# Patient Record
Sex: Female | Born: 1970 | Race: White | Hispanic: No | Marital: Single | Smoking: Former smoker
Health system: Southern US, Community
[De-identification: ages and names within clinical notes are randomized; demographics above are authoritative.]

## PROBLEM LIST (undated history)

## (undated) DIAGNOSIS — R51 Headache: Secondary | ICD-10-CM

## (undated) DIAGNOSIS — F319 Bipolar disorder, unspecified: Secondary | ICD-10-CM

## (undated) DIAGNOSIS — F329 Major depressive disorder, single episode, unspecified: Secondary | ICD-10-CM

## (undated) DIAGNOSIS — G8929 Other chronic pain: Secondary | ICD-10-CM

## (undated) DIAGNOSIS — R519 Headache, unspecified: Secondary | ICD-10-CM

## (undated) DIAGNOSIS — M1990 Unspecified osteoarthritis, unspecified site: Secondary | ICD-10-CM

## (undated) DIAGNOSIS — F419 Anxiety disorder, unspecified: Secondary | ICD-10-CM

## (undated) DIAGNOSIS — M549 Dorsalgia, unspecified: Secondary | ICD-10-CM

## (undated) DIAGNOSIS — F32A Depression, unspecified: Secondary | ICD-10-CM

## (undated) DIAGNOSIS — F1910 Other psychoactive substance abuse, uncomplicated: Secondary | ICD-10-CM

## (undated) HISTORY — PX: ARTHROSCOPIC REPAIR PCL: SUR81

## (undated) HISTORY — PX: DENTAL SURGERY: SHX609

## (undated) HISTORY — PX: NASAL SEPTUM SURGERY: SHX37

---

## 1999-12-07 ENCOUNTER — Encounter: Payer: Self-pay | Admitting: Emergency Medicine

## 1999-12-07 ENCOUNTER — Emergency Department (HOSPITAL_COMMUNITY): Admission: EM | Admit: 1999-12-07 | Discharge: 1999-12-07 | Payer: Self-pay | Admitting: Emergency Medicine

## 2000-02-16 ENCOUNTER — Other Ambulatory Visit: Admission: RE | Admit: 2000-02-16 | Discharge: 2000-02-16 | Payer: Self-pay | Admitting: *Deleted

## 2000-07-10 ENCOUNTER — Other Ambulatory Visit: Admission: RE | Admit: 2000-07-10 | Discharge: 2000-07-10 | Payer: Self-pay | Admitting: *Deleted

## 2000-11-15 ENCOUNTER — Observation Stay (HOSPITAL_COMMUNITY): Admission: EM | Admit: 2000-11-15 | Discharge: 2000-11-16 | Payer: Self-pay | Admitting: Emergency Medicine

## 2001-02-07 ENCOUNTER — Other Ambulatory Visit (HOSPITAL_COMMUNITY): Admission: RE | Admit: 2001-02-07 | Discharge: 2001-02-11 | Payer: Self-pay | Admitting: Psychiatry

## 2001-02-08 ENCOUNTER — Emergency Department (HOSPITAL_COMMUNITY): Admission: EM | Admit: 2001-02-08 | Discharge: 2001-02-08 | Payer: Self-pay | Admitting: Emergency Medicine

## 2002-02-27 ENCOUNTER — Other Ambulatory Visit: Admission: RE | Admit: 2002-02-27 | Discharge: 2002-02-27 | Payer: Self-pay | Admitting: Family Medicine

## 2003-04-22 ENCOUNTER — Other Ambulatory Visit: Admission: RE | Admit: 2003-04-22 | Discharge: 2003-04-22 | Payer: Self-pay | Admitting: Family Medicine

## 2004-05-25 ENCOUNTER — Other Ambulatory Visit: Admission: RE | Admit: 2004-05-25 | Discharge: 2004-05-25 | Payer: Self-pay | Admitting: Family Medicine

## 2005-05-29 ENCOUNTER — Other Ambulatory Visit: Admission: RE | Admit: 2005-05-29 | Discharge: 2005-05-29 | Payer: Self-pay | Admitting: Family Medicine

## 2006-10-01 ENCOUNTER — Other Ambulatory Visit: Admission: RE | Admit: 2006-10-01 | Discharge: 2006-10-01 | Payer: Self-pay | Admitting: Family Medicine

## 2006-11-04 ENCOUNTER — Emergency Department (HOSPITAL_COMMUNITY): Admission: EM | Admit: 2006-11-04 | Discharge: 2006-11-04 | Payer: Self-pay | Admitting: Family Medicine

## 2007-12-05 ENCOUNTER — Emergency Department (HOSPITAL_COMMUNITY): Admission: EM | Admit: 2007-12-05 | Discharge: 2007-12-06 | Payer: Self-pay | Admitting: Emergency Medicine

## 2008-04-14 ENCOUNTER — Other Ambulatory Visit: Admission: RE | Admit: 2008-04-14 | Discharge: 2008-04-14 | Payer: Self-pay | Admitting: Family Medicine

## 2008-07-10 ENCOUNTER — Emergency Department (HOSPITAL_COMMUNITY): Admission: EM | Admit: 2008-07-10 | Discharge: 2008-07-10 | Payer: Self-pay | Admitting: Emergency Medicine

## 2009-06-21 ENCOUNTER — Other Ambulatory Visit: Admission: RE | Admit: 2009-06-21 | Discharge: 2009-06-21 | Payer: Self-pay | Admitting: Family Medicine

## 2010-02-04 ENCOUNTER — Emergency Department (HOSPITAL_COMMUNITY): Admission: EM | Admit: 2010-02-04 | Discharge: 2010-02-04 | Payer: Self-pay | Admitting: Emergency Medicine

## 2010-10-08 LAB — COMPREHENSIVE METABOLIC PANEL
ALT: 15 U/L (ref 0–35)
AST: 20 U/L (ref 0–37)
Albumin: 4.4 g/dL (ref 3.5–5.2)
Alkaline Phosphatase: 40 U/L (ref 39–117)
BUN: 15 mg/dL (ref 6–23)
CO2: 28 mEq/L (ref 19–32)
Calcium: 9.3 mg/dL (ref 8.4–10.5)
Chloride: 109 mEq/L (ref 96–112)
Creatinine, Ser: 0.91 mg/dL (ref 0.4–1.2)
GFR calc Af Amer: >60 mL/min (ref >60)
GFR calc non Af Amer: >60 mL/min (ref >60)
Glucose, Bld: 99 mg/dL (ref 70–99)
Potassium: 3.8 mEq/L (ref 3.5–5.1)
Sodium: 140 mEq/L (ref 135–145)
Total Bilirubin: 0.5 mg/dL (ref 0.3–1.2)
Total Protein: 7.1 g/dL (ref 6.0–8.3)

## 2010-10-08 LAB — CBC
HCT: 36.4 % (ref 36.0–46.0)
Hemoglobin: 12.4 g/dL (ref 12.0–15.0)
MCH: 33.6 pg (ref 26.0–34.0)
MCHC: 34.1 g/dL (ref 30.0–36.0)
MCV: 98.7 fL (ref 78.0–100.0)
Platelets: 149 10*3/uL — ABNORMAL LOW (ref 150–400)
RBC: 3.69 MIL/uL — ABNORMAL LOW (ref 3.87–5.11)
RDW: 13.1 % (ref 11.5–15.5)
WBC: 5 10*3/uL (ref 4.0–10.5)

## 2010-10-08 LAB — DIFFERENTIAL
Basophils Absolute: 0 10*3/uL (ref 0.0–0.1)
Basophils Relative: 1 % (ref 0–1)
Eosinophils Absolute: 0.1 10*3/uL (ref 0.0–0.7)
Eosinophils Relative: 2 % (ref 0–5)
Lymphocytes Relative: 31 % (ref 12–46)
Lymphs Abs: 1.6 10*3/uL (ref 0.7–4.0)
Monocytes Absolute: 0.4 10*3/uL (ref 0.1–1.0)
Monocytes Relative: 8 % (ref 3–12)
Neutro Abs: 2.9 10*3/uL (ref 1.7–7.7)
Neutrophils Relative %: 58 % (ref 43–77)

## 2010-10-08 LAB — URINALYSIS, ROUTINE W REFLEX MICROSCOPIC
Bilirubin Urine: NEGATIVE
Glucose, UA: NEGATIVE mg/dL
Hgb urine dipstick: NEGATIVE
Ketones, ur: NEGATIVE mg/dL
Nitrite: NEGATIVE
Protein, ur: NEGATIVE mg/dL
Specific Gravity, Urine: 1.008 (ref 1.005–1.030)
Urobilinogen, UA: 0.2 mg/dL (ref 0.0–1.0)
pH: 6.5 (ref 5.0–8.0)

## 2010-10-08 LAB — POCT PREGNANCY, URINE: Preg Test, Ur: NEGATIVE

## 2010-11-07 ENCOUNTER — Ambulatory Visit (HOSPITAL_COMMUNITY)
Admission: RE | Admit: 2010-11-07 | Discharge: 2010-11-07 | Disposition: A | Discharge status: Home / Self Care | Payer: Managed Care, Other (non HMO) | Attending: Psychiatry | Admitting: Psychiatry

## 2010-11-07 DIAGNOSIS — F1920 Other psychoactive substance dependence, uncomplicated: Secondary | ICD-10-CM | POA: Insufficient documentation

## 2010-12-09 NOTE — Discharge Summary (Signed)
[facility]  Patient:    Alicia Logan, Alicia Logan Visit Number: [id number] MRN: [id number]          Service Type: PSY Location: [facility] Attending Physician:  Benjaman Pott Dictated by:   Ammie Dalton, M.D. Adm. Date:  02/07/2001 Disc. Date: 02/11/2001                             Discharge Summary  DISCHARGE DIAGNOSES: 1. Depakote overdose. 2. Drug abuse. 3. Alcohol abuse. 4. Suicidal attempt. 5. Tobacco abuse.  HOSPITAL COURSE:  The patient was admitted for 1-to-1 suicidal observation because her Depakote level was elevated on admission and needed to be at least reducing before she could be discharged, and the patient would not contract for safety and needed 1-to-1 observation.  She was very argumentative and upset during her hospitalization.  She was concerned that myself or the nursing staff was talking with family members, or her boyfriend or friends about her medical condition without her knowledge, which was not the case with this provider.  She did not understand why she had to continue to be in the hospital.  She was also upset because she wanted to smoke, and had to have someone go out with her for observation.  Her Depakote level did drop, and all other laboratory values were normal.  It was arranged with Dr. Jeanie Sewer of psychiatry that she would undergo voluntary or unvoluntary commitment for continued suicidal concerns to Behavioral Health.  He was making the arrangements for her discharge.  FOLLOWUP:  The patient will follow up with me afterwards.  I did not continue any medications at this time because I wanted those to be further evaluated by psychiatry. Dictated by:   Ammie Dalton, M.D. Attending Physician:  Carolanne Grumbling D DD:  03/20/01 TD:  03/20/01 Job: [id number]

## 2010-12-09 NOTE — H&P (Signed)
[facility]  Patient:    GORDON, CARLSON                       MRN: [id number] Adm. Date:  [id number] Disc. Date: [id number] Attending:  Carolanne Grumbling D                         History and Physical  INCOMPLETE  HISTORY OF PRESENT ILLNESS:  This is an unfortunate, difficult 40 year old white female with psychiatric history of depression in the past who presented following an overdose.  She was found lethargic by her boyfriend.  She had been drinking and was intoxicated and took an overdose of Depakote. DD:  03/02/01 TD:  03/03/01 Job: [id number]

## 2010-12-09 NOTE — H&P (Signed)
[facility]  Patient:    Alicia Logan, Alicia Logan Visit Number: [id number] MRN: [id number]          Service Type: PSY Location: [facility] Attending Physician:  Benjaman Pott Dictated by:   Ammie Dalton, M.D. Adm. Date:  02/07/2001 Disc. Date: 02/11/2001                           History and Physical  HISTORY OF PRESENT ILLNESS:  This is an unfortunate 40 year old white female who I follow for primary care, who presents following a suicide attempt to the emergency room.  Apparently she had had an argument with her boyfriend and had taken 19 Depakote, 4 to 5 Klonopin following 5 mixed drinks, 1 beer and at least 1 shot of liquor.  She had no plan but when she did it, she did intend to kill herself.  Upon arrival to the emergency room, she was given activated charcoal.  Her initial Depakote level was 125.2 on admission, with an acetaminophen level of less than 10.  PAST MEDICAL HISTORY:  Seasonal allergies, childhood asthma, migraines, alcohol use, depression and anxiety, herpes simplex virus in past, insomnia.  MEDICATIONS: 1. Effexor 225 mg p.o. q.d. 2. Depo-Provera q.52mo. 3. BC powders as needed. 4. Xanax 0.5 mg p.o. q.h.s. 5. Imitrex as needed, none recently taken.  ALLERGIES:  PERCOCET, CODEINE and SULFA.  SOCIAL HISTORY:  Currently living part-time with boyfriend, supportive mother and grandmother.  Works full-time but having trouble with keeping hours due to depression and anxiety.  Patient is a smoker.  REVIEW OF SYSTEMS:  Patient is sleepy and unable to go through review of systems on admission but denies any acute problems other than feeling nauseated, sleepy, tried, still not contracting for safety.  PHYSICAL EXAMINATION:  VITAL SIGNS:  Temperature 98.5, respirations 20, pulse 85, blood pressure 110/76.  HEENT:  Rankin/AT, EOMI, PERRLA, clear posterior pharynx, charcoal around mouth.  NECK:  Supple, without adenopathy, JVD, thyromegaly or  bruits.  CHEST:  Clear to auscultation and percussion.  CARDIOVASCULAR:  Regular rate without murmur, gallop or rub.  ABDOMEN:  Soft, nontender, nondistended.  Bowel sounds positive.  EXTREMITIES:  Without cyanosis, clubbing or edema.  LABORATORY DATA:  Depakote level 125.4.  Urine pregnancy test is negative. WBC 6.2, hemoglobin 12.7, platelets 194,000.  Sodium 143, potassium 3.5, BUN 11 and creatinine 1.1.  LFTs normal.  Urine drug screen positive for alcohol, phencyclidine and positive for cannabinoids, otherwise, negative.  ASSESSMENT AND PLAN:  This is an unfortunate 40 year old white female with a history of depression and anxiety who presents following a suicidal attempt with Depakote, Klonopin and alcohol and other drug use.  Will admit for observation on telemetry and one-to-one nursing care.  Patient will not contract for safety.  Will consult psychiatry for consideration of involuntary commitment if patient will not agree to go voluntarily, since she will not contract for safety.  Will continue to follow Depakote levels for reduction. Dictated by:   Ammie Dalton, M.D. Attending Physician:  Carolanne Grumbling D DD:  03/20/01 TD:  03/20/01 Job: [phone] [id number]

## 2011-01-11 ENCOUNTER — Other Ambulatory Visit: Payer: Self-pay | Admitting: Family Medicine

## 2011-01-11 ENCOUNTER — Other Ambulatory Visit (HOSPITAL_COMMUNITY)
Admission: RE | Admit: 2011-01-11 | Discharge: 2011-01-11 | Disposition: A | Discharge status: Home / Self Care | Payer: Managed Care, Other (non HMO) | Source: Ambulatory Visit | Attending: Family Medicine | Admitting: Family Medicine

## 2011-01-11 DIAGNOSIS — Z124 Encounter for screening for malignant neoplasm of cervix: Secondary | ICD-10-CM | POA: Insufficient documentation

## 2011-04-19 LAB — DIFFERENTIAL
Basophils Absolute: 0
Basophils Relative: 0
Eosinophils Absolute: 0
Eosinophils Relative: 0
Lymphocytes Relative: 35
Lymphs Abs: 2.4
Monocytes Absolute: 0.4
Monocytes Relative: 6
Neutro Abs: 4.1
Neutrophils Relative %: 59

## 2011-04-19 LAB — COMPREHENSIVE METABOLIC PANEL
ALT: 16
AST: 21
Albumin: 3.8
Alkaline Phosphatase: 34 — ABNORMAL LOW
BUN: 12
CO2: 26
Calcium: 9.1
Chloride: 109
Creatinine, Ser: 1.01
GFR calc Af Amer: >60
GFR calc non Af Amer: >60
Glucose, Bld: 88
Potassium: 3.9
Sodium: 143
Total Bilirubin: 0.8
Total Protein: 6.6

## 2011-04-19 LAB — URINALYSIS, ROUTINE W REFLEX MICROSCOPIC
Bilirubin Urine: NEGATIVE
Glucose, UA: NEGATIVE
Hgb urine dipstick: NEGATIVE
Leukocytes, UA: NEGATIVE
Nitrite: NEGATIVE
Protein, ur: 30 — AB
Specific Gravity, Urine: 1.034 — ABNORMAL HIGH
Urobilinogen, UA: 1
pH: 8

## 2011-04-19 LAB — POCT PREGNANCY, URINE
Operator id: 29011
Preg Test, Ur: NEGATIVE

## 2011-04-19 LAB — CBC
HCT: 36.3
Hemoglobin: 12.4
MCHC: 34.3
MCV: 95.4
Platelets: 150
RBC: 3.81 — ABNORMAL LOW
RDW: 15.4
WBC: 6.8

## 2011-04-19 LAB — URINE MICROSCOPIC-ADD ON

## 2011-06-26 ENCOUNTER — Encounter
Payer: Managed Care, Other (non HMO) | Attending: Physical Medicine and Rehabilitation | Admitting: Physical Medicine and Rehabilitation

## 2011-06-26 DIAGNOSIS — M545 Low back pain, unspecified: Secondary | ICD-10-CM | POA: Insufficient documentation

## 2011-06-26 DIAGNOSIS — G894 Chronic pain syndrome: Secondary | ICD-10-CM

## 2011-06-26 DIAGNOSIS — M25559 Pain in unspecified hip: Secondary | ICD-10-CM | POA: Insufficient documentation

## 2011-06-26 DIAGNOSIS — R634 Abnormal weight loss: Secondary | ICD-10-CM | POA: Insufficient documentation

## 2011-06-27 NOTE — Progress Notes (Signed)
HISTORY OF PRESENT ILLNESS:  Ms. Alicia Logan is a pleasant 40 year old single female who lives independently.  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  She was referred by Dr. Quintella Logan for chronic low back pain.  Ms. Alicia Logan states that she has a 1-1/2 year history of predominantly right low back pain.  She believes it came on rather suddenly but has waxed and waned for about the first year, however, over the last 6 months, it has been rather constant.  She reports a history of a fall approximately 2 years ago about 6 months prior to her onset of low back pain when she slipped on the bath tub and fractured ribs.  No other trauma is noted by the patient.  She reports the pain as a constant aching, sharp and stabbing in nature mainly localized to the right thoracolumbar junction region about 8 or 9 on a scale of 10, she indicates the severity.  Sleep is fair.  She is not really sure what makes her pain worse.  She does report rest, heat, medication, and injections seem to help a little bit.  The caudal injection she had one seem to be of some assistance for her.  She reports fair relief with current meds.  She is using hydrocodone 10/325, 5-6 a day.  FUNCTIONAL STATUS:  She can walk 30 minutes, plus she is able to climb stairs and drive.  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  REVIEW OF SYSTEMS:  She reports weakness, tingling, spasms, dizziness, depression, anxiety.  Denies suicidal ideation.  Also reports fevers, night sweats, weight loss.  She notes a 40-pound weight loss.  I have asked her to follow up with primary care with this history and to let them know she has had such a significant weight loss. She also reports occasional diarrhea, abdominal pain, poor appetite.  She has an old MRI report attached to the chart dated October 27, 2009, which was read by Dr. Alfredo Logan noting most prominent left-sided disease. There is a left paracentral protrusion at L4-5 with mild narrowing  of the left lateral recess potentially affecting left L5 root and also far left lateral disc bulge at L3-4 with minimal impact on left lateral recess and inferior recess of the left foramen.  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  Ms. Alicia Logan has seen many physicians here in [facility] including Dr. Jorge Logan, Dr. Alvester Logan, Dr. Newell Logan.  She has seen chiropractors and has been to physical therapy.  She tells me she has had multiple spine injections although she cannot tell me the exact times of injection. She believes she has had a caudal epidural as well as radiofrequency. The radiofrequency did not help that much but caudal epidural may have given her a little bit of relief.  When she saw Dr. Newell Logan, she tells me that he did not recommend surgery at the time of her visit with him. She has been through physical therapy, and seen chiropractors.  She has trialed a TENS unit, as well as acupuncture.  PAST MEDICAL HISTORY:  Significant for a long history of narcotic use and history of suicide attempt.  She indicates she does not drink alcohol at this time.  FAMILY HISTORY:  Significant for diabetes, high blood pressure, psychiatric problems, and disability.  PHYSICAL EXAMINATION:  VITAL SIGNS:  Today, her blood pressure is 120/72, pulse 93, respirations 16, 100% saturated on room air. GENERAL:  She is a thin adult female  who is 5 feet 9 and 121 pounds. She is oriented x3.  Speech is clear.  Her affect is overall bright, but she does become tearful at points during our interview. NEURO:  She is oriented x3.  Speech is clear.  She follows commands without difficulty.  Answers my questions appropriately.  Cranial nerves and coordination are grossly intact.  Her reflexes are slightly brisk in upper and lower extremities with negative Hoffmann sign, negative clonus.  She has good strength in both upper and lower extremities.  No abnormal tone is  appreciated.  She has intact sensation to light touch and pinprick in the lower extremities.  Straight leg raise is negative.  She transitions easily from sitting to standing.  Gait is not antalgic. Tandem gait.  Romberg tests are all performed adequately.  Heel-toe walking is performed adequately.  Forward flexion of the lumbar spine does not aggravate pain, and she has relatively well-preserved motion. Extension does seem to aggravate her low back, especially on the right, and especially if it is combined with extension and rotation.  Palpation down the cervical, thoracic, lumbar spine, spinous processes does not aggravate any pain.  She does not appear to have costovertebral angle tenderness with percussion.  IMPRESSION:  Probable mechanical low back pain.  The patient is complaining of low back pain radiation to the right hip.  It has gotten worse over the last 6 months.  It is constant now.  She reports a 40- pound weight loss and night sweats.  We will obtain a repeat MRI.  It has been 1-1/2 years since she has had her lumbar MRI.  We will hold off on medications at this time due to her history, will be anticipating nonnarcotic management.  I will see her back after the scan.     Alicia Logan, M.D. Electronically Signed    DMK/MedQ D:  06/26/2011 13:27:23  T:  06/27/2011 01:42:56  Job #:  [id number]

## 2011-07-03 ENCOUNTER — Other Ambulatory Visit: Payer: Self-pay | Admitting: Physical Medicine and Rehabilitation

## 2011-07-03 DIAGNOSIS — M545 Low back pain, unspecified: Secondary | ICD-10-CM

## 2011-07-03 DIAGNOSIS — M5416 Radiculopathy, lumbar region: Secondary | ICD-10-CM

## 2011-07-03 DIAGNOSIS — R634 Abnormal weight loss: Secondary | ICD-10-CM

## 2011-08-04 ENCOUNTER — Ambulatory Visit: Payer: Managed Care, Other (non HMO) | Admitting: Physical Medicine and Rehabilitation

## 2011-09-25 ENCOUNTER — Other Ambulatory Visit: Payer: Self-pay | Admitting: Geriatric Medicine

## 2011-09-25 DIAGNOSIS — M545 Low back pain, unspecified: Secondary | ICD-10-CM

## 2011-09-27 ENCOUNTER — Ambulatory Visit
Admission: RE | Admit: 2011-09-27 | Discharge: 2011-09-27 | Disposition: A | Discharge status: Home / Self Care | Payer: Managed Care, Other (non HMO) | Source: Ambulatory Visit | Attending: Geriatric Medicine | Admitting: Geriatric Medicine

## 2011-09-27 DIAGNOSIS — M545 Low back pain, unspecified: Secondary | ICD-10-CM

## 2011-11-26 ENCOUNTER — Ambulatory Visit (INDEPENDENT_AMBULATORY_CARE_PROVIDER_SITE_OTHER): Payer: Managed Care, Other (non HMO) | Admitting: Family Medicine

## 2011-11-26 VITALS — BP 134/85 | HR 101 | Temp 98.7°F | Resp 18 | Ht 69.0 in | Wt 129.2 lb

## 2011-11-26 DIAGNOSIS — R5381 Other malaise: Secondary | ICD-10-CM

## 2011-11-26 DIAGNOSIS — R509 Fever, unspecified: Secondary | ICD-10-CM

## 2011-11-26 DIAGNOSIS — B349 Viral infection, unspecified: Secondary | ICD-10-CM | POA: Insufficient documentation

## 2011-11-26 DIAGNOSIS — F32A Depression, unspecified: Secondary | ICD-10-CM | POA: Insufficient documentation

## 2011-11-26 DIAGNOSIS — F329 Major depressive disorder, single episode, unspecified: Secondary | ICD-10-CM | POA: Insufficient documentation

## 2011-11-26 DIAGNOSIS — M545 Low back pain: Secondary | ICD-10-CM

## 2011-11-26 DIAGNOSIS — R5383 Other fatigue: Secondary | ICD-10-CM

## 2011-11-26 DIAGNOSIS — G8929 Other chronic pain: Secondary | ICD-10-CM

## 2011-11-26 LAB — POCT CBC
Granulocyte percent: 76 %G (ref 37–80)
HCT, POC: 42.8 % (ref 37.7–47.9)
Hemoglobin: 13.9 g/dL (ref 12.2–16.2)
Lymph, poc: 2.8 (ref 0.6–3.4)
MCH, POC: 32.5 pg — AB (ref 27–31.2)
MCHC: 32.5 g/dL (ref 31.8–35.4)
MCV: 99.9 fL — AB (ref 80–97)
MID (cbc): 0.7 (ref 0–0.9)
MPV: 8 fL (ref 0–99.8)
POC Granulocyte: 10.9 — AB (ref 2–6.9)
POC LYMPH PERCENT: 19.4 %L (ref 10–50)
POC MID %: 4.6 %M (ref 0–12)
Platelet Count, POC: 355 10*3/uL (ref 142–424)
RBC: 4.28 M/uL (ref 4.04–5.48)
RDW, POC: 15.4 %
WBC: 14.4 10*3/uL — AB (ref 4.6–10.2)

## 2011-11-26 LAB — POCT INFLUENZA A/B
Influenza A, POC: NEGATIVE
Influenza B, POC: NEGATIVE

## 2011-11-26 MED ORDER — DICLOFENAC SODIUM 75 MG PO TBEC: 75.0000 mg | DELAYED_RELEASE_TABLET | Freq: Two times a day (BID) | ORAL | Status: DC

## 2011-11-26 MED ORDER — AZITHROMYCIN 250 MG PO TABS: ORAL_TABLET | ORAL | Status: DC

## 2011-11-26 NOTE — Progress Notes (Signed)
  Subjective:    Patient ID: Alicia Logan, female    DOB: 1970-11-24, 41 y.o.   MRN: [id number]  HPI  2 day history of fever, chills and myalgias.  Smoker   Review of Systems  Constitutional: Positive for activity change, appetite change and fatigue.  HENT: Positive for congestion, rhinorrhea and postnasal drip. Negative for ear pain and facial swelling.   Respiratory: Positive for cough. Negative for shortness of breath and wheezing.        Objective:   Physical Exam  Constitutional: She appears well-developed.  HENT:  Right Ear: External ear normal.  Left Ear: External ear normal.       Inflamed with clear rhinorrhea  Neck: Neck supple. No thyromegaly present.  Cardiovascular: Normal rate, regular rhythm and normal heart sounds.   Pulmonary/Chest: Effort normal and breath sounds normal.  Lymphadenopathy:    Cervical adenopathy: shoddy anterior nodes.  Neurological: She is alert.  Skin: Skin is warm.    Results for orders placed in visit on 11/26/11  POCT CBC      Component Value Range   WBC 14.4 (*) 4.6 - 10.2 (K/uL)   Lymph, poc 2.8  0.6 - 3.4    POC LYMPH PERCENT 19.4  10 - 50 (%L)   MID (cbc) 0.7  0 - 0.9    POC MID % 4.6  0 - 12 (%M)   POC Granulocyte 10.9 (*) 2 - 6.9    Granulocyte percent 76.0  37 - 80 (%G)   RBC 4.28  4.04 - 5.48 (M/uL)   Hemoglobin 13.9  12.2 - 16.2 (g/dL)   HCT, POC 14.7  82.9 - 47.9 (%)   MCV 99.9 (*) 80 - 97 (fL)   MCH, POC 32.5 (*) 27 - 31.2 (pg)   MCHC 32.5  31.8 - 35.4 (g/dL)   RDW, POC 56.2     Platelet Count, POC 355.0  142 - 424 (K/uL)   MPV 8.0  0 - 99.8 (fL)  POCT INFLUENZA A/B      Component Value Range   Influenza A, POC Negative     Influenza B, POC Negative         Assessment & Plan:   1. Viral illness  POCT CBC, POCT Influenza A/B  2. Depression    3. Chronic lower back pain    4. Fever; leucocytosis diclofenac (VOLTAREN) 75 MG EC tablet, azithromycin (ZITHROMAX) 250 MG tablet,    Anticipatory guidance

## 2011-11-29 ENCOUNTER — Emergency Department (HOSPITAL_COMMUNITY)
Admission: EM | Admit: 2011-11-29 | Discharge: 2011-11-29 | Disposition: A | Discharge status: Home / Self Care | Payer: Managed Care, Other (non HMO) | Attending: Emergency Medicine | Admitting: Emergency Medicine

## 2011-11-29 DIAGNOSIS — S51809A Unspecified open wound of unspecified forearm, initial encounter: Secondary | ICD-10-CM | POA: Insufficient documentation

## 2011-11-29 DIAGNOSIS — F32A Depression, unspecified: Secondary | ICD-10-CM

## 2011-11-29 DIAGNOSIS — IMO0002 Reserved for concepts with insufficient information to code with codable children: Secondary | ICD-10-CM | POA: Insufficient documentation

## 2011-11-29 DIAGNOSIS — F329 Major depressive disorder, single episode, unspecified: Secondary | ICD-10-CM | POA: Insufficient documentation

## 2011-11-29 DIAGNOSIS — F411 Generalized anxiety disorder: Secondary | ICD-10-CM | POA: Insufficient documentation

## 2011-11-29 DIAGNOSIS — F3289 Other specified depressive episodes: Secondary | ICD-10-CM | POA: Insufficient documentation

## 2011-11-29 DIAGNOSIS — X789XXA Intentional self-harm by unspecified sharp object, initial encounter: Secondary | ICD-10-CM | POA: Insufficient documentation

## 2011-11-29 LAB — RAPID URINE DRUG SCREEN, HOSP PERFORMED
Amphetamines: NOT DETECTED
Barbiturates: NOT DETECTED
Benzodiazepines: NOT DETECTED
Cocaine: NOT DETECTED
Opiates: POSITIVE — AB
Tetrahydrocannabinol: NOT DETECTED

## 2011-11-29 LAB — COMPREHENSIVE METABOLIC PANEL
ALT: 14 U/L (ref 0–35)
AST: 28 U/L (ref 0–37)
Albumin: 3.8 g/dL (ref 3.5–5.2)
Alkaline Phosphatase: 68 U/L (ref 39–117)
BUN: 15 mg/dL (ref 6–23)
CO2: 24 mEq/L (ref 19–32)
Calcium: 9 mg/dL (ref 8.4–10.5)
Chloride: 105 mEq/L (ref 96–112)
Creatinine, Ser: 0.86 mg/dL (ref 0.50–1.10)
GFR calc Af Amer: >90 mL/min (ref >90)
GFR calc non Af Amer: 84 mL/min — ABNORMAL LOW (ref >90)
Glucose, Bld: 120 mg/dL — ABNORMAL HIGH (ref 70–99)
Potassium: 3.7 mEq/L (ref 3.5–5.1)
Sodium: 141 mEq/L (ref 135–145)
Total Bilirubin: 0.3 mg/dL (ref 0.3–1.2)
Total Protein: 7.1 g/dL (ref 6.0–8.3)

## 2011-11-29 LAB — CBC
HCT: 35.4 % — ABNORMAL LOW (ref 36.0–46.0)
Hemoglobin: 12.3 g/dL (ref 12.0–15.0)
MCH: 33.5 pg (ref 26.0–34.0)
MCHC: 34.7 g/dL (ref 30.0–36.0)
MCV: 96.5 fL (ref 78.0–100.0)
Platelets: 277 10*3/uL (ref 150–400)
RBC: 3.67 MIL/uL — ABNORMAL LOW (ref 3.87–5.11)
RDW: 14.5 % (ref 11.5–15.5)
WBC: 10 10*3/uL (ref 4.0–10.5)

## 2011-11-29 LAB — ETHANOL: Alcohol, Ethyl (B): 199 mg/dL — ABNORMAL HIGH (ref 0–11)

## 2011-11-29 MED ORDER — NICOTINE 21 MG/24HR TD PT24
21.0000 mg | MEDICATED_PATCH | Freq: Every day | TRANSDERMAL | Status: DC
  Administered 2011-11-29: 21 mg via TRANSDERMAL
  Filled 2011-11-29: qty 1

## 2011-11-29 MED ORDER — ACETAMINOPHEN 325 MG PO TABS: 650.0000 mg | ORAL_TABLET | ORAL | Status: DC | PRN

## 2011-11-29 MED ORDER — LORAZEPAM 1 MG PO TABS
1.0000 mg | ORAL_TABLET | Freq: Three times a day (TID) | ORAL | Status: DC | PRN
  Administered 2011-11-29: 1 mg via ORAL
  Filled 2011-11-29: qty 1

## 2011-11-29 MED ORDER — ONDANSETRON HCL 4 MG PO TABS: 4.0000 mg | ORAL_TABLET | Freq: Three times a day (TID) | ORAL | Status: DC | PRN

## 2011-11-29 MED ORDER — IBUPROFEN 600 MG PO TABS: 600.0000 mg | ORAL_TABLET | Freq: Three times a day (TID) | ORAL | Status: DC | PRN

## 2011-11-29 NOTE — ED Provider Notes (Signed)
Pt was seen by psychiatrist.  He thinks pt is not suicidal.  Had no real intent at self harm.  I went back to see pt and confirmed that she is not suicidal.   Will release  Cheri Guppy, MD 11/29/11 2136
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
## 2011-11-29 NOTE — ED Notes (Signed)
Pt was seen by tele-psych and cleared for discharge. EDP notified and is in agreement with disposition. RN made aware. Pt is able to contract for safety and agrees to follow up with her outpatient psychiatrist Dr. Evelene Croon. Pt was provided information on mobile crisis services, [place] along with alternate psychiatrist/therapists in the area. CSW offered pt a bus pass though pt refused stating she will find her own way home. No further needs identified at this time.
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
## 2011-11-29 NOTE — Discharge Instructions (Signed)
Continue your current medications.  Return for any thoughts of harming yourself or others.  Followup with your Dr. for reevaluation

## 2011-11-29 NOTE — ED Notes (Addendum)
Pt stated that she does not want to see her father and he is not allowed to visit.  I told her to let us know if she changes her mind and she stated that she would not be changing her mind.  I informed the Psych ED nurses of her decision.

## 2011-11-29 NOTE — ED Notes (Signed)
Watched patient change into scrubs and collect urine. Pt wanded and belongings searched by security. One pt belonging bag in locker #A in triage.

## 2011-11-29 NOTE — ED Notes (Signed)
Patient very upset w/family esp father about coming to see her, they later went to dinner and she called father and blessed him out on his phone, father came to see patient but patient had already informed staff not to allow father back here to see her, she does not want to see him.

## 2011-11-29 NOTE — ED Provider Notes (Signed)
Medical screening examination/treatment/procedure(s) were performed by non-physician practitioner and as supervising physician I was immediately available for consultation/collaboration.   Lyanne Co, MD 11/29/11 [phone]

## 2011-11-29 NOTE — ED Provider Notes (Signed)
History     CSN: [id number]  Arrival date & time 11/29/11  1433   First MD Initiated Contact with Patient 11/29/11 1536      Chief Complaint  Patient presents with  . Suicidal  . Alcohol Intoxication    (Consider location/radiation/quality/duration/timing/severity/associated sxs/prior treatment) HPI  Patient presents to ER by [facility] after police were called to patient's home with concern that patient was going to harm herself with patient reporting to neighbor that she was depressed and cut herself. Patient states she has been very sad about numerous events and has been drinking alcohol throughout the night and the day and then became even more sad and took a knife and cut her right arm. Patient states she did this "to make myself feel better" and states "I was not trying to kill my self, I'm just really stressed." Patient denies HI. Patient states she has remote hx of inpatient psychiatric stay when she was in her 66s. She denies hallucinations. Patient states she has a number of life stressors currently and has been "sick with a cold and I just don't feel good." patient denies fevers, chills, HA, neck stiffness, CP, SOB, abdominal pain, n/v/d.   No past medical history on file.  No past surgical history on file.  No family history on file.  History  Substance Use Topics  . Smoking status: Current Everyday Smoker  . Smokeless tobacco: Not on file   Comment: 1/2 pack per day  . Alcohol Use: Not on file    OB History    Grav Para Term Preterm Abortions TAB SAB Ect Mult Living                  Review of Systems  All other systems reviewed and are negative.    Allergies  Sulfa antibiotics  Home Medications   Current Outpatient Rx  Name Route Sig Dispense Refill  . ALPRAZOLAM 1 MG PO TABS Oral Take 1 mg by mouth 4 (four) times daily as needed. For anxiety.    . AMPHETAMINE-DEXTROAMPHETAMINE 10 MG PO TABS Oral Take 5 mg by mouth 3 (three) times daily.    . AZITHROMYCIN  250 MG PO TABS Oral Take 250-500 mg by mouth daily. Started 11/26/11; 5 day course of therapy; 2 tab on Day 1, 1 tab on Days 2-5    . CETIRIZINE HCL 10 MG PO TABS Oral Take 10 mg by mouth daily.    . CYANOCOBALAMIN 1000 MCG/ML IJ SOLN Intramuscular Inject 1,000 mcg into the muscle every 14 (fourteen) days.    Marland Kitchen DICLOFENAC SODIUM 75 MG PO TBEC Oral Take 1 tablet (75 mg total) by mouth 2 (two) times daily. 30 tablet 0  . DULOXETINE HCL 60 MG PO CPEP Oral Take 120 mg by mouth daily.     Marland Kitchen HYDROCODONE-ACETAMINOPHEN 10-500 MG PO TABS Oral Take 1 tablet by mouth every 4 (four) hours as needed. For pain.    Marland Kitchen MEDROXYPROGESTERONE ACETATE 150 MG/ML IM SUSP Intramuscular Inject 150 mg into the muscle every 3 (three) months.      BP 121/81  Pulse 113  Temp(Src) 99.2 F (37.3 C) (Oral)  Resp 16  SpO2 96%  Physical Exam  Nursing note and vitals reviewed. Constitutional: She is oriented to person, place, and time. She appears well-developed and well-nourished. No distress.  HENT:  Head: Normocephalic and atraumatic.  Eyes: Conjunctivae and EOM are normal. Pupils are equal, round, and reactive to light.  Neck: Normal range of motion. Neck supple.  Cardiovascular: Normal rate, regular rhythm, normal heart sounds and intact distal pulses.  Exam reveals no gallop and no friction rub.   No murmur heard. Pulmonary/Chest: Effort normal and breath sounds normal. No respiratory distress. She has no wheezes. She has no rales. She exhibits no tenderness.  Abdominal: Soft. Bowel sounds are normal. She exhibits no distension and no mass. There is no tenderness. There is no rebound and no guarding.  Musculoskeletal: Normal range of motion. She exhibits no edema and no tenderness.  Neurological: She is alert and oriented to person, place, and time.  Skin: Skin is warm and dry. No rash noted. She is not diaphoretic. No erythema.       Two linear superficial lacerations of right forearm that are hemastatic and do not  require closure given superficial nature.   Psychiatric: Her speech is normal. Judgment normal. Her mood appears anxious. She is agitated. Cognition and memory are normal. She expresses no homicidal and no suicidal ideation. She expresses no suicidal plans and no homicidal plans.       Patient with mild slurring of speech and very anxious appearing and tearful stating "I don't need to be here. I'm not crazy and I'm not suicidal."     ED Course  Procedures (including critical care time)  Tele psych requested and psych holding orders written. Consult to ACT team   Labs Reviewed  CBC - Abnormal; Notable for the following:    RBC 3.67 (*)    HCT 35.4 (*)    All other components within normal limits  COMPREHENSIVE METABOLIC PANEL - Abnormal; Notable for the following:    Glucose, Bld 120 (*)    GFR calc non Af Amer 84 (*)    All other components within normal limits  URINE RAPID DRUG SCREEN (HOSP PERFORMED) - Abnormal; Notable for the following:    Opiates POSITIVE (*)    All other components within normal limits  ETHANOL - Abnormal; Notable for the following:    Alcohol, Ethyl (B) 199 (*)    All other components within normal limits   No results found.   1. Depression       MDM  Pending tele psych for dispo        Jenness [place], [place] 11/29/11 2239

## 2011-11-29 NOTE — ED Notes (Signed)
After discharge papers are reviewed, pt states that she is feeling very poorly and has been sick for 4 days. VSS. Pt is informed that if she is sick she can stay and be seen by the EDP. Pt states, "Hell no! I am getting out of here and going home. It's just cold symptoms. I can recover from that at home!" Pt denies any further needs and requests a wheelchair ride to the front entrance because she is tired.

## 2011-11-30 ENCOUNTER — Ambulatory Visit (INDEPENDENT_AMBULATORY_CARE_PROVIDER_SITE_OTHER): Payer: Managed Care, Other (non HMO) | Admitting: Family Medicine

## 2011-11-30 VITALS — BP 139/83 | HR 78 | Temp 98.1°F | Resp 16 | Ht 69.0 in | Wt 129.0 lb

## 2011-11-30 DIAGNOSIS — B9789 Other viral agents as the cause of diseases classified elsewhere: Secondary | ICD-10-CM

## 2011-11-30 DIAGNOSIS — F32A Depression, unspecified: Secondary | ICD-10-CM

## 2011-11-30 DIAGNOSIS — F341 Dysthymic disorder: Secondary | ICD-10-CM

## 2011-11-30 DIAGNOSIS — F1010 Alcohol abuse, uncomplicated: Secondary | ICD-10-CM

## 2011-11-30 DIAGNOSIS — J069 Acute upper respiratory infection, unspecified: Secondary | ICD-10-CM

## 2011-11-30 DIAGNOSIS — F329 Major depressive disorder, single episode, unspecified: Secondary | ICD-10-CM

## 2011-11-30 NOTE — Progress Notes (Signed)
  Subjective:    Patient ID: Alicia Logan, female    DOB: 03-23-1971, 41 y.o.   MRN: [id number]  HPI  Patient presents in follow up  Seen Tuesday by French Ana (PA) at Dr. Jearl Klinefelter office  Pain shot (? Nubain); B 12 and ? antibiotic injection  Admitted to [facility] for suicidality after "cutting" herself while intoxicated. Patient was later discharged. (See ED note and psychiatric clearance) Needs work note for missed days  Continues to have respiratory symptoms; runny nose/PND/cough Denies fever/SOB or wheezing  Review of Systems     Objective:   Physical Exam  Constitutional: She appears well-developed.  HENT:       Clear nasal discharge  Neck: Neck supple.  Cardiovascular: Normal rate, regular rhythm and normal heart sounds.   Pulmonary/Chest: Effort normal and breath sounds normal.  Abdominal: Soft. Bowel sounds are normal.  Skin:       Scratch marks on UE        Assessment & Plan:  Depression with self mutilating behavior ETOH URI  Note provided Patient to follow up with Dr. Evelene Croon in the next week.

## 2012-07-15 ENCOUNTER — Encounter (HOSPITAL_COMMUNITY): Payer: Self-pay | Admitting: *Deleted

## 2012-07-15 ENCOUNTER — Emergency Department (INDEPENDENT_AMBULATORY_CARE_PROVIDER_SITE_OTHER)
Admission: EM | Admit: 2012-07-15 | Discharge: 2012-07-15 | Disposition: A | Discharge status: Home / Self Care | Payer: Self-pay | Source: Home / Self Care | Attending: Emergency Medicine | Admitting: Emergency Medicine

## 2012-07-15 DIAGNOSIS — L259 Unspecified contact dermatitis, unspecified cause: Secondary | ICD-10-CM

## 2012-07-15 MED ORDER — DOXYCYCLINE HYCLATE 100 MG PO TABS: 100.0000 mg | ORAL_TABLET | Freq: Two times a day (BID) | ORAL | Status: DC

## 2012-07-15 MED ORDER — PREDNISONE 10 MG PO TABS: ORAL_TABLET | ORAL | Status: DC

## 2012-07-15 MED ORDER — TRIAMCINOLONE ACETONIDE 0.1 % EX CREA: TOPICAL_CREAM | Freq: Three times a day (TID) | CUTANEOUS | Status: DC

## 2012-07-15 NOTE — ED Notes (Signed)
Pt  Has   A  Rad=sh   On  Both  Ear  Lobes   X  10  Days         She  Reports   She  Has       Been  Applying  neospsorin  To  The  Affected    Earlobes           And      She   Does  Not  Know  Any  Known  Causative  Agents

## 2012-07-15 NOTE — ED Notes (Signed)
Waiting discharge papers 

## 2012-07-15 NOTE — ED Notes (Signed)
She   States  She  Is  Allergic  To  An  Unknown  Anti  Biotic

## 2012-07-15 NOTE — ED Provider Notes (Signed)
Chief Complaint  Patient presents with  . Rash    History of Present Illness:   Alicia Logan is a 41 year old female who has had a one-week history of a rash on both of her ear lobes, right worse than left. This extends to the neck, itches, is cracking, losing, and painful. She denies any rash elsewhere. She used a new face cream before this broke out. She cannot think of any other obvious contactants.  Review of Systems:  Other than noted above, the patient denies any of the following symptoms: Systemic:  No fever, chills, sweats, weight loss, or fatigue. ENT:  No nasal congestion, rhinorrhea, sore throat, swelling of lips, tongue or throat. Resp:  No cough, wheezing, or shortness of breath. Skin:  No rash, itching, nodules, or suspicious lesions.  PMFSH:  Past medical history, family history, social history, meds, and allergies were reviewed.  Physical Exam:   Vital signs:  BP 139/91  Pulse 90  Temp 98.9 F (37.2 C) (Oral)  Resp 18  SpO2 100% Gen:  Alert, oriented, in no distress. ENT:  Pharynx clear, no intraoral lesions, moist mucous membranes. Lungs:  Clear to auscultation. Skin:  There is swelling, erythema, cracking, and bruising over both ear lobes, right worse than left. The right side this extends to the skin in front of, below, and behind the earlobe as well. There is no rash inside the ear and ear canals and TMs are normal.  Assessment:  The encounter diagnosis was Contact dermatitis.  Plan:   1.  The following meds were prescribed:   New Prescriptions   DOXYCYCLINE (VIBRA-TABS) 100 MG TABLET    Take 1 tablet (100 mg total) by mouth 2 (two) times daily.   PREDNISONE (DELTASONE) 10 MG TABLET    Take 4 tabs daily for 4 days, 3 tabs daily for 4 days, 2 tabs daily for 4 days, then 1 tab daily for 4 days.   TRIAMCINOLONE CREAM (KENALOG) 0.1 %    Apply topically 3 (three) times daily.   2.  The patient was instructed in symptomatic care and handouts were given. 3.  The patient  was told to return if becoming worse in any way, if no better in 3 or 4 days, and given some red flag symptoms that would indicate earlier return.     Reuben Likes, MD 07/15/12 2112

## 2012-10-09 ENCOUNTER — Emergency Department (HOSPITAL_COMMUNITY)
Admission: EM | Admit: 2012-10-09 | Discharge: 2012-10-09 | Disposition: A | Discharge status: Home / Self Care | Payer: Self-pay | Attending: Emergency Medicine | Admitting: Emergency Medicine

## 2012-10-09 ENCOUNTER — Encounter (HOSPITAL_COMMUNITY): Payer: Self-pay | Admitting: Emergency Medicine

## 2012-10-09 DIAGNOSIS — Z79899 Other long term (current) drug therapy: Secondary | ICD-10-CM | POA: Insufficient documentation

## 2012-10-09 DIAGNOSIS — F319 Bipolar disorder, unspecified: Secondary | ICD-10-CM | POA: Insufficient documentation

## 2012-10-09 DIAGNOSIS — IMO0002 Reserved for concepts with insufficient information to code with codable children: Secondary | ICD-10-CM | POA: Insufficient documentation

## 2012-10-09 DIAGNOSIS — M7989 Other specified soft tissue disorders: Secondary | ICD-10-CM | POA: Insufficient documentation

## 2012-10-09 DIAGNOSIS — L259 Unspecified contact dermatitis, unspecified cause: Secondary | ICD-10-CM | POA: Insufficient documentation

## 2012-10-09 DIAGNOSIS — L02419 Cutaneous abscess of limb, unspecified: Secondary | ICD-10-CM | POA: Insufficient documentation

## 2012-10-09 DIAGNOSIS — L03116 Cellulitis of left lower limb: Secondary | ICD-10-CM

## 2012-10-09 HISTORY — DX: Bipolar disorder, unspecified: F31.9

## 2012-10-09 HISTORY — DX: Depression, unspecified: F32.A

## 2012-10-09 HISTORY — DX: Major depressive disorder, single episode, unspecified: F32.9

## 2012-10-09 LAB — CBC WITH DIFFERENTIAL/PLATELET
Basophils Absolute: 0 10*3/uL (ref 0.0–0.1)
Basophils Relative: 0 % (ref 0–1)
Eosinophils Absolute: 0.7 10*3/uL (ref 0.0–0.7)
Eosinophils Relative: 7 % — ABNORMAL HIGH (ref 0–5)
HCT: 39.1 % (ref 36.0–46.0)
Hemoglobin: 13.2 g/dL (ref 12.0–15.0)
Lymphocytes Relative: 21 % (ref 12–46)
Lymphs Abs: 2.1 10*3/uL (ref 0.7–4.0)
MCH: 33.8 pg (ref 26.0–34.0)
MCHC: 33.8 g/dL (ref 30.0–36.0)
MCV: 100.3 fL — ABNORMAL HIGH (ref 78.0–100.0)
Monocytes Absolute: 1 10*3/uL (ref 0.1–1.0)
Monocytes Relative: 10 % (ref 3–12)
Neutro Abs: 6 10*3/uL (ref 1.7–7.7)
Neutrophils Relative %: 62 % (ref 43–77)
Platelets: 238 10*3/uL (ref 150–400)
RBC: 3.9 MIL/uL (ref 3.87–5.11)
RDW: 12.5 % (ref 11.5–15.5)
WBC: 9.7 10*3/uL (ref 4.0–10.5)

## 2012-10-09 LAB — POCT I-STAT, CHEM 8
BUN: 7 mg/dL (ref 6–23)
Calcium, Ion: 1.17 mmol/L (ref 1.12–1.23)
Chloride: 104 mEq/L (ref 96–112)
Creatinine, Ser: 1 mg/dL (ref 0.50–1.10)
Glucose, Bld: 77 mg/dL (ref 70–99)
HCT: 40 % (ref 36.0–46.0)
Hemoglobin: 13.6 g/dL (ref 12.0–15.0)
Potassium: 3.2 mEq/L — ABNORMAL LOW (ref 3.5–5.1)
Sodium: 141 mEq/L (ref 135–145)
TCO2: 30 mmol/L (ref 0–100)

## 2012-10-09 MED ORDER — PREDNISONE 20 MG PO TABS: 40.0000 mg | ORAL_TABLET | Freq: Every day | ORAL | Status: DC

## 2012-10-09 MED ORDER — HYDROXYZINE HCL 50 MG/ML IM SOLN
50.0000 mg | Freq: Four times a day (QID) | INTRAMUSCULAR | Status: DC | PRN
  Filled 2012-10-09: qty 1

## 2012-10-09 MED ORDER — DEXTROSE 5 % IV SOLN: 1.0000 g | Freq: Once | INTRAVENOUS | Status: DC

## 2012-10-09 MED ORDER — HYDROXYZINE HCL 25 MG PO TABS: 25.0000 mg | ORAL_TABLET | Freq: Four times a day (QID) | ORAL | Status: DC

## 2012-10-09 MED ORDER — CLINDAMYCIN HCL 150 MG PO CAPS: 300.0000 mg | ORAL_CAPSULE | Freq: Four times a day (QID) | ORAL | Status: DC

## 2012-10-09 MED ORDER — DIPHENHYDRAMINE HCL 50 MG/ML IJ SOLN
25.0000 mg | Freq: Once | INTRAMUSCULAR | Status: AC
  Administered 2012-10-09: 25 mg via INTRAVENOUS
  Filled 2012-10-09: qty 1

## 2012-10-09 MED ORDER — METHYLPREDNISOLONE SODIUM SUCC 125 MG IJ SOLR
125.0000 mg | Freq: Once | INTRAMUSCULAR | Status: AC
  Administered 2012-10-09: 125 mg via INTRAVENOUS
  Filled 2012-10-09: qty 2

## 2012-10-09 MED ORDER — CLINDAMYCIN HCL 150 MG PO CAPS: 300.0000 mg | ORAL_CAPSULE | Freq: Three times a day (TID) | ORAL | Status: DC

## 2012-10-09 MED ORDER — CLINDAMYCIN PHOSPHATE 600 MG/50ML IV SOLN
600.0000 mg | Freq: Once | INTRAVENOUS | Status: AC
  Administered 2012-10-09: 600 mg via INTRAVENOUS
  Filled 2012-10-09: qty 50

## 2012-10-09 NOTE — ED Provider Notes (Signed)
History     CSN: [id number]  Arrival date & time 10/09/12  1108   First MD Initiated Contact with Patient 10/09/12 1134      Chief Complaint  Patient presents with  . Cellulitis    (Consider location/radiation/quality/duration/timing/severity/associated sxs/prior treatment) HPI Alicia Logan is a 42 y.o. female who presents to ED with complaint of rash and left lower leg swelling and redness. States rash started about a week ago, on lower legs. States started after doing some stuff in the yard. States worsening on the left lower leg. Tried soaking it in salt water, tried hydrogen peroxide, one time neosporin treatment. States nothing is helping. Rash is very itchy and pt has been scratching it with finger nails, pomes stone, and skewers. States now having some abrasions over the area. Denies fever, chills, malaise. Did not take anything for itching. No one in family including husband has same rash.    Past Medical History  Diagnosis Date  . Depression   . Bipolar 1 disorder     History reviewed. No pertinent past surgical history.  No family history on file.  History  Substance Use Topics  . Smoking status: Former Games developer  . Smokeless tobacco: Not on file     Comment: 1/2 pack per day  . Alcohol Use: Yes    OB History   Grav Para Term Preterm Abortions TAB SAB Ect Mult Living                  Review of Systems  Constitutional: Negative for fever and chills.  Respiratory: Negative.   Cardiovascular: Negative.   Skin: Positive for color change and rash.  All other systems reviewed and are negative.    Allergies  Diclofenac and Sulfa antibiotics  Home Medications   Current Outpatient Rx  Name  Route  Sig  Dispense  Refill  . ALPRAZolam (XANAX) 1 MG tablet   Oral   Take 1 mg by mouth 4 (four) times daily as needed. For anxiety.         Marland Kitchen amphetamine-dextroamphetamine (ADDERALL) 10 MG tablet   Oral   Take 10 mg by mouth 2 (two) times daily.          .  cetirizine (ZYRTEC) 10 MG tablet   Oral   Take 10 mg by mouth daily.         . DULoxetine (CYMBALTA) 30 MG capsule   Oral   Take 90 mg by mouth daily.         . Oxycodone HCl 10 MG TABS   Oral   Take 10 mg by mouth every 6 (six) hours as needed (pain).         . triamcinolone cream (KENALOG) 0.1 %   Topical   Apply topically 3 (three) times daily.   30 g   0     BP 147/86  Pulse 93  Temp(Src) 97.9 F (36.6 C) (Oral)  Resp 20  SpO2 98%  LMP 10/07/2012  Physical Exam  Nursing note and vitals reviewed. Constitutional: She is oriented to person, place, and time. She appears well-developed and well-nourished. No distress.  Cardiovascular: Normal rate, regular rhythm and normal heart sounds.   Pulmonary/Chest: Effort normal and breath sounds normal. No respiratory distress. She has no wheezes. She has no rales.  Musculoskeletal:  Swelling noted to the left lower leg from ankle joint up to the knee. There is erythematous papular rash with scabs over bilateral lower legs, anterior thighs and forearms. No  rash in the webs of toes or fingers or on hands. There are multiple excoriations over left anterior lower shin with surrounding induration and erythema. Tender and warm to palpation.  Neurological: She is alert and oriented to person, place, and time.  Skin: Skin is warm and dry.  Psychiatric: She has a normal mood and affect. Her behavior is normal. Thought content normal.    ED Course  Procedures (including critical care time)  Pt with contact dermatitis rash, most likely from her yard, with now what appears to be cellulitis and irritation of left lower anterior shin. Will get cbc, bmet, if clindamycin, solumedrol, benadryl.  Results for orders placed during the hospital encounter of 10/09/12  CBC WITH DIFFERENTIAL      Result Value Range   WBC 9.7  4.0 - 10.5 K/uL   RBC 3.90  3.87 - 5.11 MIL/uL   Hemoglobin 13.2  12.0 - 15.0 g/dL   HCT 16.1  09.6 - 04.5 %   MCV  100.3 (*) 78.0 - 100.0 fL   MCH 33.8  26.0 - 34.0 pg   MCHC 33.8  30.0 - 36.0 g/dL   RDW 40.9  81.1 - 91.4 %   Platelets 238  150 - 400 K/uL   Neutrophils Relative 62  43 - 77 %   Neutro Abs 6.0  1.7 - 7.7 K/uL   Lymphocytes Relative 21  12 - 46 %   Lymphs Abs 2.1  0.7 - 4.0 K/uL   Monocytes Relative 10  3 - 12 %   Monocytes Absolute 1.0  0.1 - 1.0 K/uL   Eosinophils Relative 7 (*) 0 - 5 %   Eosinophils Absolute 0.7  0.0 - 0.7 K/uL   Basophils Relative 0  0 - 1 %   Basophils Absolute 0.0  0.0 - 0.1 K/uL  POCT I-STAT, CHEM 8      Result Value Range   Sodium 141  135 - 145 mEq/L   Potassium 3.2 (*) 3.5 - 5.1 mEq/L   Chloride 104  96 - 112 mEq/L   BUN 7  6 - 23 mg/dL   Creatinine, Ser 7.82  0.50 - 1.10 mg/dL   Glucose, Bld 77  70 - 99 mg/dL   Calcium, Ion 9.56  2.13 - 1.23 mmol/L   TCO2 30  0 - 100 mmol/L   Hemoglobin 13.6  12.0 - 15.0 g/dL   HCT 08.6  57.8 - 46.9 %   No results found.    1. Contact dermatitis   2. Cellulitis of left lower leg       MDM  Pt with contact dermatitis. No one if family with same rash, doubt scabies. Left lower leg cellulitis. Treated with clindamycin 600mg  iv in ed. Solumedrol 125mg  IV and benadryl 50mg  IV given for severe itching. Pt is now solmnolent but symptoms improved. She is here with her significant other who will take her home. She is afebrile. Non toxic. No elevated wbc. Will treat as outpatient with steroids, beandryl/vistrail., clindamycin. Follow up if not improving.   Filed Vitals:   10/09/12 1110  BP: 147/86  Pulse: 93  Temp: 97.9 F (36.6 C)  Resp: 20           Tatyana A Kirichenko, PA-C 10/09/12 1414

## 2012-10-09 NOTE — ED Notes (Addendum)
Pt complains of "scratches with red rash that is spreading" Pt states it has gotten worse over the last week.  Addendum:  Two weeks ago, pt stepped into a pile of brush.  After two days, the areas (bilateral legs) started weeping.  Pt used hydrogen peroxide, neosporin and salt water which was causing area to scab.  Pt began scratching and the sores reopened.  Pt used a "pediegg" to scratch areas on top of feet.  Areas are now itching and painful (throbbing).  Pt states that she is having some "mania" currently, but is taking bipolar medication properly.  She states that she is nervous about her leg condition and has recently come out of a state of depression for about 7 months.  Pt got a new job on Friday and feels now a sense of "mania."

## 2012-10-09 NOTE — ED Provider Notes (Signed)
   Medical screening examination/treatment/procedure(s) were performed by non-physician practitioner and as supervising physician I was immediately available for consultation/collaboration.   Benny Lennert, MD 10/09/12 1524

## 2012-10-09 NOTE — ED Notes (Signed)
PA at bedside.

## 2012-10-09 NOTE — ED Notes (Signed)
[id number]> Expected date:<BR> Expected time:<BR> Means of arrival:<BR> Comments:<BR> Elderly UTI

## 2012-10-10 NOTE — ED Notes (Signed)
Patient called requesting creme for cellulitis.

## 2012-10-21 ENCOUNTER — Encounter (HOSPITAL_COMMUNITY): Payer: Self-pay

## 2012-10-21 ENCOUNTER — Emergency Department (HOSPITAL_COMMUNITY)
Admission: EM | Admit: 2012-10-21 | Discharge: 2012-10-21 | Disposition: A | Discharge status: Home / Self Care | Payer: Self-pay | Attending: Emergency Medicine | Admitting: Emergency Medicine

## 2012-10-21 DIAGNOSIS — M549 Dorsalgia, unspecified: Secondary | ICD-10-CM | POA: Insufficient documentation

## 2012-10-21 DIAGNOSIS — S71109A Unspecified open wound, unspecified thigh, initial encounter: Secondary | ICD-10-CM | POA: Insufficient documentation

## 2012-10-21 DIAGNOSIS — S71009A Unspecified open wound, unspecified hip, initial encounter: Secondary | ICD-10-CM | POA: Insufficient documentation

## 2012-10-21 DIAGNOSIS — Z23 Encounter for immunization: Secondary | ICD-10-CM | POA: Insufficient documentation

## 2012-10-21 DIAGNOSIS — G8929 Other chronic pain: Secondary | ICD-10-CM | POA: Insufficient documentation

## 2012-10-21 DIAGNOSIS — S70919A Unspecified superficial injury of unspecified hip, initial encounter: Secondary | ICD-10-CM | POA: Insufficient documentation

## 2012-10-21 DIAGNOSIS — S70929A Unspecified superficial injury of unspecified thigh, initial encounter: Secondary | ICD-10-CM | POA: Insufficient documentation

## 2012-10-21 DIAGNOSIS — S40919A Unspecified superficial injury of unspecified shoulder, initial encounter: Secondary | ICD-10-CM | POA: Insufficient documentation

## 2012-10-21 DIAGNOSIS — F17200 Nicotine dependence, unspecified, uncomplicated: Secondary | ICD-10-CM | POA: Insufficient documentation

## 2012-10-21 DIAGNOSIS — Z79899 Other long term (current) drug therapy: Secondary | ICD-10-CM | POA: Insufficient documentation

## 2012-10-21 DIAGNOSIS — S80929A Unspecified superficial injury of unspecified lower leg, initial encounter: Secondary | ICD-10-CM | POA: Insufficient documentation

## 2012-10-21 DIAGNOSIS — S71112A Laceration without foreign body, left thigh, initial encounter: Secondary | ICD-10-CM

## 2012-10-21 DIAGNOSIS — F489 Nonpsychotic mental disorder, unspecified: Secondary | ICD-10-CM | POA: Insufficient documentation

## 2012-10-21 DIAGNOSIS — X789XXA Intentional self-harm by unspecified sharp object, initial encounter: Secondary | ICD-10-CM | POA: Insufficient documentation

## 2012-10-21 HISTORY — DX: Other chronic pain: G89.29

## 2012-10-21 HISTORY — DX: Dorsalgia, unspecified: M54.9

## 2012-10-21 MED ORDER — TETANUS-DIPHTH-ACELL PERTUSSIS 5-2.5-18.5 LF-MCG/0.5 IM SUSP
0.5000 mL | Freq: Once | INTRAMUSCULAR | Status: AC
  Administered 2012-10-21: 0.5 mL via INTRAMUSCULAR
  Filled 2012-10-21: qty 0.5

## 2012-10-21 MED ORDER — OXYCODONE-ACETAMINOPHEN 5-325 MG PO TABS
2.0000 | ORAL_TABLET | Freq: Once | ORAL | Status: AC
  Administered 2012-10-21: 2 via ORAL
  Filled 2012-10-21: qty 2

## 2012-10-21 NOTE — ED Notes (Signed)
[id number]> Expected date:<BR> Expected time:<BR> Means of arrival:<BR> Comments:<BR> Self inflicted lacs, possible stitches to thigh

## 2012-10-21 NOTE — ED Notes (Signed)
Per EMS- patient had self-inflicted lacerations to left upper arm, right lower leg and left inner thigh. Patient called EMS because the left inner thigh laceration was deepeer than what she meant to cut herself. Patient used a roken piece of porcelain to inflect lacerations.

## 2012-10-22 NOTE — ED Provider Notes (Signed)
History     CSN: [id number]  Arrival date & time 10/21/12  1217   First MD Initiated Contact with Patient 10/21/12 1321      Chief Complaint  Patient presents with  . self inflicted lacerations     (Consider location/radiation/quality/duration/timing/severity/associated sxs/prior treatment) HPI Comments: 42 y.o. Female presents with self inflicted wounds to left inner thigh and superficial excoriations to left upper arm and right lower leg. Bleeding controlled. Pt states that she had no suicidal/homocidal ideations, no visual/auditory hallucinations. Pt states that she was angry, broke her grandmother's porcelain dish, and cut herself. Pt has a hx of cutting and has been in counseling with Dr. Lafayette Dragon who is helping her with her behavior. Pt states she does not want behavioral health assistance at this time, that she will work it out with Dr. Lafayette Dragon. Feels she has been doing well and recognizes that tonight was a set-back. Significant other at bedside offering support.   Pt not sure when last tetanus was.  Pt admits moderate pain. Denies headache, nausea, vomiting, shortness of breath, numbness. PMHx bipolar, depression, chronic back pain   Past Medical History  Diagnosis Date  . Depression   . Bipolar 1 disorder   . Chronic back pain     Past Surgical History  Procedure Laterality Date  . Nasal septum surgery    . Dental surgery      Family History  Problem Relation Age of Onset  . Depression Mother   . Hypertension Father   . Heart failure Father   . Stroke Father     History  Substance Use Topics  . Smoking status: Current Every Day Smoker -- 0.50 packs/day    Types: Cigarettes  . Smokeless tobacco: Never Used     Comment: 1/2 pack per day  . Alcohol Use: 3.0 oz/week    5 Cans of beer per week     Comment: 4-5 beers daily    OB History   Grav Para Term Preterm Abortions TAB SAB Ect Mult Living                  Review of Systems  Constitutional: Negative for  fever and diaphoresis.  HENT: Negative for neck pain and neck stiffness.   Eyes: Negative for visual disturbance.  Respiratory: Negative for apnea, chest tightness and shortness of breath.   Cardiovascular: Negative for chest pain and palpitations.  Gastrointestinal: Negative for nausea, vomiting, diarrhea and constipation.  Musculoskeletal: Positive for back pain. Negative for gait problem.       Pt states chronic back pain  Skin: Positive for wound.       Minor excoriations on left upper arm, left lower leg, deeper laceration to left inner thigh, bleeding controlled  Neurological: Negative for dizziness, weakness, light-headedness, numbness and headaches.  Psychiatric/Behavioral: Positive for self-injury. Negative for suicidal ideas, hallucinations, behavioral problems, confusion and dysphoric mood. The patient is not nervous/anxious.     Allergies  Diclofenac and Sulfa antibiotics  Home Medications   Current Outpatient Rx  Name  Route  Sig  Dispense  Refill  . ALPRAZolam (XANAX) 1 MG tablet   Oral   Take 1 mg by mouth 4 (four) times daily as needed. For anxiety.         Marland Kitchen amphetamine-dextroamphetamine (ADDERALL) 10 MG tablet   Oral   Take 10 mg by mouth 2 (two) times daily.          . cetirizine (ZYRTEC) 10 MG tablet   Oral  Take 10 mg by mouth daily.         . DULoxetine (CYMBALTA) 30 MG capsule   Oral   Take 90 mg by mouth daily.         . hydrOXYzine (ATARAX/VISTARIL) 25 MG tablet   Oral   Take 1 tablet (25 mg total) by mouth every 6 (six) hours.   12 tablet   0   . Oxycodone HCl 10 MG TABS   Oral   Take 10 mg by mouth every 6 (six) hours as needed (pain).         . clindamycin (CLEOCIN) 150 MG capsule   Oral   Take 2 capsules (300 mg total) by mouth 3 (three) times daily.   42 capsule   0   . predniSONE (DELTASONE) 20 MG tablet   Oral   Take 2 tablets (40 mg total) by mouth daily.   10 tablet   0     BP 114/60  Pulse 80  Temp(Src) 98 F  (36.7 C) (Oral)  Resp 16  SpO2 96%  LMP 09/09/2012  Physical Exam  Nursing note and vitals reviewed. Constitutional: She is oriented to person, place, and time. She appears well-developed and well-nourished. No distress.  HENT:  Head: Normocephalic and atraumatic.  Eyes: Conjunctivae and EOM are normal.  Neck: Normal range of motion. Neck supple.  No meningeal signs  Cardiovascular: Normal rate, regular rhythm and normal heart sounds.   Pulmonary/Chest: Effort normal and breath sounds normal.  Abdominal: Soft. There is no tenderness.  Musculoskeletal: Normal range of motion. She exhibits no edema and no tenderness.  Neurological: She is alert and oriented to person, place, and time. No cranial nerve deficit.  Skin: Skin is warm and dry. She is not diaphoretic. No erythema.  Minor excoriations on left upper arm to the left lower leg deep to epidermis. Deeper 6cm linear laceration to left inner thigh, deep to dermis, bleeding controlled, bottom of wound visualized, no foreign bodies appreciated. Edges approximate well.   Psychiatric: She has a normal mood and affect.  Pt recognizes poor judgement in her actions tonight and will reach out to her psychiatrist to discuss.    ED Course  Procedures (including critical care time) LACERATION REPAIR Performed by: Glade Nurse Authorized by: Glade Nurse Consent: Verbal consent obtained. Risks and benefits: risks, benefits and alternatives were discussed Consent given by: patient Patient identity confirmed: provided demographic data Prepped and Draped in normal sterile fashion Wound explored  Laceration Location: left inner thigh  Laceration Length: 6cm  No Foreign Bodies seen or palpated  Anesthesia: local infiltration  Local anesthetic: lidocaine 2% with epinephrine  Anesthetic total:  6 ml  Irrigation method: syringe Amount of cleaning: standard  Skin closure: 3-0 prolene  Number of sutures: 6  Technique: simple  interrupted  Patient tolerance: Patient tolerated the procedure well with no immediate complications.  Labs Reviewed - No data to display No results found. Medications  oxyCODONE-acetaminophen (PERCOCET/ROXICET) 5-325 MG per tablet 2 tablet (2 tablets Oral Given 10/21/12 1430)  TDaP (BOOSTRIX) injection 0.5 mL (0.5 mLs Intramuscular Given 10/21/12 1518)     1. Laceration of left thigh without foreign body, initial encounter       MDM  Tdap booster given. Wound cleaning complete with pressure irrigation, bottom of wound visualized, no foreign bodies appreciated. Laceration occurred < 8 hours prior to repair which was well tolerated. Pt has no co morbidities to effect normal wound healing. Discussed suture home care w  pt and answered questions. Pt to f-u for wound check and suture removal in 7 days. Pt is hemodynamically stable w no complaints prior to dc.       Glade Nurse, PA-C 10/22/12 1250

## 2012-10-23 NOTE — ED Provider Notes (Signed)
Medical screening examination/treatment/procedure(s) were performed by non-physician practitioner and as supervising physician I was immediately available for consultation/collaboration.  Nathan R. Pickering, MD 10/23/12 1514 

## 2012-10-29 ENCOUNTER — Emergency Department (HOSPITAL_COMMUNITY)
Admission: EM | Admit: 2012-10-29 | Discharge: 2012-10-29 | Disposition: A | Discharge status: Home / Self Care | Payer: Self-pay | Attending: Emergency Medicine | Admitting: Emergency Medicine

## 2012-10-29 ENCOUNTER — Emergency Department (HOSPITAL_COMMUNITY): Payer: Self-pay

## 2012-10-29 DIAGNOSIS — L259 Unspecified contact dermatitis, unspecified cause: Secondary | ICD-10-CM | POA: Insufficient documentation

## 2012-10-29 DIAGNOSIS — Y838 Other surgical procedures as the cause of abnormal reaction of the patient, or of later complication, without mention of misadventure at the time of the procedure: Secondary | ICD-10-CM | POA: Insufficient documentation

## 2012-10-29 DIAGNOSIS — Z4802 Encounter for removal of sutures: Secondary | ICD-10-CM | POA: Insufficient documentation

## 2012-10-29 DIAGNOSIS — R21 Rash and other nonspecific skin eruption: Secondary | ICD-10-CM | POA: Insufficient documentation

## 2012-10-29 DIAGNOSIS — Z79899 Other long term (current) drug therapy: Secondary | ICD-10-CM | POA: Insufficient documentation

## 2012-10-29 DIAGNOSIS — L089 Local infection of the skin and subcutaneous tissue, unspecified: Secondary | ICD-10-CM | POA: Insufficient documentation

## 2012-10-29 DIAGNOSIS — F17200 Nicotine dependence, unspecified, uncomplicated: Secondary | ICD-10-CM | POA: Insufficient documentation

## 2012-10-29 DIAGNOSIS — F319 Bipolar disorder, unspecified: Secondary | ICD-10-CM | POA: Insufficient documentation

## 2012-10-29 DIAGNOSIS — L299 Pruritus, unspecified: Secondary | ICD-10-CM | POA: Insufficient documentation

## 2012-10-29 DIAGNOSIS — Z8739 Personal history of other diseases of the musculoskeletal system and connective tissue: Secondary | ICD-10-CM | POA: Insufficient documentation

## 2012-10-29 DIAGNOSIS — R6883 Chills (without fever): Secondary | ICD-10-CM | POA: Insufficient documentation

## 2012-10-29 DIAGNOSIS — T798XXD Other early complications of trauma, subsequent encounter: Secondary | ICD-10-CM

## 2012-10-29 LAB — CBC WITH DIFFERENTIAL/PLATELET
Basophils Absolute: 0 10*3/uL (ref 0.0–0.1)
Basophils Relative: 0 % (ref 0–1)
Eosinophils Absolute: 0.7 10*3/uL (ref 0.0–0.7)
Eosinophils Relative: 6 % — ABNORMAL HIGH (ref 0–5)
HCT: 44 % (ref 36.0–46.0)
Hemoglobin: 14.9 g/dL (ref 12.0–15.0)
Lymphocytes Relative: 16 % (ref 12–46)
Lymphs Abs: 2.2 10*3/uL (ref 0.7–4.0)
MCH: 33.9 pg (ref 26.0–34.0)
MCHC: 33.9 g/dL (ref 30.0–36.0)
MCV: 100 fL (ref 78.0–100.0)
Monocytes Absolute: 0.9 10*3/uL (ref 0.1–1.0)
Monocytes Relative: 7 % (ref 3–12)
Neutro Abs: 9.3 10*3/uL — ABNORMAL HIGH (ref 1.7–7.7)
Neutrophils Relative %: 71 % (ref 43–77)
Platelets: 250 10*3/uL (ref 150–400)
RBC: 4.4 MIL/uL (ref 3.87–5.11)
RDW: 12.6 % (ref 11.5–15.5)
WBC: 13.2 10*3/uL — ABNORMAL HIGH (ref 4.0–10.5)

## 2012-10-29 MED ORDER — CEPHALEXIN 500 MG PO CAPS: 500.0000 mg | ORAL_CAPSULE | Freq: Four times a day (QID) | ORAL | Status: DC

## 2012-10-29 MED ORDER — OXYCODONE-ACETAMINOPHEN 5-325 MG PO TABS
1.0000 | ORAL_TABLET | Freq: Once | ORAL | Status: AC
  Administered 2012-10-29: 1 via ORAL
  Filled 2012-10-29: qty 1
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
## 2012-10-29 NOTE — ED Notes (Signed)
Pt had stiches put in 8 days ago and the stiches have popped open on her inner L thigh and the area around is reddened. Pt thinks that she is allergic to tape but area has also been oozing clear pus.

## 2012-10-30 NOTE — ED Provider Notes (Signed)
Medical screening examination/treatment/procedure(s) were performed by non-physician practitioner and as supervising physician I was immediately available for consultation/collaboration. Iva Knapp, MD, FACEP   Iva L Knapp, MD 10/30/12 2309 

## 2012-11-01 LAB — WOUND CULTURE

## 2012-11-02 ENCOUNTER — Telehealth (HOSPITAL_COMMUNITY): Payer: Self-pay | Admitting: Emergency Medicine

## 2012-11-02 NOTE — ED Notes (Signed)
Patient has +Wound culture. °

## 2012-11-02 NOTE — ED Notes (Signed)
+  Wound. Patient given Keflex. No sensitivities listed. Chart sent to EDP office for review. °

## 2012-11-03 ENCOUNTER — Telehealth (HOSPITAL_COMMUNITY): Payer: Self-pay | Admitting: Emergency Medicine
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
## 2012-12-02 ENCOUNTER — Encounter (HOSPITAL_COMMUNITY): Payer: Self-pay | Admitting: Emergency Medicine

## 2012-12-02 ENCOUNTER — Emergency Department (HOSPITAL_COMMUNITY)
Admission: EM | Admit: 2012-12-02 | Discharge: 2012-12-02 | Disposition: A | Discharge status: Home / Self Care | Payer: Self-pay | Attending: Emergency Medicine | Admitting: Emergency Medicine

## 2012-12-02 DIAGNOSIS — Y838 Other surgical procedures as the cause of abnormal reaction of the patient, or of later complication, without mention of misadventure at the time of the procedure: Secondary | ICD-10-CM | POA: Insufficient documentation

## 2012-12-02 DIAGNOSIS — F319 Bipolar disorder, unspecified: Secondary | ICD-10-CM | POA: Insufficient documentation

## 2012-12-02 DIAGNOSIS — G8929 Other chronic pain: Secondary | ICD-10-CM | POA: Insufficient documentation

## 2012-12-02 DIAGNOSIS — R110 Nausea: Secondary | ICD-10-CM | POA: Insufficient documentation

## 2012-12-02 DIAGNOSIS — Z79899 Other long term (current) drug therapy: Secondary | ICD-10-CM | POA: Insufficient documentation

## 2012-12-02 DIAGNOSIS — T798XXD Other early complications of trauma, subsequent encounter: Secondary | ICD-10-CM

## 2012-12-02 DIAGNOSIS — T8140XA Infection following a procedure, unspecified, initial encounter: Secondary | ICD-10-CM | POA: Insufficient documentation

## 2012-12-02 DIAGNOSIS — Z3202 Encounter for pregnancy test, result negative: Secondary | ICD-10-CM | POA: Insufficient documentation

## 2012-12-02 DIAGNOSIS — M79605 Pain in left leg: Secondary | ICD-10-CM

## 2012-12-02 DIAGNOSIS — E876 Hypokalemia: Secondary | ICD-10-CM | POA: Insufficient documentation

## 2012-12-02 DIAGNOSIS — R443 Hallucinations, unspecified: Secondary | ICD-10-CM | POA: Insufficient documentation

## 2012-12-02 DIAGNOSIS — M549 Dorsalgia, unspecified: Secondary | ICD-10-CM | POA: Insufficient documentation

## 2012-12-02 DIAGNOSIS — F17200 Nicotine dependence, unspecified, uncomplicated: Secondary | ICD-10-CM | POA: Insufficient documentation

## 2012-12-02 DIAGNOSIS — R197 Diarrhea, unspecified: Secondary | ICD-10-CM | POA: Insufficient documentation

## 2012-12-02 LAB — CBC WITH DIFFERENTIAL/PLATELET
Basophils Absolute: 0 10*3/uL (ref 0.0–0.1)
Basophils Relative: 0 % (ref 0–1)
Eosinophils Absolute: 0.8 10*3/uL — ABNORMAL HIGH (ref 0.0–0.7)
Eosinophils Relative: 7 % — ABNORMAL HIGH (ref 0–5)
HCT: 36.3 % (ref 36.0–46.0)
Hemoglobin: 12.8 g/dL (ref 12.0–15.0)
Lymphocytes Relative: 25 % (ref 12–46)
Lymphs Abs: 2.6 10*3/uL (ref 0.7–4.0)
MCH: 34.4 pg — ABNORMAL HIGH (ref 26.0–34.0)
MCHC: 35.3 g/dL (ref 30.0–36.0)
MCV: 97.6 fL (ref 78.0–100.0)
Monocytes Absolute: 1.1 10*3/uL — ABNORMAL HIGH (ref 0.1–1.0)
Monocytes Relative: 11 % (ref 3–12)
Neutro Abs: 5.9 10*3/uL (ref 1.7–7.7)
Neutrophils Relative %: 57 % (ref 43–77)
Platelets: 199 10*3/uL (ref 150–400)
RBC: 3.72 MIL/uL — ABNORMAL LOW (ref 3.87–5.11)
RDW: 12.7 % (ref 11.5–15.5)
WBC: 10.3 10*3/uL (ref 4.0–10.5)

## 2012-12-02 LAB — COMPREHENSIVE METABOLIC PANEL
ALT: 12 U/L (ref 0–35)
AST: 27 U/L (ref 0–37)
Albumin: 2.9 g/dL — ABNORMAL LOW (ref 3.5–5.2)
Alkaline Phosphatase: 98 U/L (ref 39–117)
BUN: 10 mg/dL (ref 6–23)
CO2: 27 mEq/L (ref 19–32)
Calcium: 8.6 mg/dL (ref 8.4–10.5)
Chloride: 101 mEq/L (ref 96–112)
Creatinine, Ser: 0.81 mg/dL (ref 0.50–1.10)
GFR calc Af Amer: >90 mL/min (ref >90)
GFR calc non Af Amer: 90 mL/min — ABNORMAL LOW (ref >90)
Glucose, Bld: 80 mg/dL (ref 70–99)
Potassium: 2.8 mEq/L — ABNORMAL LOW (ref 3.5–5.1)
Sodium: 138 mEq/L (ref 135–145)
Total Bilirubin: 0.5 mg/dL (ref 0.3–1.2)
Total Protein: 5.8 g/dL — ABNORMAL LOW (ref 6.0–8.3)

## 2012-12-02 LAB — URINALYSIS, ROUTINE W REFLEX MICROSCOPIC
Glucose, UA: NEGATIVE mg/dL
Hgb urine dipstick: NEGATIVE
Ketones, ur: 15 mg/dL — AB
Leukocytes, UA: NEGATIVE
Nitrite: NEGATIVE
Protein, ur: NEGATIVE mg/dL
Specific Gravity, Urine: 1.044 — ABNORMAL HIGH (ref 1.005–1.030)
Urobilinogen, UA: 1 mg/dL (ref 0.0–1.0)
pH: 5.5 (ref 5.0–8.0)

## 2012-12-02 LAB — RAPID URINE DRUG SCREEN, HOSP PERFORMED
Amphetamines: POSITIVE — AB
Barbiturates: NOT DETECTED
Benzodiazepines: POSITIVE — AB
Cocaine: POSITIVE — AB
Opiates: NOT DETECTED
Tetrahydrocannabinol: POSITIVE — AB

## 2012-12-02 LAB — POCT PREGNANCY, URINE: Preg Test, Ur: NEGATIVE

## 2012-12-02 LAB — ETHANOL: Alcohol, Ethyl (B): <11 mg/dL (ref 0–11)

## 2012-12-02 LAB — ACETAMINOPHEN LEVEL: Acetaminophen (Tylenol), Serum: <15 ug/mL (ref 10–30)

## 2012-12-02 LAB — SALICYLATE LEVEL: Salicylate Lvl: <2 mg/dL — ABNORMAL LOW (ref 2.8–20.0)

## 2012-12-02 MED ORDER — POTASSIUM CHLORIDE 10 MEQ/100ML IV SOLN
10.0000 meq | Freq: Once | INTRAVENOUS | Status: AC
  Administered 2012-12-02: 10 meq via INTRAVENOUS
  Filled 2012-12-02 (×2): qty 100

## 2012-12-02 MED ORDER — POTASSIUM CHLORIDE 10 MEQ/100ML IV SOLN
10.0000 meq | Freq: Once | INTRAVENOUS | Status: AC
  Administered 2012-12-02: 10 meq via INTRAVENOUS

## 2012-12-02 MED ORDER — CLINDAMYCIN PHOSPHATE 600 MG/50ML IV SOLN
600.0000 mg | Freq: Once | INTRAVENOUS | Status: AC
  Administered 2012-12-02: 600 mg via INTRAVENOUS
  Filled 2012-12-02: qty 50

## 2012-12-02 MED ORDER — POTASSIUM CHLORIDE CRYS ER 20 MEQ PO TBCR
40.0000 meq | EXTENDED_RELEASE_TABLET | Freq: Once | ORAL | Status: AC
  Administered 2012-12-02: 40 meq via ORAL
  Filled 2012-12-02: qty 2
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
## 2012-12-02 NOTE — ED Notes (Signed)
ACT Team at bedside, pt then ambulated to restroom to obtain specimen.

## 2012-12-02 NOTE — ED Notes (Signed)
Patient states she has several self inflicted wounds that have possibly turned into infected area and now secondary wounds to site that are red and draining.   Patient states "I have used everything over the counter to heal them, but everything just burns".

## 2012-12-02 NOTE — ED Notes (Signed)
Pt sts redness to wounds from self inflicted lacerations that now are red and swollen x several months; pt noted to have draining small open wounds generalized to bilateral legs

## 2012-12-03 ENCOUNTER — Encounter (HOSPITAL_BASED_OUTPATIENT_CLINIC_OR_DEPARTMENT_OTHER): Payer: Self-pay

## 2012-12-04 NOTE — ED Provider Notes (Signed)
Medical screening examination/treatment/procedure(s) were performed by non-physician practitioner and as supervising physician I was immediately available for consultation/collaboration.   Richardean Canal, MD 12/04/12 [phone]

## 2015-08-16 ENCOUNTER — Other Ambulatory Visit: Payer: Self-pay | Admitting: *Deleted

## 2015-08-16 ENCOUNTER — Ambulatory Visit
Admission: RE | Admit: 2015-08-16 | Discharge: 2015-08-16 | Disposition: A | Discharge status: Home / Self Care | Payer: Medicare Other | Source: Ambulatory Visit | Attending: *Deleted | Admitting: *Deleted

## 2015-08-16 DIAGNOSIS — M5459 Other low back pain: Secondary | ICD-10-CM

## 2015-08-16 DIAGNOSIS — M545 Low back pain: Secondary | ICD-10-CM

## 2015-08-16 DIAGNOSIS — M25562 Pain in left knee: Secondary | ICD-10-CM

## 2016-04-10 ENCOUNTER — Ambulatory Visit: Payer: Self-pay | Admitting: Physician Assistant

## 2016-04-25 ENCOUNTER — Encounter (HOSPITAL_COMMUNITY): Payer: Self-pay

## 2016-04-25 ENCOUNTER — Inpatient Hospital Stay (HOSPITAL_COMMUNITY): Admission: RE | Admit: 2016-04-25 | Payer: Medicare Other | Source: Ambulatory Visit

## 2016-05-15 ENCOUNTER — Encounter (HOSPITAL_COMMUNITY)
Admission: RE | Admit: 2016-05-15 | Discharge: 2016-05-15 | Disposition: A | Discharge status: Home / Self Care | Payer: Medicare Other | Source: Ambulatory Visit | Attending: Orthopedic Surgery | Admitting: Orthopedic Surgery

## 2016-05-15 ENCOUNTER — Encounter (HOSPITAL_COMMUNITY): Payer: Self-pay

## 2016-05-15 DIAGNOSIS — G894 Chronic pain syndrome: Principal | ICD-10-CM | POA: Insufficient documentation

## 2016-05-15 DIAGNOSIS — M545 Low back pain: Secondary | ICD-10-CM | POA: Insufficient documentation

## 2016-05-15 DIAGNOSIS — F419 Anxiety disorder, unspecified: Secondary | ICD-10-CM | POA: Insufficient documentation

## 2016-05-15 DIAGNOSIS — Z01818 Encounter for other preprocedural examination: Secondary | ICD-10-CM | POA: Diagnosis not present

## 2016-05-15 DIAGNOSIS — F17210 Nicotine dependence, cigarettes, uncomplicated: Secondary | ICD-10-CM | POA: Diagnosis not present

## 2016-05-15 DIAGNOSIS — Z01812 Encounter for preprocedural laboratory examination: Secondary | ICD-10-CM

## 2016-05-15 HISTORY — DX: Headache: R51

## 2016-05-15 HISTORY — DX: Headache, unspecified: R51.9

## 2016-05-15 HISTORY — DX: Anxiety disorder, unspecified: F41.9

## 2016-05-15 LAB — CBC
HCT: 37.1 % (ref 36.0–46.0)
Hemoglobin: 12.6 g/dL (ref 12.0–15.0)
MCH: 30.8 pg (ref 26.0–34.0)
MCHC: 34 g/dL (ref 30.0–36.0)
MCV: 90.7 fL (ref 78.0–100.0)
Platelets: 220 10*3/uL (ref 150–400)
RBC: 4.09 MIL/uL (ref 3.87–5.11)
RDW: 12.7 % (ref 11.5–15.5)
WBC: 8.6 10*3/uL (ref 4.0–10.5)

## 2016-05-15 LAB — SURGICAL PCR SCREEN
MRSA, PCR: NEGATIVE
Staphylococcus aureus: POSITIVE — AB

## 2016-05-15 LAB — HCG, SERUM, QUALITATIVE: Preg, Serum: NEGATIVE

## 2016-05-15 NOTE — Pre-Procedure Instructions (Signed)
    Alicia SportsmanKathryn Logan  05/15/2016      [id number] [place] [address] [place] [address] Phone: [phone] Fax: [phone]  [id number] [place] - [address] [place] [address] Phone: [phone] Fax: [phone]  [id number] [place] - [address] [place] [address] Phone: [phone] Fax: [phone]  [facility] - [place] - [address] [place] [address] Phone: [phone] Fax: [phone]    Your procedure is scheduled on 05/18/16.  [facility].  Call this number if you have problems the morning of surgery:  [phone]   Remember:  Do not eat food or drink liquids after midnight.  Take these medicines the morning of surgery with A SIP OF WATER ---xanax,adderall,wellbutrin,nerurontin,oxycodone   Do not wear jewelry, make-up or nail polish.  Do not wear lotions, powders, or perfumes, or deoderant.  Do not shave 48 hours prior to surgery.  Men may shave face and neck.  Do not bring valuables to the hospital.  [facility] is not responsible for any belongings or valuables.  Contacts, dentures or bridgework may not be worn into surgery.  Leave your suitcase in the car.  After surgery it may be brought to your room.  For patients admitted to the hospital, discharge time will be determined by your treatment team.  Patients discharged the day of surgery will not be allowed to drive home.   Name and phone number of your driver:    Special instructions:  Do not take any aspirin,anti-inflammatories,vitamins,or herbal supplements 5-7 days prior to surgery.  Please read over the following fact sheets that you were given. MRSA Information

## 2016-05-18 ENCOUNTER — Encounter (HOSPITAL_COMMUNITY): Payer: Self-pay | Admitting: Certified Registered Nurse Anesthetist

## 2016-05-18 ENCOUNTER — Encounter (HOSPITAL_COMMUNITY)
Admission: RE | Disposition: A | Discharge status: Home / Self Care | Payer: Self-pay | Source: Ambulatory Visit | Attending: Orthopedic Surgery

## 2016-05-18 ENCOUNTER — Ambulatory Visit (HOSPITAL_COMMUNITY): Payer: Medicare Other | Admitting: Certified Registered Nurse Anesthetist

## 2016-05-18 ENCOUNTER — Observation Stay (HOSPITAL_COMMUNITY)
Admission: RE | Admit: 2016-05-18 | Discharge: 2016-05-19 | Disposition: A | Discharge status: Home / Self Care | Payer: Medicare Other | Source: Ambulatory Visit | Attending: Orthopedic Surgery | Admitting: Orthopedic Surgery

## 2016-05-18 ENCOUNTER — Ambulatory Visit (HOSPITAL_COMMUNITY): Payer: Medicare Other

## 2016-05-18 DIAGNOSIS — G894 Chronic pain syndrome: Secondary | ICD-10-CM | POA: Diagnosis not present

## 2016-05-18 DIAGNOSIS — Z01818 Encounter for other preprocedural examination: Secondary | ICD-10-CM | POA: Diagnosis not present

## 2016-05-18 DIAGNOSIS — F419 Anxiety disorder, unspecified: Secondary | ICD-10-CM | POA: Diagnosis not present

## 2016-05-18 DIAGNOSIS — Z419 Encounter for procedure for purposes other than remedying health state, unspecified: Secondary | ICD-10-CM

## 2016-05-18 DIAGNOSIS — M545 Low back pain: Secondary | ICD-10-CM | POA: Diagnosis not present

## 2016-05-18 DIAGNOSIS — G8929 Other chronic pain: Secondary | ICD-10-CM | POA: Diagnosis present

## 2016-05-18 HISTORY — PX: SPINAL CORD STIMULATOR INSERTION: SHX5378

## 2016-05-18 SURGERY — INSERTION, SPINAL CORD STIMULATOR, LUMBAR
Anesthesia: General

## 2016-05-18 MED ORDER — PROPOFOL 10 MG/ML IV BOLUS
INTRAVENOUS | Status: AC
  Filled 2016-05-18: qty 20

## 2016-05-18 MED ORDER — LIDOCAINE 2% (20 MG/ML) 5 ML SYRINGE
INTRAMUSCULAR | Status: AC
  Filled 2016-05-18: qty 5

## 2016-05-18 MED ORDER — CEFAZOLIN SODIUM-DEXTROSE 2-4 GM/100ML-% IV SOLN
INTRAVENOUS | Status: AC
  Filled 2016-05-18: qty 100

## 2016-05-18 MED ORDER — MENTHOL 3 MG MT LOZG: 1.0000 | LOZENGE | OROMUCOSAL | Status: DC | PRN

## 2016-05-18 MED ORDER — OXYCODONE HCL 5 MG/5ML PO SOLN: 5.0000 mg | Freq: Once | ORAL | Status: AC | PRN

## 2016-05-18 MED ORDER — AMPHETAMINE-DEXTROAMPHETAMINE 10 MG PO TABS
20.0000 mg | ORAL_TABLET | Freq: Every day | ORAL | Status: DC
  Filled 2016-05-18: qty 2

## 2016-05-18 MED ORDER — SODIUM CHLORIDE 0.9% FLUSH: 3.0000 mL | Freq: Two times a day (BID) | INTRAVENOUS | Status: DC

## 2016-05-18 MED ORDER — LACTATED RINGERS IV SOLN
INTRAVENOUS | Status: DC
  Administered 2016-05-18: 50 mL/h via INTRAVENOUS
  Administered 2016-05-18: 14:00:00 via INTRAVENOUS

## 2016-05-18 MED ORDER — ARTIFICIAL TEARS OP OINT
TOPICAL_OINTMENT | OPHTHALMIC | Status: DC | PRN
  Administered 2016-05-18: 1 via OPHTHALMIC

## 2016-05-18 MED ORDER — GLYCOPYRROLATE 0.2 MG/ML IJ SOLN
INTRAMUSCULAR | Status: DC | PRN
  Administered 2016-05-18: 0.2 mg via INTRAVENOUS

## 2016-05-18 MED ORDER — FENTANYL CITRATE (PF) 100 MCG/2ML IJ SOLN
INTRAMUSCULAR | Status: DC | PRN
  Administered 2016-05-18 (×2): 100 ug via INTRAVENOUS

## 2016-05-18 MED ORDER — FENTANYL CITRATE (PF) 100 MCG/2ML IJ SOLN
INTRAMUSCULAR | Status: AC
  Filled 2016-05-18: qty 2

## 2016-05-18 MED ORDER — ROCURONIUM BROMIDE 100 MG/10ML IV SOLN
INTRAVENOUS | Status: DC | PRN
  Administered 2016-05-18: 50 mg via INTRAVENOUS

## 2016-05-18 MED ORDER — ROCURONIUM BROMIDE 10 MG/ML (PF) SYRINGE
PREFILLED_SYRINGE | INTRAVENOUS | Status: AC
  Filled 2016-05-18: qty 10

## 2016-05-18 MED ORDER — DEXAMETHASONE SODIUM PHOSPHATE 10 MG/ML IJ SOLN
INTRAMUSCULAR | Status: AC
  Filled 2016-05-18: qty 1

## 2016-05-18 MED ORDER — ACETAMINOPHEN 10 MG/ML IV SOLN
INTRAVENOUS | Status: DC | PRN
  Administered 2016-05-18: 1000 mg via INTRAVENOUS

## 2016-05-18 MED ORDER — SODIUM CHLORIDE 0.9% FLUSH: 3.0000 mL | INTRAVENOUS | Status: DC | PRN

## 2016-05-18 MED ORDER — FENTANYL CITRATE (PF) 100 MCG/2ML IJ SOLN
25.0000 ug | INTRAMUSCULAR | Status: DC | PRN
  Administered 2016-05-18 (×2): 25 ug via INTRAVENOUS

## 2016-05-18 MED ORDER — THROMBIN 20000 UNITS EX SOLR
CUTANEOUS | Status: AC
  Filled 2016-05-18: qty 20000

## 2016-05-18 MED ORDER — PHENOL 1.4 % MT LIQD: 1.0000 | OROMUCOSAL | Status: DC | PRN

## 2016-05-18 MED ORDER — ALPRAZOLAM 0.5 MG PO TABS: 1.0000 mg | ORAL_TABLET | Freq: Four times a day (QID) | ORAL | Status: DC | PRN

## 2016-05-18 MED ORDER — PROPOFOL 10 MG/ML IV BOLUS
INTRAVENOUS | Status: DC | PRN
  Administered 2016-05-18: 150 mg via INTRAVENOUS
  Administered 2016-05-18: 50 mg via INTRAVENOUS

## 2016-05-18 MED ORDER — GABAPENTIN 600 MG PO TABS
600.0000 mg | ORAL_TABLET | Freq: Three times a day (TID) | ORAL | Status: DC
  Administered 2016-05-18 – 2016-05-19 (×2): 600 mg via ORAL
  Filled 2016-05-18 (×2): qty 1

## 2016-05-18 MED ORDER — ONDANSETRON HCL 4 MG/2ML IJ SOLN
INTRAMUSCULAR | Status: AC
  Filled 2016-05-18: qty 2

## 2016-05-18 MED ORDER — ACETAMINOPHEN 10 MG/ML IV SOLN
INTRAVENOUS | Status: AC
  Filled 2016-05-18: qty 100

## 2016-05-18 MED ORDER — ONDANSETRON HCL 4 MG/2ML IJ SOLN
INTRAMUSCULAR | Status: DC | PRN
  Administered 2016-05-18: 4 mg via INTRAVENOUS

## 2016-05-18 MED ORDER — DEXAMETHASONE SODIUM PHOSPHATE 10 MG/ML IJ SOLN
INTRAMUSCULAR | Status: DC | PRN
  Administered 2016-05-18: 10 mg via INTRAVENOUS

## 2016-05-18 MED ORDER — SUGAMMADEX SODIUM 200 MG/2ML IV SOLN
INTRAVENOUS | Status: AC
  Filled 2016-05-18: qty 2

## 2016-05-18 MED ORDER — CYCLOBENZAPRINE HCL 10 MG PO TABS
10.0000 mg | ORAL_TABLET | Freq: Two times a day (BID) | ORAL | Status: DC | PRN
  Administered 2016-05-19: 10 mg via ORAL
  Filled 2016-05-18: qty 1

## 2016-05-18 MED ORDER — ARTIFICIAL TEARS OP OINT
TOPICAL_OINTMENT | OPHTHALMIC | Status: AC
  Filled 2016-05-18: qty 3.5

## 2016-05-18 MED ORDER — MIDAZOLAM HCL 2 MG/2ML IJ SOLN
INTRAMUSCULAR | Status: AC
  Filled 2016-05-18: qty 2

## 2016-05-18 MED ORDER — HEMOSTATIC AGENTS (NO CHARGE) OPTIME
TOPICAL | Status: DC | PRN
  Administered 2016-05-18 (×2): 1 via TOPICAL

## 2016-05-18 MED ORDER — METHOCARBAMOL 500 MG PO TABS
500.0000 mg | ORAL_TABLET | Freq: Four times a day (QID) | ORAL | Status: DC | PRN
  Administered 2016-05-18 – 2016-05-19 (×3): 500 mg via ORAL
  Filled 2016-05-18 (×2): qty 1

## 2016-05-18 MED ORDER — ONDANSETRON HCL 4 MG PO TABS: 4.0000 mg | ORAL_TABLET | Freq: Three times a day (TID) | ORAL | 0 refills | Status: DC | PRN

## 2016-05-18 MED ORDER — CEFAZOLIN IN D5W 1 GM/50ML IV SOLN
1.0000 g | Freq: Three times a day (TID) | INTRAVENOUS | Status: AC
  Administered 2016-05-18 – 2016-05-19 (×2): 1 g via INTRAVENOUS
  Filled 2016-05-18 (×2): qty 50

## 2016-05-18 MED ORDER — GLYCOPYRROLATE 0.2 MG/ML IV SOSY
PREFILLED_SYRINGE | INTRAVENOUS | Status: AC
  Filled 2016-05-18: qty 3

## 2016-05-18 MED ORDER — DEXAMETHASONE 4 MG PO TABS
4.0000 mg | ORAL_TABLET | Freq: Four times a day (QID) | ORAL | Status: DC
  Administered 2016-05-18 – 2016-05-19 (×3): 4 mg via ORAL
  Filled 2016-05-18 (×3): qty 1

## 2016-05-18 MED ORDER — LACTATED RINGERS IV SOLN: INTRAVENOUS | Status: DC

## 2016-05-18 MED ORDER — CEFAZOLIN SODIUM-DEXTROSE 2-4 GM/100ML-% IV SOLN
2.0000 g | INTRAVENOUS | Status: AC
  Administered 2016-05-18: 2 g via INTRAVENOUS

## 2016-05-18 MED ORDER — PHENYLEPHRINE 40 MCG/ML (10ML) SYRINGE FOR IV PUSH (FOR BLOOD PRESSURE SUPPORT)
PREFILLED_SYRINGE | INTRAVENOUS | Status: AC
  Filled 2016-05-18: qty 10

## 2016-05-18 MED ORDER — LIDOCAINE HCL (CARDIAC) 20 MG/ML IV SOLN
INTRAVENOUS | Status: DC | PRN
  Administered 2016-05-18: 60 mg via INTRATRACHEAL

## 2016-05-18 MED ORDER — DEXAMETHASONE SODIUM PHOSPHATE 4 MG/ML IJ SOLN: 4.0000 mg | Freq: Four times a day (QID) | INTRAMUSCULAR | Status: DC

## 2016-05-18 MED ORDER — SODIUM CHLORIDE 0.9 % IV SOLN: 250.0000 mL | INTRAVENOUS | Status: DC

## 2016-05-18 MED ORDER — ONDANSETRON HCL 4 MG/2ML IJ SOLN: 4.0000 mg | Freq: Once | INTRAMUSCULAR | Status: DC | PRN

## 2016-05-18 MED ORDER — BUPROPION HCL ER (XL) 300 MG PO TB24
450.0000 mg | ORAL_TABLET | Freq: Every day | ORAL | Status: DC
  Administered 2016-05-18: 450 mg via ORAL
  Filled 2016-05-18 (×2): qty 1

## 2016-05-18 MED ORDER — LIDOCAINE-EPINEPHRINE (PF) 1 %-1:200000 IJ SOLN
INTRAMUSCULAR | Status: DC | PRN
  Administered 2016-05-18: 20 mL

## 2016-05-18 MED ORDER — AMPHETAMINE-DEXTROAMPHETAMINE 20 MG PO TABS: 20.0000 mg | ORAL_TABLET | Freq: Every day | ORAL | Status: DC

## 2016-05-18 MED ORDER — OXYCODONE HCL 5 MG PO TABS
5.0000 mg | ORAL_TABLET | Freq: Once | ORAL | Status: AC | PRN
  Administered 2016-05-18: 5 mg via ORAL

## 2016-05-18 MED ORDER — METHOCARBAMOL 500 MG PO TABS
ORAL_TABLET | ORAL | Status: AC
  Filled 2016-05-18: qty 1

## 2016-05-18 MED ORDER — MORPHINE SULFATE (PF) 2 MG/ML IV SOLN: 1.0000 mg | INTRAVENOUS | Status: DC | PRN

## 2016-05-18 MED ORDER — PRAZOSIN HCL 5 MG PO CAPS
5.0000 mg | ORAL_CAPSULE | Freq: Every day | ORAL | Status: DC
  Administered 2016-05-18: 5 mg via ORAL
  Filled 2016-05-18: qty 1

## 2016-05-18 MED ORDER — THROMBIN 20000 UNITS EX SOLR
CUTANEOUS | Status: DC | PRN
  Administered 2016-05-18: 20000 [IU] via TOPICAL

## 2016-05-18 MED ORDER — DEXTROSE 5 % IV SOLN: 500.0000 mg | Freq: Four times a day (QID) | INTRAVENOUS | Status: DC | PRN

## 2016-05-18 MED ORDER — OXYCODONE HCL 5 MG PO TABS
15.0000 mg | ORAL_TABLET | ORAL | Status: DC | PRN
  Administered 2016-05-18 – 2016-05-19 (×4): 15 mg via ORAL
  Filled 2016-05-18 (×4): qty 3

## 2016-05-18 MED ORDER — LIDOCAINE-EPINEPHRINE (PF) 1 %-1:200000 IJ SOLN
INTRAMUSCULAR | Status: AC
  Filled 2016-05-18: qty 30

## 2016-05-18 MED ORDER — EPHEDRINE 5 MG/ML INJ
INTRAVENOUS | Status: AC
  Filled 2016-05-18: qty 10

## 2016-05-18 MED ORDER — ONDANSETRON HCL 4 MG/2ML IJ SOLN: 4.0000 mg | INTRAMUSCULAR | Status: DC | PRN

## 2016-05-18 MED ORDER — 0.9 % SODIUM CHLORIDE (POUR BTL) OPTIME
TOPICAL | Status: DC | PRN
  Administered 2016-05-18: 1000 mL

## 2016-05-18 MED ORDER — SUGAMMADEX SODIUM 200 MG/2ML IV SOLN
INTRAVENOUS | Status: DC | PRN
  Administered 2016-05-18: 200 mg via INTRAVENOUS

## 2016-05-18 MED ORDER — OXYCODONE HCL 5 MG PO TABS
ORAL_TABLET | ORAL | Status: AC
  Filled 2016-05-18: qty 1

## 2016-05-18 SURGICAL SUPPLY — 61 items
CANISTER SUCTION 2500CC (MISCELLANEOUS) ×2 IMPLANT
CLSR STERI-STRIP ANTIMIC 1/2X4 (GAUZE/BANDAGES/DRESSINGS) ×2 IMPLANT
COVER PROBE W GEL 5X96 (DRAPES) IMPLANT
COVER SURGICAL LIGHT HANDLE (MISCELLANEOUS) ×2 IMPLANT
DECANTER SPIKE VIAL GLASS SM (MISCELLANEOUS) ×2 IMPLANT
DRAPE C-ARM 42X72 X-RAY (DRAPES) ×2 IMPLANT
DRAPE C-ARMOR (DRAPES) ×2 IMPLANT
DRAPE INCISE IOBAN 66X45 STRL (DRAPES) ×2 IMPLANT
DRAPE INCISE IOBAN 85X60 (DRAPES) IMPLANT
DRAPE SURG 17X23 STRL (DRAPES) ×2 IMPLANT
DRAPE U-SHAPE 47X51 STRL (DRAPES) ×2 IMPLANT
DRESSING AQUACEL AG SP 3.5X6 (GAUZE/BANDAGES/DRESSINGS) ×1 IMPLANT
DRSG AQUACEL AG ADV 3.5X 4 (GAUZE/BANDAGES/DRESSINGS) ×2 IMPLANT
DRSG AQUACEL AG ADV 3.5X10 (GAUZE/BANDAGES/DRESSINGS) ×2 IMPLANT
DRSG AQUACEL AG SP 3.5X6 (GAUZE/BANDAGES/DRESSINGS) ×2
DURAPREP 26ML APPLICATOR (WOUND CARE) ×2 IMPLANT
ELECT BLADE 4.0 EZ CLEAN MEGAD (MISCELLANEOUS) ×2
ELECT CAUTERY BLADE 6.4 (BLADE) ×2 IMPLANT
ELECT PENCIL ROCKER SW 15FT (MISCELLANEOUS) ×2 IMPLANT
ELECT REM PT RETURN 9FT ADLT (ELECTROSURGICAL) ×2
ELECTRODE BLDE 4.0 EZ CLN MEGD (MISCELLANEOUS) ×1 IMPLANT
ELECTRODE REM PT RTRN 9FT ADLT (ELECTROSURGICAL) ×1 IMPLANT
GLOVE BIOGEL PI IND STRL 8.5 (GLOVE) ×1 IMPLANT
GLOVE BIOGEL PI INDICATOR 8.5 (GLOVE) ×1
GLOVE SS N UNI LF 8.5 STRL (GLOVE) ×4 IMPLANT
GOWN STRL REUS W/TWL 2XL LVL3 (GOWN DISPOSABLE) ×2 IMPLANT
IPG PRECISION SPECTRA (Stimulator) ×2 IMPLANT
KIT BASIN OR (CUSTOM PROCEDURE TRAY) ×2 IMPLANT
KIT CHARGING (KITS) ×1
KIT CHARGING PRECISION NEURO (KITS) ×1 IMPLANT
KIT PAT PROGRAM FREELINK (KITS) ×1 IMPLANT
KIT ROOM TURNOVER OR (KITS) ×2 IMPLANT
LEAD COVER EDGE 50CM STIM KIT (Lead) ×2 IMPLANT
NEEDLE 22X1 1/2 (OR ONLY) (NEEDLE) ×2 IMPLANT
NEEDLE SPNL 18GX3.5 QUINCKE PK (NEEDLE) ×6 IMPLANT
NS IRRIG 1000ML POUR BTL (IV SOLUTION) ×2 IMPLANT
PACK LAMINECTOMY ORTHO (CUSTOM PROCEDURE TRAY) ×2 IMPLANT
PACK UNIVERSAL I (CUSTOM PROCEDURE TRAY) ×2 IMPLANT
PAD ARMBOARD 7.5X6 YLW CONV (MISCELLANEOUS) ×4 IMPLANT
PADDLE BLANK SIMULATOR LEAD 32 (MISCELLANEOUS) ×2 IMPLANT
REMOTE CONTROL KIT (KITS) ×2
SPATULA SILICONE BRAIN 10MM (MISCELLANEOUS) ×2 IMPLANT
SPONGE LAP 4X18 X RAY DECT (DISPOSABLE) IMPLANT
SPONGE SURGIFOAM ABS GEL 100 (HEMOSTASIS) ×2 IMPLANT
STAPLER VISISTAT 35W (STAPLE) ×2 IMPLANT
SURGIFLO W/THROMBIN 8M KIT (HEMOSTASIS) ×4 IMPLANT
SUT BONE WAX W31G (SUTURE) ×2 IMPLANT
SUT FIBERWIRE #2 38 T-5 BLUE (SUTURE) ×2
SUT VIC AB 1 CT1 18XCR BRD 8 (SUTURE) ×2 IMPLANT
SUT VIC AB 1 CT1 27 (SUTURE) ×1
SUT VIC AB 1 CT1 27XBRD ANBCTR (SUTURE) ×1 IMPLANT
SUT VIC AB 1 CT1 8-18 (SUTURE) ×2
SUT VIC AB 2-0 CT1 18 (SUTURE) ×2 IMPLANT
SUTURE FIBERWR #2 38 T-5 BLUE (SUTURE) ×1 IMPLANT
SYR BULB IRRIGATION 50ML (SYRINGE) ×2 IMPLANT
SYR CONTROL 10ML LL (SYRINGE) ×2 IMPLANT
TOWEL OR 17X24 6PK STRL BLUE (TOWEL DISPOSABLE) ×2 IMPLANT
TOWEL OR 17X26 10 PK STRL BLUE (TOWEL DISPOSABLE) ×2 IMPLANT
TRAY FOLEY CATH 16FRSI W/METER (SET/KITS/TRAYS/PACK) IMPLANT
WATER STERILE IRR 1000ML POUR (IV SOLUTION) IMPLANT
YANKAUER SUCT BULB TIP NO VENT (SUCTIONS) ×2 IMPLANT

## 2016-05-18 NOTE — Discharge Instructions (Signed)
Museum[employer]gallery [employer] representative will teach you how to use the SCS prior to discharge.   Please contact representative with any technical questions or concerns. Please contact Dr Marlynn PerkingMalloy (you pain management provider) for changes or alterations to pain medicine.

## 2016-05-18 NOTE — Anesthesia Procedure Notes (Signed)
Procedure Name: Intubation Date/Time: 05/18/2016 1:15 PM Performed by: Noreene LarssonJOSLIN, DAVID Pre-anesthesia Checklist: Patient identified, Emergency Drugs available, Suction available and Patient being monitored Patient Re-evaluated:Patient Re-evaluated prior to inductionOxygen Delivery Method: Circle system utilized Preoxygenation: Pre-oxygenation with 100% oxygen Intubation Type: IV induction Ventilation: Oral airway inserted - appropriate to patient size Laryngoscope Size: Mac and 3 Grade View: Grade I Tube type: Oral Tube size: 7.0 mm Number of attempts: 1 Airway Equipment and Method: Stylet Placement Confirmation: ETT inserted through vocal cords under direct vision,  positive ETCO2 and breath sounds checked- equal and bilateral Secured at: 21 cm Tube secured with: Tape Dental Injury: Teeth and Oropharynx as per pre-operative assessment

## 2016-05-18 NOTE — Brief Op Note (Signed)
05/18/2016  3:20 PM  PATIENT:  Alicia SportsmanKathryn Logan  45 y.o. female  PRE-OPERATIVE DIAGNOSIS:  CHRONIC PAIN SYNDROME  POST-OPERATIVE DIAGNOSIS:  CHRONIC PAIN SYNDROME  PROCEDURE:  Procedure(s): LUMBAR SPINAL CORD STIMULATOR INSERTION (N/A)  SURGEON:  Surgeon(s) and Role:    * Venita Lickahari Brooks, MD - Primary  PHYSICIAN ASSISTANT:   ASSISTANTS: none   ANESTHESIA:   general  EBL:  Total I/O In: 1000 [I.V.:1000] Out: 250 [Blood:250]  BLOOD ADMINISTERED:none  DRAINS: none   LOCAL MEDICATIONS USED:  LIDOCAINE   SPECIMEN:  No Specimen  DISPOSITION OF SPECIMEN:  N/A  COUNTS:  YES  TOURNIQUET:  * No tourniquets in log *  DICTATION: .Other Dictation: Dictation Number [id number]  PLAN OF CARE: Admit for overnight observation  PATIENT DISPOSITION:  PACU - hemodynamically stable.

## 2016-05-18 NOTE — H&P (Signed)
History of Present Illness  The patient is a 45 year old female who comes in today for a preoperative History and Physical. The patient is scheduled for a SCS implantation to be performed by Dr. Debria Garret D. Shon Baton, MD at [facility] on 05/17/2016 . Please see the hospital record for complete dictated history and physical. Thie is a pt of Dr. Marlynn Perking. She had a very successful trial SCS trial.   Problem List/Past Medical  Pre-op evaluation (Z01.818)  Lumbar pain (M54.5)  Problems Reconciled   Allergies  Sulfa Antibiotics  Itching, Rash. Allergies Reconciled   Family History Cancer  Maternal Grandmother, Paternal Grandfather. Cerebrovascular Accident  Father. Depression  Mother. Diabetes Mellitus  Father, Maternal Grandfather, Maternal Grandmother, Paternal Grandfather. First Degree Relatives  reported Heart Disease  Father. Heart disease in female family member before age 13  Osteoporosis  Maternal Grandmother. Severe allergy  Mother.  Social History Children  0 Current work status  disabled Exercise  Exercises weekly; does other Former drinker  04/04/2016: In the past drank beer only occasionally per week Living situation  live with parents Marital status  single No history of drug/alcohol rehab  Number of flights of stairs before winded  2-3 Tobacco / smoke exposure  04/04/2016: yes Tobacco use  Current some day smoker. 04/04/2016: smoke(d) 3 or more pack(s) per day uses less than 1/2 can(s) smokeless per week Under pain contract   Medication History OxyCODONE HCl (15MG  Tablet, Oral) Active. Cyclobenzaprine HCl (10MG  Tablet, Oral) Active. LamoTRIgine (200MG  Tablet, Oral) Active. ALPRAZolam (1MG  Tablet, Oral) Active. Phentermine HCl (37.5MG  Tablet, Oral) Active. Gabapentin (600MG  Tablet, Oral) Active. Prazosin HCl (5MG  Capsule, Oral) Active. BuPROPion HCl ER (XL) (150MG  Tablet ER 24HR, Oral) Active. Amphetamine-Dextroamphetamine  (20MG  Tablet, Oral) Active. Dextroamphetamine Sulfate (Oral) Specific strength unknown - Active. LaMICtal (200MG  Tablet, Oral) Active. Medications Reconciled  Pregnancy / Birth History Pregnant  no  Past Surgical History  Straighten Nasal Septum   Other Problems Anxiety Disorder  Chronic Pain  Depression  Psychiatric disorder   Vitals  05/12/2016 12:58 PM Weight: 179 lb Height: 68in Body Surface Area: 1.95 m Body Mass Index: 27.22 kg/m  Temp.: 98.88F(Oral)  Pulse: 98 (Regular)  BP: 116/76 (Sitting, Left Arm, Standard)  General General Appearance-Not in acute distress. Orientation-Oriented X3. Build & Nutrition-Well nourished and Well developed.  Integumentary General Characteristics Surgical Scars - no surgical scar evidence of previous lumbar surgery. Lumbar Spine-Skin examination of the lumbar spine is without deformity, skin lesions, lacerations or abrasions.  Chest and Lung Exam Auscultation Breath sounds - Normal and Clear.  Cardiovascular Auscultation Rhythm - Regular rate and rhythm.  Abdomen Palpation/Percussion Palpation and Percussion of the abdomen reveal - Soft, Non Tender and No Rebound tenderness.  Peripheral Vascular Lower Extremity Palpation - Posterior tibial pulse - Bilateral - 2+. Dorsalis pedis pulse - Bilateral - 2+.  Neurologic Sensation Lower Extremity - Bilateral - sensation is intact in the lower extremity. Reflexes Patellar Reflex - Bilateral - 2+. Achilles Reflex - Bilateral - 2+. Clonus - Bilateral - clonus not present. Hoffman's Sign - Bilateral - Hoffman's sign not present. Testing Seated Straight Leg Raise - Bilateral - Seated straight leg raise negative.  Musculoskeletal Spine/Ribs/Pelvis  Lumbosacral Spine: Inspection and Palpation - Tenderness - right lumbar paraspinals tender to palpation. Strength and Tone: Strength - Hip Flexion - Bilateral - 5/5. Knee Extension - Bilateral - 5/5. Knee  Flexion - Bilateral - 5/5. Ankle Dorsiflexion - Bilateral - 5/5. Ankle Plantarflexion - Bilateral - 5/5. Heel  walk - Bilateral - able to heel walk without difficulty. Toe Walk - Bilateral - able to walk on toes without difficulty. Heel-Toe Walk - Bilateral - able to heel-toe walk without difficulty. ROM - Flexion - moderately decreased range of motion and painful. Extension - moderately decreased range of motion and painful. Left Lateral Bending - moderately decreased range of motion and painful. Right Lateral Bending - moderately decreased range of motion and painful. Right Rotation - moderately decreased range of motion and painful. Left Rotation - moderately decreased range of motion and painful. Pain - . Lumbosacral Spine - Waddell's Signs - no Waddell's signs present. Lower Extremity Range of Motion - No true hip, knee or ankle pain with range of motion. Gait and Station - [employer]ive Devices - no assistive devices.  Thoracic MRI: no contra indication to placement (no significant canal stenosis)  Plans Transcription At this point in time, the patient has been in pain management with Dr. Marlynn PerkingMalloy and has had ongoing debilitating symptoms. She did get significant relief with a trial spinal cord stimulator implant. As a result, she was referred to me today for permanent implantation. We have gone over the surgical procedure which would be a thoracic T9 laminotomy for placement of a T8 lead. This would then track down to a second incision site where the battery would be placed. I have reviewed the risks with her, which include infection, bleeding, nerve damage, death, stroke, paralysis, failure to heal, need for further surgery, ongoing or worse pain, new problem such as thoracic incision site pain, pneumothorax, need for hardware complications, hardware failure. All of her and her father's questions were addressed. We will plan on getting a stat thoracic MRI to ensure that there is adequate space for the  implant. I did tell her the postoperatively she would be in the hospital one night. We will get her up walking that same day. Once the wound is completely healed at about four weeks, I will turn her care back over to Dr. Marlynn PerkingMalloy. In terms of her postoperative pain regimen, I will also defer this to Dr. Marlynn PerkingMalloy.

## 2016-05-18 NOTE — Transfer of Care (Signed)
Immediate Anesthesia Transfer of Care Note  Patient: Alicia Logan  Procedure(s) Performed: Procedure(s): LUMBAR SPINAL CORD STIMULATOR INSERTION (N/A)  Patient Location: PACU  Anesthesia Type:General  Level of Consciousness: awake, alert  and oriented  Airway & Oxygen Therapy: Patient Spontanous Breathing and Patient connected to nasal cannula oxygen  Post-op Assessment: Report given to RN and Post -op Vital signs reviewed and stable  Post vital signs: Reviewed and stable  Last Vitals:  Vitals:   05/18/16 1001 05/18/16 1530  BP: 130/80 (!) 156/84  Pulse: 93 78  Resp: 20 15  Temp: 36.5 C 36.4 C    Last Pain:  Vitals:   05/18/16 1021  TempSrc:   PainSc: 7       Patients Stated Pain Goal: 3 (05/18/16 1021)  Complications: No apparent anesthesia complications

## 2016-05-18 NOTE — Anesthesia Preprocedure Evaluation (Addendum)
Anesthesia Evaluation  Patient identified by MRN, date of birth, ID band Patient awake    Reviewed: Allergy & Precautions, NPO status , Patient's Chart, lab work & pertinent test results  Airway Mallampati: II  TM Distance: >3 FB Neck ROM: Full    Dental  (+) Teeth Intact, Dental Advisory Given   Pulmonary Current Smoker,  breath sounds clear to auscultation        Cardiovascular Rhythm:Regular Rate:Normal     Neuro/Psych    GI/Hepatic   Endo/Other    Renal/GU      Musculoskeletal   Abdominal   Peds  Hematology   Anesthesia Other Findings   Reproductive/Obstetrics                             Anesthesia Physical Anesthesia Plan  ASA: II  Anesthesia Plan: General   Post-op Pain Management:    Induction: Intravenous  Airway Management Planned: Oral ETT  Additional Equipment:   Intra-op Plan:   Post-operative Plan: Extubation in OR  Informed Consent: I have reviewed the patients History and Physical, chart, labs and discussed the procedure including the risks, benefits and alternatives for the proposed anesthesia with the patient or authorized representative who has indicated his/her understanding and acceptance.   Dental advisory given  Plan Discussed with: CRNA and Anesthesiologist  Anesthesia Plan Comments:         Anesthesia Quick Evaluation  

## 2016-05-18 NOTE — Anesthesia Postprocedure Evaluation (Signed)
Anesthesia Post Note  Patient: Ardeth SportsmanKathryn Kathan  Procedure(s) Performed: Procedure(s) (LRB): LUMBAR SPINAL CORD STIMULATOR INSERTION (N/A)  Anesthesia Post Evaluation  Last Vitals:  Vitals:   05/18/16 1734 05/18/16 2030  BP: (!) 138/91 136/90  Pulse: 65 75  Resp: 18 18  Temp: 36.3 C 36.7 C    Last Pain:  Vitals:   05/18/16 2030  TempSrc: Oral  PainSc:                  JOSLIN,DAVID COKER

## 2016-05-18 NOTE — Anesthesia Postprocedure Evaluation (Signed)
Anesthesia Post Note  Patient: Alicia SportsmanKathryn Logan  Procedure(s) Performed: Procedure(s) (LRB): LUMBAR SPINAL CORD STIMULATOR INSERTION (N/A)  Patient location during evaluation: PACU Anesthesia Type: General Level of consciousness: awake, awake and alert and oriented Pain management: pain level controlled Vital Signs Assessment: post-procedure vital signs reviewed and stable Respiratory status: spontaneous breathing, nonlabored ventilation and respiratory function stable Cardiovascular status: blood pressure returned to baseline Postop Assessment: no headache Anesthetic complications: no    Last Vitals:  Vitals:   05/18/16 1734 05/18/16 2030  BP: (!) 138/91 136/90  Pulse: 65 75  Resp: 18 18  Temp: 36.3 C 36.7 C    Last Pain:  Vitals:   05/18/16 2030  TempSrc: Oral  PainSc:                  JOSLIN,DAVID COKER

## 2016-05-19 ENCOUNTER — Encounter (HOSPITAL_COMMUNITY): Payer: Self-pay | Admitting: Orthopedic Surgery

## 2016-05-19 DIAGNOSIS — G894 Chronic pain syndrome: Secondary | ICD-10-CM | POA: Diagnosis not present

## 2016-05-19 NOTE — Evaluation (Signed)
Physical Therapy Evaluation Patient Details Name: Alicia Logan MRN: [id number] DOB: April 07, 1971 Today's Date: 05/19/2016   History of Present Illness  Pt is a 45 y.o. female s/p LUMBAR SPINAL CORD STIMULATOR INSERTION. PMHx: Anxiety, Chronic pain, Depression, Psychiatric disorder.   Clinical Impression  Patient presents with anxiety, pain and post surgical deficits s/p above. Tolerated gait training with Min A-min guard for safety. Imbalance noted during gait but most likely due to lethargy and affects of pain medications. Pt has support from father at home. Pt very emotional about family history and dog during session- fear of losing them. Anticipate mobility will improve with increased activity and when pain medication wears off. Will follow acutely to maximize independence and mobility prior to return home.    Follow Up Recommendations Supervision for mobility/OOB    Equipment Recommendations  None recommended by PT    Recommendations for Other Services       Precautions / Restrictions Precautions Precautions: Fall Precaution Comments: Educated on back precautions for comfort and proper body mechanics Restrictions Weight Bearing Restrictions: No      Mobility  Bed Mobility Overal bed mobility: Needs Assistance Bed Mobility: Rolling;Sidelying to Sit Rolling: Supervision Sidelying to sit: Min guard       General bed mobility comments: VCs for technique. HOB flat without use of bed rails.  Transfers Overall transfer level: Needs assistance Equipment used: None Transfers: Sit to/from Stand Sit to Stand: Supervision         General transfer comment: Supervision for safety; no physical assist required. Transferred to chair post ambulation bout.  Ambulation/Gait Ambulation/Gait assistance: Min assist Ambulation Distance (Feet): 150 Feet Assistive device: None Gait Pattern/deviations: Step-through pattern;Decreased stride length;Trunk flexed Gait velocity:  decreased Gait velocity interpretation: Below normal speed for age/gender General Gait Details: Cues for upright posture; mild sway and imbalance noted during session requiring Min A at times. Cues to increase cadence for safe speed and to help with balance.   Stairs            Wheelchair Mobility    Modified Rankin (Stroke Patients Only)       Balance Overall balance assessment: Needs assistance Sitting-balance support: Feet supported;No upper extremity supported Sitting balance-Leahy Scale: Good     Standing balance support: During functional activity Standing balance-Leahy Scale: Fair Standing balance comment: Reliant on Min A for balance at times otherwise Min guard due to distractability and affects of pain medications.                             Pertinent Vitals/Pain Pain Assessment: Faces Faces Pain Scale: Hurts even more Pain Location: back  Pain Descriptors / Indicators: Grimacing;Guarding;Aching Pain Intervention(s): Monitored during session;Repositioned;Limited activity within patient's tolerance    Home Living Family/patient expects to be discharged to:: Private residence Living Arrangements: Parent (father) Available Help at Discharge: Family Type of Home: House Home Access: Stairs to enter Entrance Stairs-Rails: None Secretary/administrator of Steps: 3 Home Layout: One level Home Equipment: Shower seat      Prior Function Level of Independence: Independent         Comments: Drives. Does yoga.     Hand Dominance        Extremity/Trunk Assessment   Upper Extremity Assessment: Defer to OT evaluation           Lower Extremity Assessment: Generalized weakness      Cervical / Trunk Assessment: Other exceptions  Communication   Communication: No  difficulties  Cognition Arousal/Alertness: Awake/alert Behavior During Therapy: Anxious (tearful about life- dad, dog) Overall Cognitive Status: Within Functional Limits for  tasks assessed                      General Comments General comments (skin integrity, edema, etc.): Pt very distracted with family and pain.    Exercises     Assessment/Plan    PT Assessment Patient needs continued PT services  PT Problem List Decreased strength;Decreased mobility;Decreased balance;Pain;Decreased cognition;Decreased activity tolerance          PT Treatment Interventions DME instruction;Therapeutic activities;Gait training;Therapeutic exercise;Patient/family education;Balance training;Functional mobility training;Stair training    PT Goals (Current goals can be found in the Care Plan section)  Acute Rehab PT Goals Patient Stated Goal: decrease pain PT Goal Formulation: With patient Time For Goal Achievement: 06/02/16 Potential to Achieve Goals: Good    Frequency Min 5X/week   Barriers to discharge        Co-evaluation               End of Session Equipment Utilized During Treatment: Gait belt Activity Tolerance: Patient limited by lethargy;Patient limited by pain Patient left: in chair;with call bell/phone within reach Nurse Communication: Mobility status    Functional Assessment Tool Used: clinical judgment Functional Limitation: Mobility: Walking and moving around Mobility: Walking and Moving Around Current Status ([id number]): At least 20 percent but less than 40 percent impaired, limited or restricted Mobility: Walking and Moving Around Goal Status [phone](G8979): At least 1 percent but less than 20 percent impaired, limited or restricted    Time: 0749 [phone] PT Time Calculation (min) (ACUTE ONLY): 21 min   Charges:   PT Evaluation $PT Eval Low Complexity: 1 Procedure PT Treatments $Gait Training: 8-22 mins   PT G Codes:   PT G-Codes **NOT FOR INPATIENT CLASS** Functional Assessment Tool Used: clinical judgment Functional Limitation: Mobility: Walking and moving around Mobility: Walking and Moving Around Current Status (U9811(G8978):  At least 20 percent but less than 40 percent impaired, limited or restricted Mobility: Walking and Moving Around Goal Status [phone](G8979): At least 1 percent but less than 20 percent impaired, limited or restricted    Shauna A Hartshorne 05/19/2016, 9:33 AM Mylo RedShauna Hartshorne, PT, DPT [phone]

## 2016-05-19 NOTE — Progress Notes (Signed)
     Subjective: Procedure(s) (LRB): LUMBAR SPINAL CORD STIMULATOR INSERTION (N/A) 1 Day Post-Op  Patient reports pain as 4 on 0-10 scale.  Reports decreased leg pain reports incisional back pain   Positive void Negative bowel movement Positive flatus Negative chest pain or shortness of breath  Objective: Vital signs in last 24 hours: Temp:  [97.4 F (36.3 C)-98.3 F (36.8 C)] 98.3 F (36.8 C) (10/27 0320) Pulse Rate:  [61-97] 93 (10/27 0320) Resp:  [11-20] 18 (10/27 0320) BP: (109-156)/(78-91) 114/78 (10/27 0320) SpO2:  [96 %-100 %] 96 % (10/27 0320) FiO2 (%):  [2 %] 2 % (10/26 1734) Weight:  [81.6 kg (180 lb)] 81.6 kg (180 lb) (10/26 0947)  Intake/Output from previous day: 10/26 0701 - 10/27 0700 In: 1970 [P.O.:290; I.V.:1680] Out: 250 [Blood:250]  Labs: No results for input(s): WBC, RBC, HCT, PLT in the last 72 hours. No results for input(s): NA, K, CL, CO2, BUN, CREATININE, GLUCOSE, CALCIUM in the last 72 hours. No results for input(s): LABPT, INR in the last 72 hours.  Physical Exam: Neurologically intact ABD soft Intact pulses distally Incision: dressing C/D/I Compartment soft  Assessment/Plan: Patient stable  xrays n/a Continue mobilization with physical therapy Continue care  Advance diet Up with therapy  Plan on d/c to home after SCS activated Doing well overall - continue to encourage ambulation  Venita Lickahari Brooks, MD [facility] [phone]

## 2016-05-19 NOTE — Progress Notes (Signed)
Pt discharge education and instructions completed by out going RN Morrie SheldonAshley. Pt IV removed; pt discharge home with family to transport her home. Pt transported off unit via wheelchair with family and belongings to the side. Dionne BucyP. Amo Kuffour RN

## 2016-05-19 NOTE — Op Note (Signed)
NAMERHEANA, CASEBOLT NO.:  [id number]  MEDICAL RECORD NO.:  [id number]  LOCATION:  3C02C                        FACILITY:  [facility]  PHYSICIAN:  Dahari D. Shon Baton, M.D. DATE OF BIRTH:  [birth year]  DATE OF PROCEDURE:  05/18/2016 DATE OF DISCHARGE:                              OPERATIVE REPORT   PREOPERATIVE DIAGNOSIS:  Chronic pain syndrome, status post successful spinal cord stimulator trial.  POSTOPERATIVE DIAGNOSIS:  Chronic pain syndrome, status post successful spinal cord stimulator trial.  OPERATIVE PROCEDURE:  Implantation of spinal cord stimulator.  IMPLANT USED:  [employer] system with the IPG battery with the coverage paddle.  No intraoperative complications.  CONDITION:  Stable.  HISTORY:  This is a very pleasant, 45 year old who has had a chronic pain syndrome.  She is under the care of Dr. Alwyn Pea.  She had a trial of spinal cord stimulator placed that was successful in managing her chronic pain syndrome.  As a result, she was referred to me for permanent implantation.  After discussing risks, benefits, and alternatives, we elected to proceed with surgery.  DESCRIPTION OF PROCEDURE:  The patient was brought to the operating room and placed supine on the operating table.  After successful induction of general anesthesia and endotracheal intubation, TEDs, SCDs were applied. She was turned prone onto the Wilson frame and all bony prominences were well padded.  The back was prepped and draped in a standard fashion. Time-out was taken, confirming the patient, procedure, and all other pertinent important data.  We identified the T9, T10, and T11 pedicles and marked out the incision site.  This was infiltrated with 1% Marcaine with lidocaine.  I made a midline thoracic incision, centered over the T10 spinous process.  Sharp dissection was carried out down to the deep fascia, and I stripped the deep fascia to expose the inferior portion  of the T9 spinous process and lamina and all of the T10 spinous process and lamina portion of that of T11.  I then obtained hemostasis using bipolar electrocautery and then used the fluoro to identify the T9 pedicle. Once this was done, I then removed the inferior portion of the T9 spinous process and used my 2 mm Kerrison punch to perform a laminotomy of T9.  I then used a Penfield 4 to dissect in the central raphe and used my 2 mm Kerrison to resect the ligamentum flavum and exposed the epidural fat.  I dissected through the epidural fat and identified the dorsal surface of the thecal sac.  Once this was identified, I then gently passed the trialing device which was easily passed up to the T7-8 disk space.  I obtained the implant and then again passed this up and there was no significant difficulty doing that.  I then secured leads 1 and 4 directly to the spinous process of T10 using FiberWire.  Once this was done, I made a second incision in the left gluteal region, dissected down to about a depth of 3.5 to 4 cm and made a pocket for the battery.  Using a submuscular passer, I passed the wires from the thoracic wound down to the gluteal wound.  I then obtained the battery  and connected all the leads and torqued off them, so they were tight according to manufacture's standards.  All leads were then tested and they were working satisfactorily according to the representative.  I then secured the battery to the deep fascia with 2 interrupted #1 Vicryl sutures.  I irrigated both wounds copiously with normal saline.  I made sure I had hemostasis in both wounds with bipolar electrocautery.  I then closed the deep fascia of both wounds with interrupted #1 Vicryl sutures, then a layered 2-0 Vicryl sutures, and then 3-0 Monocryl for the skin.  Steri-Strips and dry dressings were applied and the patient was ultimately extubated, transferred to the PACU without incident.  At the end of the case,  all needle and sponge counts were correct.     Dahari D. Shon BatonBrooks, M.D.     DDB/MEDQ  D:  05/18/2016  T:  05/19/2016  Job:  [id number]  cc:   Debria Garretahari D. Shon BatonBrooks, M.D.' Office

## 2016-05-19 NOTE — Evaluation (Signed)
Occupational Therapy Evaluation Patient Details Name: Alicia Logan MRN: [id number] DOB: January 05, 1971 Today's Date: 05/19/2016    History of Present Illness Pt is a 45 y.o. female s/p LUMBAR SPINAL CORD STIMULATOR INSERTION. PMHx: Anxiety, Chronic pain, Depression, Psychiatric disorder.    Clinical Impression   Pt reports she was independent with ADL PTA. Currently pt overall supervision for safety with ADL and functional mobility. All back, ADL, and safety education completed with pt. Pt planning to d/c home with supervision from family. No further acute OT needs identified; signing off at this time. Please re-consult if needs change. Thank you for this referral.    Follow Up Recommendations  No OT follow up;Supervision/Assistance - 24 hour (initially)    Equipment Recommendations  None recommended by OT    Recommendations for Other Services PT consult     Precautions / Restrictions Precautions Precautions: Fall Precaution Comments: Educated on back precautions for comfort and good back health. Restrictions Weight Bearing Restrictions: No      Mobility Bed Mobility Overal bed mobility: Needs Assistance Bed Mobility: Rolling;Sidelying to Sit Rolling: Supervision Sidelying to sit: Min guard       General bed mobility comments: VCs for technique. HOB flat without use of bed rails.  Transfers Overall transfer level: Needs assistance Equipment used: None Transfers: Sit to/from Stand Sit to Stand: Supervision         General transfer comment: Supervision for safety; no physical assist required.    Balance Overall balance assessment: No apparent balance deficits (not formally assessed)                                          ADL Overall ADL's : Needs assistance/impaired Eating/Feeding: Independent;Sitting   Grooming: Supervision/safety;Standing   Upper Body Bathing: Set up;Sitting   Lower Body Bathing: Supervison/ safety;Sit to/from  stand   Upper Body Dressing : Set up;Sitting   Lower Body Dressing: Supervision/safety;Sit to/from stand Lower Body Dressing Details (indicate cue type and reason): Pt able to cross foot over opposite knee. Educated pt on compensatory strategies for LB ADL. Toilet Transfer: Supervision/safety;Ambulation;BSC   Toileting- ArchitectClothing Manipulation and Hygiene: Supervision/safety;Sit to/from stand   Tub/ Shower Transfer: Supervision/safety;Tub transfer;Ambulation Tub/Shower Transfer Details (indicate cue type and reason): Simulated tub transfer. Pt able to perform with supervision for safety. Functional mobility during ADLs: Supervision/safety General ADL Comments: Educated pt on back precautions for comfort and good back health; pt verbalized understanding. Educated on bed mobility technique; pt able to return demo with HOB flat and no bed rail to simulate home environment.      Vision Vision Assessment?: No apparent visual deficits   Perception     Praxis      Pertinent Vitals/Pain Pain Assessment: Faces Faces Pain Scale: Hurts even more Pain Location: back Pain Descriptors / Indicators: Guarding;Grimacing;Aching Pain Intervention(s): Monitored during session     Hand Dominance     Extremity/Trunk Assessment Upper Extremity Assessment Upper Extremity Assessment: Overall WFL for tasks assessed   Lower Extremity Assessment Lower Extremity Assessment: Defer to PT evaluation   Cervical / Trunk Assessment Cervical / Trunk Assessment: Other exceptions Cervical / Trunk Exceptions: s/p sx   Communication Communication Communication: No difficulties   Cognition Arousal/Alertness: Awake/alert Behavior During Therapy: Anxious (tearful) Overall Cognitive Status: Within Functional Limits for tasks assessed  General Comments       Exercises       Shoulder Instructions      Home Living Family/patient expects to be discharged to:: Private  residence Living Arrangements: Parent (father) Available Help at Discharge: Family Type of Home: House       Home Layout: One level     Bathroom Shower/Tub: Tub/shower unit Shower/tub characteristics: Engineer, building services: Standard     Home Equipment: Shower seat          Prior Functioning/Environment Level of Independence: Independent        Comments: Drives. Does yoga.        OT Problem List:     OT Treatment/Interventions:      OT Goals(Current goals can be found in the care plan section) Acute Rehab OT Goals Patient Stated Goal: decrease pain OT Goal Formulation: All assessment and education complete, DC therapy  OT Frequency:     Barriers to D/C:            Co-evaluation              End of Session Equipment Utilized During Treatment: Gait belt Nurse Communication: Mobility status;Other (comment) (no equipment needs)  Activity Tolerance: Patient tolerated treatment well Patient left: Other (comment) (in hallway with PT)   Time: [phone] OT Time Calculation (min): 16 min Charges:  OT General Charges $OT Visit: 1 Procedure OT Evaluation $OT Eval Moderate Complexity: 1 Procedure G-Codes: OT G-codes **NOT FOR INPATIENT CLASS** Functional Assessment Tool Used: Clinical judgement Functional Limitation: Self care Self Care Current Status (V4098): At least 1 percent but less than 20 percent impaired, limited or restricted Self Care Goal Status (J1914): At least 1 percent but less than 20 percent impaired, limited or restricted Self Care Discharge Status [phone]): At least 1 percent but less than 20 percent impaired, limited or restricted   Gaye Alken M.S., OTR/L Pager: [phone]  05/19/2016, 9:21 AM

## 2016-06-13 NOTE — Discharge Summary (Signed)
Physician Discharge Summary  Patient ID: Alicia Logan MRN: [id number] DOB/AGE: [birth year] 45 y.o.  Admit date: 05/18/2016 Discharge date: 06/13/2016  Admission Diagnoses:  Chronic Pain syndrome  Discharge Diagnoses:  Active Problems:   Chronic pain   Past Medical History:  Diagnosis Date  . Anxiety   . Bipolar 1 disorder ([place])   . Chronic back pain   . Depression   . Headache     Surgeries: Procedure(s): LUMBAR SPINAL CORD STIMULATOR INSERTION on 05/18/2016   Consultants (if any):   Discharged Condition: Improved  Hospital Course: Alicia Logan is an 45 y.o. female who was admitted 05/18/2016 with a diagnosis of Chronic Pain Syndrome and went to the operating room on 05/18/2016 and underwent the above named procedures.  Post op day 1 pt reports incisional pain controlled on oral medication.  Pt is voiding w/o difficulty.  Pt ambulating in hallway.  Pt met with [facility] rep before DC.   She was given perioperative antibiotics:  Anti-infectives    Start     Dose/Rate Route Frequency Ordered Stop   05/18/16 2100  ceFAZolin (ANCEF) IVPB 1 g/50 mL premix     1 g 100 mL/hr over 30 Minutes Intravenous Every 8 hours 05/18/16 1731 05/19/16 0503   05/18/16 0955  ceFAZolin (ANCEF) 2-4 GM/100ML-% IVPB    Comments:  Leandrew Koyanagi   : cabinet override      05/18/16 0955 05/18/16 1320   05/18/16 0953  ceFAZolin (ANCEF) IVPB 2g/100 mL premix     2 g 200 mL/hr over 30 Minutes Intravenous 30 min pre-op 05/18/16 0953 05/18/16 1320    .  She was given sequential compression devices, early ambulation, and TED for DVT prophylaxis.  She benefited maximally from the hospital stay and there were no complications.    Recent vital signs:  Vitals:   05/19/16 0826 05/19/16 1237  BP: 123/72 (!) 101/43  Pulse: 93 69  Resp: 18 18  Temp: 98 F (36.7 C) 99 F (37.2 C)    Recent laboratory studies:  Lab Results  Component Value Date   HGB 12.6 05/15/2016   HGB 12.8 12/02/2012   HGB  14.9 10/29/2012   Lab Results  Component Value Date   WBC 8.6 05/15/2016   PLT 220 05/15/2016   No results found for: INR Lab Results  Component Value Date   NA 138 12/02/2012   K 2.8 (L) 12/02/2012   CL 101 12/02/2012   CO2 27 12/02/2012   BUN 10 12/02/2012   CREATININE 0.81 12/02/2012   GLUCOSE 80 12/02/2012    Discharge Medications:     Medication List    STOP taking these medications   BC HEADACHE PO   cephALEXin 500 MG capsule Commonly known as:  Lamar these medications   ALPRAZolam 1 MG tablet Commonly known as:  XANAX Take 1 mg by mouth 4 (four) times daily as needed for anxiety or sleep. For anxiety.   amphetamine-dextroamphetamine 20 MG tablet Commonly known as:  ADDERALL Take 20 mg by mouth daily.   buPROPion 150 MG 24 hr tablet Commonly known as:  WELLBUTRIN XL Take 450 mg by mouth daily.   cetirizine 10 MG tablet Commonly known as:  ZYRTEC Take 10 mg by mouth daily.   cyclobenzaprine 10 MG tablet Commonly known as:  FLEXERIL Take 10 mg by mouth 2 (two) times daily as needed for muscle spasms.   gabapentin 600 MG tablet Commonly known as:  NEURONTIN TAKE 1 TABLET  FOUR TIMES DAILY   glucosamine-chondroitin 500-400 MG tablet Take 2 tablets by mouth daily.   HAIR SKIN AND NAILS FORMULA PO Take 1 tablet by mouth daily.   lamoTRIgine 200 MG tablet Commonly known as:  LAMICTAL Take 400 mg by mouth daily.   ondansetron 4 MG tablet Commonly known as:  ZOFRAN Take 1 tablet (4 mg total) by mouth every 8 (eight) hours as needed for nausea or vomiting.   oxyCODONE 15 MG immediate release tablet Commonly known as:  ROXICODONE Take 15 mg by mouth every 6 (six) hours as needed for pain.   phentermine 37.5 MG tablet Commonly known as:  ADIPEX-P Take 37.5 mg by mouth every morning.   prazosin 5 MG capsule Commonly known as:  MINIPRESS Take 5 mg by mouth at bedtime.       Diagnostic Studies: Dg Thoracic Spine 2 View  Result  Date: 05/18/2016 CLINICAL DATA:  Fluoroscopic imaging provided for lumbar spine cord stimulator insertion. Fluoroscopy time 21 seconds. EXAM: THORACIC SPINE 2 VIEWS ; DG C-ARM 61-120 MIN COMPARISON:  None. FINDINGS: Imaging shows placement of spine stimulator leads along the dorsal aspect of the spinal canal at the T9 and T10 levels. IMPRESSION: Imaging provided for spine stimulator lead placement. Electronically Signed   By: Lajean Manes M.D.   On: 05/18/2016 15:10   Dg C-arm 1-60 Min  Result Date: 05/18/2016 CLINICAL DATA:  Fluoroscopic imaging provided for lumbar spine cord stimulator insertion. Fluoroscopy time 21 seconds. EXAM: THORACIC SPINE 2 VIEWS ; DG C-ARM 61-120 MIN COMPARISON:  None. FINDINGS: Imaging shows placement of spine stimulator leads along the dorsal aspect of the spinal canal at the T9 and T10 levels. IMPRESSION: Imaging provided for spine stimulator lead placement. Electronically Signed   By: Lajean Manes M.D.   On: 05/18/2016 15:10    Disposition: 01-Home or Self Care Pt given post op medications Pt will present to clinic in 2 weeks   Follow-up Information    BROOKS,DAHARI D, MD. Schedule an appointment as soon as possible for a visit in 2 weeks.   Specialty:  Orthopedic Surgery Why:  For suture removal, For wound re-check, If symptoms worsen Contact information: [address] [place] [address] [phone]            Signed: Valinda Hoar 06/13/2016, 8:09 AM

## 2016-06-28 ENCOUNTER — Encounter (HOSPITAL_COMMUNITY): Payer: Self-pay | Admitting: Emergency Medicine

## 2016-06-28 ENCOUNTER — Emergency Department (HOSPITAL_COMMUNITY)
Admission: EM | Admit: 2016-06-28 | Discharge: 2016-06-29 | Disposition: A | Discharge status: Other Acute Inpatient Hospital | Payer: Medicare Other | Attending: Emergency Medicine | Admitting: Emergency Medicine

## 2016-06-28 ENCOUNTER — Emergency Department (HOSPITAL_COMMUNITY): Payer: Medicare Other

## 2016-06-28 DIAGNOSIS — F17210 Nicotine dependence, cigarettes, uncomplicated: Secondary | ICD-10-CM | POA: Insufficient documentation

## 2016-06-28 DIAGNOSIS — F22 Delusional disorders: Secondary | ICD-10-CM | POA: Diagnosis not present

## 2016-06-28 DIAGNOSIS — F419 Anxiety disorder, unspecified: Secondary | ICD-10-CM

## 2016-06-28 DIAGNOSIS — R4182 Altered mental status, unspecified: Secondary | ICD-10-CM | POA: Diagnosis not present

## 2016-06-28 DIAGNOSIS — F1920 Other psychoactive substance dependence, uncomplicated: Secondary | ICD-10-CM | POA: Diagnosis present

## 2016-06-28 DIAGNOSIS — F1110 Opioid abuse, uncomplicated: Secondary | ICD-10-CM | POA: Diagnosis not present

## 2016-06-28 DIAGNOSIS — Z79899 Other long term (current) drug therapy: Secondary | ICD-10-CM | POA: Diagnosis not present

## 2016-06-28 DIAGNOSIS — F1510 Other stimulant abuse, uncomplicated: Secondary | ICD-10-CM | POA: Insufficient documentation

## 2016-06-28 DIAGNOSIS — F331 Major depressive disorder, recurrent, moderate: Secondary | ICD-10-CM | POA: Diagnosis not present

## 2016-06-28 LAB — COMPREHENSIVE METABOLIC PANEL
ALT: 12 U/L — ABNORMAL LOW (ref 14–54)
AST: 17 U/L (ref 15–41)
Albumin: 4.3 g/dL (ref 3.5–5.0)
Alkaline Phosphatase: 81 U/L (ref 38–126)
Anion gap: 10 (ref 5–15)
BUN: 11 mg/dL (ref 6–20)
CO2: 25 mmol/L (ref 22–32)
Calcium: 9.8 mg/dL (ref 8.9–10.3)
Chloride: 104 mmol/L (ref 101–111)
Creatinine, Ser: 0.91 mg/dL (ref 0.44–1.00)
GFR calc Af Amer: >60 mL/min (ref >60)
GFR calc non Af Amer: >60 mL/min (ref >60)
Glucose, Bld: 116 mg/dL — ABNORMAL HIGH (ref 65–99)
Potassium: 3.8 mmol/L (ref 3.5–5.1)
Sodium: 139 mmol/L (ref 135–145)
Total Bilirubin: 1.2 mg/dL (ref 0.3–1.2)
Total Protein: 7.6 g/dL (ref 6.5–8.1)

## 2016-06-28 LAB — RAPID URINE DRUG SCREEN, HOSP PERFORMED
Amphetamines: POSITIVE — AB
Barbiturates: NOT DETECTED
Benzodiazepines: POSITIVE — AB
Cocaine: NOT DETECTED
Opiates: POSITIVE — AB
Tetrahydrocannabinol: NOT DETECTED

## 2016-06-28 LAB — CBC
HCT: 39 % (ref 36.0–46.0)
Hemoglobin: 12.9 g/dL (ref 12.0–15.0)
MCH: 30.2 pg (ref 26.0–34.0)
MCHC: 33.1 g/dL (ref 30.0–36.0)
MCV: 91.3 fL (ref 78.0–100.0)
Platelets: 271 10*3/uL (ref 150–400)
RBC: 4.27 MIL/uL (ref 3.87–5.11)
RDW: 13.2 % (ref 11.5–15.5)
WBC: 7.3 10*3/uL (ref 4.0–10.5)

## 2016-06-28 LAB — URINALYSIS, ROUTINE W REFLEX MICROSCOPIC
Glucose, UA: NEGATIVE mg/dL
Hgb urine dipstick: NEGATIVE
Ketones, ur: 5 mg/dL — AB
Leukocytes, UA: NEGATIVE
Nitrite: NEGATIVE
Protein, ur: 100 mg/dL — AB
Specific Gravity, Urine: 1.035 — ABNORMAL HIGH (ref 1.005–1.030)
pH: 6 (ref 5.0–8.0)

## 2016-06-28 LAB — PREGNANCY, URINE: Preg Test, Ur: NEGATIVE

## 2016-06-28 LAB — I-STAT BETA HCG BLOOD, ED (MC, WL, AP ONLY): I-stat hCG, quantitative: <5 m[IU]/mL (ref <5)

## 2016-06-28 LAB — SALICYLATE LEVEL: Salicylate Lvl: <7 mg/dL (ref 2.8–30.0)

## 2016-06-28 LAB — ETHANOL: Alcohol, Ethyl (B): <5 mg/dL (ref <5)

## 2016-06-28 LAB — ACETAMINOPHEN LEVEL: Acetaminophen (Tylenol), Serum: <10 ug/mL — ABNORMAL LOW (ref 10–30)

## 2016-06-28 MED ORDER — LORAZEPAM 1 MG PO TABS
2.0000 mg | ORAL_TABLET | Freq: Once | ORAL | Status: AC
  Administered 2016-06-28: 2 mg via ORAL
  Filled 2016-06-28: qty 2

## 2016-06-28 MED ORDER — ALUM & MAG HYDROXIDE-SIMETH 200-200-20 MG/5ML PO SUSP
30.0000 mL | ORAL | Status: DC | PRN
Start: 2016-06-28 — End: 2016-06-29

## 2016-06-28 MED ORDER — LORAZEPAM 1 MG PO TABS: 0.0000 mg | ORAL_TABLET | Freq: Two times a day (BID) | ORAL | Status: DC

## 2016-06-28 MED ORDER — LORAZEPAM 2 MG/ML IJ SOLN: 1.0000 mg | Freq: Once | INTRAMUSCULAR | Status: DC

## 2016-06-28 MED ORDER — LORAZEPAM 1 MG PO TABS: 0.0000 mg | ORAL_TABLET | Freq: Four times a day (QID) | ORAL | Status: DC

## 2016-06-28 MED ORDER — ACETAMINOPHEN 325 MG PO TABS
650.0000 mg | ORAL_TABLET | ORAL | Status: DC | PRN
  Administered 2016-06-29 (×2): 650 mg via ORAL
  Filled 2016-06-28 (×2): qty 2

## 2016-06-28 MED ORDER — LORAZEPAM 1 MG PO TABS: 1.0000 mg | ORAL_TABLET | Freq: Three times a day (TID) | ORAL | Status: DC | PRN

## 2016-06-28 MED ORDER — THIAMINE HCL 100 MG/ML IJ SOLN: 100.0000 mg | Freq: Every day | INTRAMUSCULAR | Status: DC

## 2016-06-28 MED ORDER — NICOTINE 21 MG/24HR TD PT24
21.0000 mg | MEDICATED_PATCH | Freq: Every day | TRANSDERMAL | Status: DC
Start: 2016-06-28 — End: 2016-06-29
  Filled 2016-06-28: qty 1

## 2016-06-28 MED ORDER — VITAMIN B-1 100 MG PO TABS
100.0000 mg | ORAL_TABLET | Freq: Every day | ORAL | Status: DC
  Administered 2016-06-28 – 2016-06-29 (×2): 100 mg via ORAL
  Filled 2016-06-28 (×2): qty 1

## 2016-06-28 MED ORDER — ONDANSETRON HCL 4 MG PO TABS: 4.0000 mg | ORAL_TABLET | Freq: Three times a day (TID) | ORAL | Status: DC | PRN

## 2016-06-28 NOTE — ED Triage Notes (Signed)
Pt comes in to ED w/ c/o difficulty/thinking x2 days. Pt also c/o chronic back pain. Pt has hx of bipolar, anxiety, and depression. Pt reports not taking psychiatric medications approx 1 month. Denies HI/SI ideations. Pt confirms visual hallucinations/"visions" of Jesus, Corrie DandyMary and the cross. Pt tearful, sad, and anxious during triage.

## 2016-06-28 NOTE — [facility] Counselor (Signed)
Clinician spoke to [place]aine, [place] and expressed she attempted to engage the pt in the assessment however pt began inaudibly rambling then she went back to sleep and could not be roused. Clinician asked Consuella Loselaine, RN if she could ask Dr. Erma HeritageIsaacs to removed the TTS consult, and resubmit when the pt is alert.    Gwinda Passereylese D Bennett, MS, [facility], [facility] Triage Specialist [phone]
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
## 2016-06-28 NOTE — ED Notes (Signed)
Mother at bedside.

## 2016-06-28 NOTE — ED Notes (Signed)
Patient transported to CT 

## 2016-06-29 ENCOUNTER — Encounter (HOSPITAL_COMMUNITY): Payer: Self-pay | Admitting: *Deleted

## 2016-06-29 ENCOUNTER — Inpatient Hospital Stay (HOSPITAL_COMMUNITY)
Admission: AD | Admit: 2016-06-29 | Discharge: 2016-07-04 | DRG: 885 | Disposition: A | Discharge status: Home / Self Care | Payer: Medicare Other | Attending: Psychiatry | Admitting: Psychiatry

## 2016-06-29 DIAGNOSIS — F29 Unspecified psychosis not due to a substance or known physiological condition: Secondary | ICD-10-CM | POA: Diagnosis present

## 2016-06-29 DIAGNOSIS — M545 Low back pain: Secondary | ICD-10-CM | POA: Diagnosis present

## 2016-06-29 DIAGNOSIS — F1590 Other stimulant use, unspecified, uncomplicated: Secondary | ICD-10-CM | POA: Diagnosis present

## 2016-06-29 DIAGNOSIS — G8929 Other chronic pain: Secondary | ICD-10-CM | POA: Diagnosis present

## 2016-06-29 DIAGNOSIS — Z8249 Family history of ischemic heart disease and other diseases of the circulatory system: Secondary | ICD-10-CM | POA: Diagnosis not present

## 2016-06-29 DIAGNOSIS — Z818 Family history of other mental and behavioral disorders: Secondary | ICD-10-CM

## 2016-06-29 DIAGNOSIS — G894 Chronic pain syndrome: Secondary | ICD-10-CM | POA: Clinically undetermined

## 2016-06-29 DIAGNOSIS — F332 Major depressive disorder, recurrent severe without psychotic features: Secondary | ICD-10-CM | POA: Diagnosis present

## 2016-06-29 DIAGNOSIS — F1120 Opioid dependence, uncomplicated: Secondary | ICD-10-CM | POA: Diagnosis present

## 2016-06-29 DIAGNOSIS — Z79899 Other long term (current) drug therapy: Secondary | ICD-10-CM

## 2016-06-29 DIAGNOSIS — Z23 Encounter for immunization: Secondary | ICD-10-CM | POA: Diagnosis present

## 2016-06-29 DIAGNOSIS — F315 Bipolar disorder, current episode depressed, severe, with psychotic features: Secondary | ICD-10-CM | POA: Diagnosis present

## 2016-06-29 DIAGNOSIS — F1320 Sedative, hypnotic or anxiolytic dependence, uncomplicated: Secondary | ICD-10-CM | POA: Diagnosis present

## 2016-06-29 DIAGNOSIS — F1920 Other psychoactive substance dependence, uncomplicated: Secondary | ICD-10-CM | POA: Diagnosis present

## 2016-06-29 DIAGNOSIS — F17210 Nicotine dependence, cigarettes, uncomplicated: Secondary | ICD-10-CM | POA: Diagnosis present

## 2016-06-29 DIAGNOSIS — G4700 Insomnia, unspecified: Secondary | ICD-10-CM | POA: Diagnosis present

## 2016-06-29 DIAGNOSIS — F419 Anxiety disorder, unspecified: Secondary | ICD-10-CM | POA: Diagnosis not present

## 2016-06-29 DIAGNOSIS — F331 Major depressive disorder, recurrent, moderate: Secondary | ICD-10-CM | POA: Diagnosis present

## 2016-06-29 DIAGNOSIS — Z823 Family history of stroke: Secondary | ICD-10-CM | POA: Diagnosis not present

## 2016-06-29 DIAGNOSIS — Z9689 Presence of other specified functional implants: Secondary | ICD-10-CM

## 2016-06-29 DIAGNOSIS — Z9889 Other specified postprocedural states: Secondary | ICD-10-CM

## 2016-06-29 MED ORDER — NICOTINE 21 MG/24HR TD PT24
21.0000 mg | MEDICATED_PATCH | Freq: Every day | TRANSDERMAL | Status: DC
  Administered 2016-06-30 – 2016-07-02 (×2): 21 mg via TRANSDERMAL
  Filled 2016-06-29 (×5): qty 1

## 2016-06-29 MED ORDER — ACETAMINOPHEN 325 MG PO TABS: 650.0000 mg | ORAL_TABLET | ORAL | Status: DC | PRN

## 2016-06-29 MED ORDER — LORAZEPAM 1 MG PO TABS: 1.0000 mg | ORAL_TABLET | Freq: Four times a day (QID) | ORAL | Status: DC | PRN

## 2016-06-29 MED ORDER — LORAZEPAM 1 MG PO TABS
0.0000 mg | ORAL_TABLET | Freq: Two times a day (BID) | ORAL | Status: DC
Start: 2016-07-01 — End: 2016-06-30

## 2016-06-29 MED ORDER — MAGNESIUM HYDROXIDE 400 MG/5ML PO SUSP: 30.0000 mL | Freq: Every day | ORAL | Status: DC | PRN

## 2016-06-29 MED ORDER — ONDANSETRON HCL 4 MG PO TABS: 4.0000 mg | ORAL_TABLET | Freq: Three times a day (TID) | ORAL | Status: DC | PRN

## 2016-06-29 MED ORDER — ADULT MULTIVITAMIN W/MINERALS CH
1.0000 | ORAL_TABLET | Freq: Every day | ORAL | Status: DC
  Administered 2016-06-29: 1 via ORAL
  Filled 2016-06-29: qty 1

## 2016-06-29 MED ORDER — LORAZEPAM 1 MG PO TABS
1.0000 mg | ORAL_TABLET | Freq: Four times a day (QID) | ORAL | Status: DC | PRN
  Filled 2016-06-29: qty 1

## 2016-06-29 MED ORDER — LORAZEPAM 2 MG/ML IJ SOLN: 1.0000 mg | Freq: Four times a day (QID) | INTRAMUSCULAR | Status: DC | PRN

## 2016-06-29 MED ORDER — INFLUENZA VAC SPLIT QUAD 0.5 ML IM SUSY
0.5000 mL | PREFILLED_SYRINGE | INTRAMUSCULAR | Status: DC
  Filled 2016-06-29: qty 0.5

## 2016-06-29 MED ORDER — ADULT MULTIVITAMIN W/MINERALS CH
1.0000 | ORAL_TABLET | Freq: Every day | ORAL | Status: DC
  Administered 2016-06-30: 1 via ORAL
  Filled 2016-06-29 (×2): qty 1

## 2016-06-29 MED ORDER — LORAZEPAM 1 MG PO TABS
0.0000 mg | ORAL_TABLET | Freq: Four times a day (QID) | ORAL | Status: DC
  Administered 2016-06-30 (×2): 1 mg via ORAL
  Filled 2016-06-29 (×2): qty 1

## 2016-06-29 MED ORDER — LORAZEPAM 1 MG PO TABS
0.0000 mg | ORAL_TABLET | Freq: Four times a day (QID) | ORAL | Status: DC
  Administered 2016-06-29 (×2): 1 mg via ORAL
  Filled 2016-06-29 (×2): qty 1

## 2016-06-29 MED ORDER — FOLIC ACID 1 MG PO TABS
1.0000 mg | ORAL_TABLET | Freq: Every day | ORAL | Status: DC
  Administered 2016-06-29: 1 mg via ORAL
  Filled 2016-06-29: qty 1

## 2016-06-29 MED ORDER — ALUM & MAG HYDROXIDE-SIMETH 200-200-20 MG/5ML PO SUSP: 30.0000 mL | ORAL | Status: DC | PRN

## 2016-06-29 MED ORDER — LORAZEPAM 1 MG PO TABS: 0.0000 mg | ORAL_TABLET | Freq: Two times a day (BID) | ORAL | Status: DC

## 2016-06-29 MED ORDER — FOLIC ACID 1 MG PO TABS
1.0000 mg | ORAL_TABLET | Freq: Every day | ORAL | Status: DC
  Administered 2016-06-30 – 2016-07-04 (×5): 1 mg via ORAL
  Filled 2016-06-29 (×7): qty 1

## 2016-06-29 NOTE — ED Notes (Signed)
Pt admitted to room #40. Pt drowsy, withdrawn on approach. Speech pressured. Pt denies SI/HI, Denies AVH. Pt reports "my family wants me to get clean and stop overusing medication." Pt reports she is taking to much oxycodone. Pt reports poor appetite over the past 3 months. Pt reports she has been off psychiatric medication for 1 month. Special checks q 15 mins in place for safety. Video monitoring in place.

## 2016-06-29 NOTE — ED Notes (Signed)
Pt up at nurses station requesting her emergency contact phone number so that she can call a ride to pick her up from the hospital.

## 2016-06-29 NOTE — Progress Notes (Signed)
Patient ID: Alicia Logan, female   DOB: 10/13/1970, 45 y.o.   MRN: [id number] Per State regulations 482.30 this chart was reviewed for medical necessity with respect to the patient's admission/duration of stay.    Next review date: 07/03/16  Thurman CoyerEric Kaplan, BSN, RN-BC  Case Manager

## 2016-06-29 NOTE — ED Notes (Signed)
Pt denies SI/HI/AVH.  Pt states receipt of all belongings.

## 2016-06-29 NOTE — BH Assessment (Addendum)
Assessment Note  Alicia SportsmanKathryn Logan is a 45 y.o. female who [facility] due to extreme anxiety. Pt was not able to verbalize appropriately her reason for presenting to the ED. She was clearly in a disorganized, manic, & paranoid state. She expressed frustration about people outside her door (the sitter and security) talking about her and saying that her sister and mom had come by to see her (clinician verified with sitter and security that this was untrue). Pt's speech was so rapid and pressured that it oftentimes came out as gibberish, nonsensical words. Pt was able to deny SI, HI, AVH. She did say at one point, however, that she came to the ED b/c she was hearing things and thought someone was in the house with her. Pt then said that she came to the ED because of bad back pain.   Diagnosis: Bipolar I d/o  Past Medical History:  Past Medical History:  Diagnosis Date  . Anxiety   . Bipolar 1 disorder (HCC)   . Chronic back pain   . Depression   . Headache     Past Surgical History:  Procedure Laterality Date  . DENTAL SURGERY    . NASAL SEPTUM SURGERY    . SPINAL CORD STIMULATOR INSERTION N/A 05/18/2016   Procedure: LUMBAR SPINAL CORD STIMULATOR INSERTION;  Surgeon: Venita Lickahari Brooks, MD;  Location: [facility] OR;  Service: Orthopedics;  Laterality: N/A;    Family History:  Family History  Problem Relation Age of Onset  . Depression Mother   . Hypertension Father   . Heart failure Father   . Stroke Father     Social History:  reports that she has been smoking Cigarettes.  She has a 10.00 pack-year smoking history. She has never used smokeless tobacco. She reports that she uses drugs, including Marijuana. She reports that she does not drink alcohol.  Additional Social History:  Alcohol / Drug Use Pain Medications: see PTA meds Prescriptions: see PTA meds Over the Counter: see PTA meds History of alcohol / drug use?: No history of alcohol / drug abuse  CIWA: CIWA-Ar BP:  111/67 Pulse Rate: 86 Nausea and Vomiting: no nausea and no vomiting Tactile Disturbances: none Tremor: no tremor Auditory Disturbances: not present Paroxysmal Sweats: no sweat visible Visual Disturbances: not present Anxiety: no anxiety, at ease Headache, Fullness in Head: none present Agitation: normal activity Orientation and Clouding of Sensorium: oriented and can do serial additions CIWA-Ar Total: 0 COWS:    Allergies:  Allergies  Allergen Reactions  . Caffeine   . Diclofenac Other (See Comments)    Flu like symptoms   . Shellfish Allergy   . Sulfa Antibiotics Nausea And Vomiting and Other (See Comments)    Unknown     Home Medications:  (Not in a hospital admission)  OB/GYN Status:  No LMP recorded. Patient is not currently having periods (Reason: Irregular Periods).  General Assessment Data Location of Assessment: [facility] TTS Assessment: In system Is this a Tele or Face-to-Face Assessment?: Face-to-Face Is this an Initial Assessment or a Re-assessment for this encounter?: Initial Assessment Is patient pregnant?: No Pregnancy Status: No Admission Status: Voluntary Is patient capable of signing voluntary admission?: Yes Referral Source: Self/Family/Friend Insurance type: Medicare        Education Status Is patient currently in school?: No  Risk to self with the past 6 months Suicidal Ideation: No Has patient been a risk to self within the past 6 months prior to admission? : No Suicidal Intent:  No Has patient had any suicidal intent within the past 6 months prior to admission? : No Is patient at risk for suicide?: No Suicidal Plan?: No Has patient had any suicidal plan within the past 6 months prior to admission? : No Access to Means: No Previous Attempts/Gestures: No Intentional Self Injurious Behavior: None Family Suicide History: Unknown Persecutory voices/beliefs?: Yes Depression: Yes Depression Symptoms: Feeling angry/irritable Substance abuse  history and/or treatment for substance abuse?: No Suicide prevention information given to non-admitted patients: Not applicable  Risk to Others within the past 6 months Homicidal Ideation: No Does patient have any lifetime risk of violence toward others beyond the six months prior to admission? : No Thoughts of Harm to Others: No Current Homicidal Intent: No Current Homicidal Plan: No Access to Homicidal Means: No History of harm to others?: No Assessment of Violence: None Noted Does patient have access to weapons?: No Criminal Charges Pending?: No Does patient have a court date: No Is patient on probation?: No  Psychosis Hallucinations: Auditory Delusions: Unspecified  Mental Status Report Appearance/Hygiene: Unremarkable Eye Contact: Fair Motor Activity: Freedom of movement, Agitation Speech: Incoherent, Rapid, Pressured Level of Consciousness: Alert Mood: Anxious, Angry, Irritable Affect: Appropriate to circumstance Anxiety Level: Moderate Thought Processes: Irrelevant, Flight of Ideas Judgement: Impaired Orientation: Person, Place Obsessive Compulsive Thoughts/Behaviors: Unable to Assess  Cognitive Functioning Concentration: Decreased Memory: Unable to Assess IQ: Average Insight: Poor Impulse Control: Unable to Assess Vegetative Symptoms: Unable to Assess  ADLScreening [facility] Assessment Services) Patient's cognitive ability adequate to safely complete daily activities?: Yes Patient able to express need for assistance with ADLs?: Yes Independently performs ADLs?: Yes (appropriate for developmental age)     Prior Outpatient Therapy Does patient have an ACCT team?: No Does patient have Intensive In-House Services?  : No Does patient have [facility] services? : Unknown Does patient have P4CC services?: No  ADL Screening (condition at time of admission) Patient's cognitive ability adequate to safely complete daily activities?: Yes Is the patient deaf or have  difficulty hearing?: No Does the patient have difficulty seeing, even when wearing glasses/contacts?: No Does the patient have difficulty concentrating, remembering, or making decisions?: Yes Patient able to express need for assistance with ADLs?: Yes Does the patient have difficulty dressing or bathing?: No Independently performs ADLs?: Yes (appropriate for developmental age) Does the patient have difficulty walking or climbing stairs?: No Weakness of Legs: None Weakness of Arms/Hands: None  Home Assistive Devices/Equipment Home Assistive Devices/Equipment: None          Advance Directives (For Healthcare) Does Patient Have a Medical Advance Directive?: No Would patient like information on creating a medical advance directive?: No - Patient declined    Additional Information 1:1 In Past 12 Months?: No CIRT Risk: No Elopement Risk: No Does patient have medical clearance?: Yes     Disposition:  Disposition Initial Assessment Completed for this Encounter:  (consulted with Dr. Jannifer FranklinAkintayo & Nanine MeansJamison Lord, DNP) Disposition of Patient: Inpatient treatment program Type of inpatient treatment program: Adult  On Site Evaluation by:   Reviewed with Physician:    Laddie AquasSamantha M Taylor 06/29/2016 9:44 AM

## 2016-06-29 NOTE — BH Assessment (Signed)
[facility] Assessment Progress Note  Pt's nurse, Morrie Sheldonshley, reports that pt is asking to leave the ED.  This has been staffed with Nanine MeansJamison Lord, DNP and with EDP Mancel BaleElliott Wentz, MD, and it has been determined that pt meets criteria for IVC.  Dr Effie ShyWentz has initiated IVC and documents [place] [facility].  At [employer]15:12 Magistrate Watts confirms receipt.  As of this writing, service of Findings and Custody Order is pending.  Morrie Sheldonshley has been notified.  Doylene Canninghomas Hughes, MA Triage Specialist [phone]
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
## 2016-06-29 NOTE — Progress Notes (Signed)
06/29/16 1354:  Pt was lying down awake.  Pt had a visitor.  LRT introduced self and asked pt if she would be interested in doing any activities.  Pt stated she was in a lot of pain and declined activities.  Caroll RancherMarjette Lindsay, LRT/CTRS

## 2016-06-29 NOTE — ED Notes (Signed)
This nurse informed by Corliss ParishJameson,NP that a message has been left for pt representative for pt TENs Unit. Darryll CapersBrandon Myers [phone] to assist pt with management of TENS unit.

## 2016-06-29 NOTE — ED Notes (Signed)
Pt mother at bedside visiting with pt.  

## 2016-06-29 NOTE — ED Provider Notes (Signed)
Was contacted by behavioral health nurse informing me that the patient had been accepted at behavioral health by Dr. Elna BreslowEAPPEN. I personally examined the patient prior to transfer. She denied any acute complaints. She is safe for transfer.   Nira ConnPedro Eduardo Naphtali Zywicki, MD 06/30/16 [phone]

## 2016-06-29 NOTE — ED Notes (Signed)
Pt mother brought in remote for pt TENs unit. This nurse consulted Corliss ParishJameson,NP and ED charge [facility],RN.

## 2016-06-29 NOTE — ED Notes (Signed)
Pt at hallway phone.

## 2016-06-29 NOTE — ED Notes (Signed)
Patient picked up by [employer]/

## 2016-06-29 NOTE — ED Notes (Signed)
Patient woke to attempt TTS consult but was unable to fully participate. Patient went back to sleep during consult. Consult will be attempted again when patient is more alert.

## 2016-06-29 NOTE — Tx Team (Signed)
Initial Treatment Plan 06/29/2016 11:23 PM Alicia SportsmanKathryn Sage [id number]    PATIENT STRESSORS: Other: Father's terminally ill, Living situation, Psychosis   PATIENT STRENGTHS: Communication skills Physical Health Supportive family/friends   PATIENT IDENTIFIED PROBLEMS: Psychosis  Depression  "Better grief counseling"  "Better coping mechanisms"               DISCHARGE CRITERIA:  Ability to meet basic life and health needs Adequate post-discharge living arrangements Improved stabilization in mood, thinking, and/or behavior Motivation to continue treatment in a less acute level of care Need for constant or close observation no longer present Verbal commitment to aftercare and medication compliance Withdrawal symptoms are absent or subacute and managed without 24-hour nursing intervention  PRELIMINARY DISCHARGE PLAN: Outpatient therapy Placement in alternative living arrangements  PATIENT/FAMILY INVOLVEMENT: This treatment plan has been presented to and reviewed with the patient, Alicia Logan.  The patient and family have been given the opportunity to ask questions and make suggestions.  Alicia Logan, Alicia P, RN 06/29/2016, 11:23 PM

## 2016-06-29 NOTE — ED Notes (Signed)
Pt mother at bedside, pt presents with anxiety.

## 2016-06-30 ENCOUNTER — Encounter (HOSPITAL_COMMUNITY): Payer: Self-pay | Admitting: Psychiatry

## 2016-06-30 DIAGNOSIS — Z818 Family history of other mental and behavioral disorders: Secondary | ICD-10-CM

## 2016-06-30 DIAGNOSIS — Z823 Family history of stroke: Secondary | ICD-10-CM

## 2016-06-30 DIAGNOSIS — Z91013 Allergy to seafood: Secondary | ICD-10-CM

## 2016-06-30 DIAGNOSIS — Z888 Allergy status to other drugs, medicaments and biological substances status: Secondary | ICD-10-CM

## 2016-06-30 DIAGNOSIS — Z91018 Allergy to other foods: Secondary | ICD-10-CM

## 2016-06-30 DIAGNOSIS — Z9889 Other specified postprocedural states: Secondary | ICD-10-CM

## 2016-06-30 DIAGNOSIS — F1120 Opioid dependence, uncomplicated: Secondary | ICD-10-CM | POA: Clinically undetermined

## 2016-06-30 DIAGNOSIS — Z8249 Family history of ischemic heart disease and other diseases of the circulatory system: Secondary | ICD-10-CM

## 2016-06-30 DIAGNOSIS — Z882 Allergy status to sulfonamides status: Secondary | ICD-10-CM

## 2016-06-30 DIAGNOSIS — F1590 Other stimulant use, unspecified, uncomplicated: Secondary | ICD-10-CM | POA: Clinically undetermined

## 2016-06-30 DIAGNOSIS — Z79899 Other long term (current) drug therapy: Secondary | ICD-10-CM

## 2016-06-30 DIAGNOSIS — Z9689 Presence of other specified functional implants: Secondary | ICD-10-CM

## 2016-06-30 DIAGNOSIS — G894 Chronic pain syndrome: Secondary | ICD-10-CM | POA: Clinically undetermined

## 2016-06-30 DIAGNOSIS — F315 Bipolar disorder, current episode depressed, severe, with psychotic features: Secondary | ICD-10-CM | POA: Clinically undetermined

## 2016-06-30 DIAGNOSIS — F1320 Sedative, hypnotic or anxiolytic dependence, uncomplicated: Secondary | ICD-10-CM | POA: Clinically undetermined

## 2016-06-30 MED ORDER — HYDROXYZINE HCL 25 MG PO TABS
25.0000 mg | ORAL_TABLET | Freq: Four times a day (QID) | ORAL | Status: AC | PRN
  Administered 2016-07-01 – 2016-07-02 (×3): 25 mg via ORAL
  Filled 2016-06-30 (×3): qty 1

## 2016-06-30 MED ORDER — DICYCLOMINE HCL 20 MG PO TABS
20.0000 mg | ORAL_TABLET | Freq: Four times a day (QID) | ORAL | Status: DC | PRN
  Administered 2016-07-01 – 2016-07-02 (×2): 20 mg via ORAL
  Filled 2016-06-30 (×3): qty 1

## 2016-06-30 MED ORDER — CHLORDIAZEPOXIDE HCL 25 MG PO CAPS
25.0000 mg | ORAL_CAPSULE | Freq: Every day | ORAL | Status: AC
  Administered 2016-07-04: 25 mg via ORAL
  Filled 2016-06-30: qty 1

## 2016-06-30 MED ORDER — VITAMIN B-1 100 MG PO TABS
100.0000 mg | ORAL_TABLET | Freq: Every day | ORAL | Status: DC
  Administered 2016-07-01 – 2016-07-04 (×4): 100 mg via ORAL
  Filled 2016-06-30 (×7): qty 1

## 2016-06-30 MED ORDER — RISPERIDONE 0.5 MG PO TABS
0.5000 mg | ORAL_TABLET | Freq: Every day | ORAL | Status: DC
  Administered 2016-06-30 – 2016-07-03 (×4): 0.5 mg via ORAL
  Filled 2016-06-30 (×6): qty 1

## 2016-06-30 MED ORDER — ONDANSETRON 4 MG PO TBDP
4.0000 mg | ORAL_TABLET | Freq: Four times a day (QID) | ORAL | Status: AC | PRN
  Administered 2016-07-01 (×3): 4 mg via ORAL
  Filled 2016-06-30 (×3): qty 1

## 2016-06-30 MED ORDER — LOPERAMIDE HCL 2 MG PO CAPS: 2.0000 mg | ORAL_CAPSULE | ORAL | Status: AC | PRN

## 2016-06-30 MED ORDER — THIAMINE HCL 100 MG/ML IJ SOLN
100.0000 mg | Freq: Once | INTRAMUSCULAR | Status: AC
  Administered 2016-06-30: 100 mg via INTRAMUSCULAR
  Filled 2016-06-30: qty 2

## 2016-06-30 MED ORDER — OLANZAPINE 5 MG PO TABS
5.0000 mg | ORAL_TABLET | Freq: Three times a day (TID) | ORAL | Status: DC | PRN
  Administered 2016-07-01 – 2016-07-04 (×4): 5 mg via ORAL
  Filled 2016-06-30 (×5): qty 2

## 2016-06-30 MED ORDER — DULOXETINE HCL 20 MG PO CPEP
20.0000 mg | ORAL_CAPSULE | Freq: Every day | ORAL | Status: DC
  Administered 2016-06-30 – 2016-07-04 (×5): 20 mg via ORAL
  Filled 2016-06-30 (×8): qty 1

## 2016-06-30 MED ORDER — CHLORDIAZEPOXIDE HCL 25 MG PO CAPS
25.0000 mg | ORAL_CAPSULE | Freq: Four times a day (QID) | ORAL | Status: AC | PRN
  Administered 2016-07-01: 25 mg via ORAL
  Filled 2016-06-30: qty 1

## 2016-06-30 MED ORDER — BENZTROPINE MESYLATE 0.5 MG PO TABS
0.5000 mg | ORAL_TABLET | Freq: Every day | ORAL | Status: DC
  Administered 2016-06-30 – 2016-07-03 (×4): 0.5 mg via ORAL
  Filled 2016-06-30 (×6): qty 1

## 2016-06-30 MED ORDER — CLONIDINE HCL 0.1 MG PO TABS
0.1000 mg | ORAL_TABLET | Freq: Four times a day (QID) | ORAL | Status: AC
  Administered 2016-06-30 – 2016-07-02 (×7): 0.1 mg via ORAL
  Filled 2016-06-30 (×9): qty 1

## 2016-06-30 MED ORDER — TRAZODONE HCL 50 MG PO TABS
50.0000 mg | ORAL_TABLET | Freq: Every day | ORAL | Status: DC
  Administered 2016-06-30 – 2016-07-03 (×4): 50 mg via ORAL
  Filled 2016-06-30 (×6): qty 1

## 2016-06-30 MED ORDER — ACETAMINOPHEN 325 MG PO TABS
650.0000 mg | ORAL_TABLET | ORAL | Status: DC | PRN
  Administered 2016-06-30 – 2016-07-04 (×6): 650 mg via ORAL
  Filled 2016-06-30 (×6): qty 2

## 2016-06-30 MED ORDER — METHOCARBAMOL 500 MG PO TABS
500.0000 mg | ORAL_TABLET | Freq: Three times a day (TID) | ORAL | Status: DC | PRN
  Administered 2016-06-30 – 2016-07-04 (×7): 500 mg via ORAL
  Filled 2016-06-30 (×8): qty 1

## 2016-06-30 MED ORDER — CARBAMAZEPINE ER 100 MG PO TB12
100.0000 mg | ORAL_TABLET | Freq: Two times a day (BID) | ORAL | Status: DC
Start: 2016-06-30 — End: 2016-07-04
  Administered 2016-06-30 – 2016-07-04 (×8): 100 mg via ORAL
  Filled 2016-06-30 (×12): qty 1

## 2016-06-30 MED ORDER — CLONIDINE HCL 0.1 MG PO TABS
0.1000 mg | ORAL_TABLET | ORAL | Status: DC
  Administered 2016-07-02 – 2016-07-04 (×3): 0.1 mg via ORAL
  Filled 2016-06-30 (×5): qty 1

## 2016-06-30 MED ORDER — OLANZAPINE 10 MG IM SOLR
5.0000 mg | Freq: Three times a day (TID) | INTRAMUSCULAR | Status: DC | PRN
Start: 2016-06-30 — End: 2016-07-04

## 2016-06-30 MED ORDER — ADULT MULTIVITAMIN W/MINERALS CH
1.0000 | ORAL_TABLET | Freq: Every day | ORAL | Status: DC
  Filled 2016-06-30 (×2): qty 1

## 2016-06-30 MED ORDER — CHLORDIAZEPOXIDE HCL 25 MG PO CAPS
25.0000 mg | ORAL_CAPSULE | Freq: Three times a day (TID) | ORAL | Status: AC
  Administered 2016-07-01 – 2016-07-02 (×3): 25 mg via ORAL
  Filled 2016-06-30 (×2): qty 1

## 2016-06-30 MED ORDER — LIDOCAINE 5 % EX PTCH
2.0000 | MEDICATED_PATCH | CUTANEOUS | Status: DC
  Administered 2016-06-30: 2 via TRANSDERMAL
  Filled 2016-06-30 (×4): qty 2

## 2016-06-30 MED ORDER — CHLORDIAZEPOXIDE HCL 25 MG PO CAPS
25.0000 mg | ORAL_CAPSULE | ORAL | Status: AC
  Administered 2016-07-02 – 2016-07-03 (×2): 25 mg via ORAL
  Filled 2016-06-30 (×2): qty 1

## 2016-06-30 MED ORDER — CHLORDIAZEPOXIDE HCL 25 MG PO CAPS
25.0000 mg | ORAL_CAPSULE | Freq: Four times a day (QID) | ORAL | Status: AC
  Administered 2016-06-30 (×3): 25 mg via ORAL
  Filled 2016-06-30 (×4): qty 1

## 2016-06-30 MED ORDER — CLONIDINE HCL 0.1 MG PO TABS
0.1000 mg | ORAL_TABLET | Freq: Every day | ORAL | Status: DC
  Filled 2016-06-30 (×2): qty 1

## 2016-06-30 NOTE — [facility] Group Notes (Signed)
[facility] LCSW Group Therapy  06/30/2016  1:05 PM  Type of Therapy:  Group therapy  Participation Level:  Active  Participation Quality:  Attentive  Affect:  Flat  Cognitive:  Oriented  Insight:  Limited  Engagement in Therapy:  Limited  Modes of Intervention:  Discussion, Socialization  Summary of Progress/Problems:  Chaplain was here to lead a group on themes of hope and courage. Invited, chose to not attend. Ida Rogueorth, Rodney B 06/30/2016 12:49 PM
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
## 2016-06-30 NOTE — Tx Team (Signed)
Interdisciplinary Treatment and Diagnostic Plan Update  06/30/2016 Time of Session: 3:09 PM  Keshona Kartes MRN: [id number]  Principal Diagnosis: Bipolar disorder, curr episode depressed, severe, w/psychotic features ([place])  Secondary Diagnoses: Principal Problem:   Bipolar disorder, curr episode depressed, severe, w/psychotic features ([place]) Active Problems:   Moderate benzodiazepine use disorder (HCC)   Opioid use disorder, moderate, dependence (HCC)   Stimulant use disorder   Chronic pain syndrome   History of back surgery   S/P insertion of spinal cord stimulator   Current Medications:  Current Facility-Administered Medications  Medication Dose Route Frequency Provider Last Rate Last Dose  . acetaminophen (TYLENOL) tablet 650 mg  650 mg Oral Q4H PRN Ursula Alert, MD      . alum & mag hydroxide-simeth (MAALOX/MYLANTA) 200-200-20 MG/5ML suspension 30 mL  30 mL Oral PRN Patrecia Pour, NP      . benztropine (COGENTIN) tablet 0.5 mg  0.5 mg Oral QHS Saramma Eappen, MD      . carbamazepine (TEGRETOL XR) 12 hr tablet 100 mg  100 mg Oral BID Ursula Alert, MD      . chlordiazePOXIDE (LIBRIUM) capsule 25 mg  25 mg Oral Q6H PRN Saramma Eappen, MD      . chlordiazePOXIDE (LIBRIUM) capsule 25 mg  25 mg Oral QID Ursula Alert, MD   25 mg at 06/30/16 1212   Followed by  . [START ON 07/01/2016] chlordiazePOXIDE (LIBRIUM) capsule 25 mg  25 mg Oral TID Ursula Alert, MD       Followed by  . [START ON 07/02/2016] chlordiazePOXIDE (LIBRIUM) capsule 25 mg  25 mg Oral BH-qamhs Ursula Alert, MD       Followed by  . [START ON 07/04/2016] chlordiazePOXIDE (LIBRIUM) capsule 25 mg  25 mg Oral Daily Saramma Eappen, MD      . cloNIDine (CATAPRES) tablet 0.1 mg  0.1 mg Oral QID Ursula Alert, MD   0.1 mg at 06/30/16 1216   Followed by  . [START ON 07/02/2016] cloNIDine (CATAPRES) tablet 0.1 mg  0.1 mg Oral BH-qamhs Ursula Alert, MD       Followed by  . [START ON 07/05/2016] cloNIDine (CATAPRES)  tablet 0.1 mg  0.1 mg Oral QAC breakfast Saramma Eappen, MD      . dicyclomine (BENTYL) tablet 20 mg  20 mg Oral Q6H PRN Saramma Eappen, MD      . DULoxetine (CYMBALTA) DR capsule 20 mg  20 mg Oral Daily Ursula Alert, MD   20 mg at 06/30/16 1218  . folic acid (FOLVITE) tablet 1 mg  1 mg Oral Daily Patrecia Pour, NP   1 mg at 06/30/16 7829  . hydrOXYzine (ATARAX/VISTARIL) tablet 25 mg  25 mg Oral Q6H PRN Ursula Alert, MD      . Influenza vac split quadrivalent PF (FLUARIX) injection 0.5 mL  0.5 mL Intramuscular Tomorrow-1000 Rozetta Nunnery, NP      . lidocaine (LIDODERM) 5 % 2 patch  2 patch Transdermal Q24H Ursula Alert, MD   2 patch at 06/30/16 1318  . loperamide (IMODIUM) capsule 2-4 mg  2-4 mg Oral PRN Ursula Alert, MD      . magnesium hydroxide (MILK OF MAGNESIA) suspension 30 mL  30 mL Oral Daily PRN Patrecia Pour, NP      . methocarbamol (ROBAXIN) tablet 500 mg  500 mg Oral Q8H PRN Ursula Alert, MD   500 mg at 06/30/16 1211  . nicotine (NICODERM CQ - dosed in mg/24 hours) patch 21 mg  21 mg Transdermal Daily Patrecia Pour, NP   21 mg at 06/30/16 2924  . OLANZapine (ZYPREXA) tablet 5 mg  5 mg Oral TID PRN Ursula Alert, MD       Or  . OLANZapine (ZYPREXA) injection 5 mg  5 mg Intramuscular TID PRN Ursula Alert, MD      . ondansetron (ZOFRAN-ODT) disintegrating tablet 4 mg  4 mg Oral Q6H PRN Saramma Eappen, MD      . risperiDONE (RISPERDAL) tablet 0.5 mg  0.5 mg Oral QHS Saramma Eappen, MD      . Derrill Memo ON 07/01/2016] thiamine (VITAMIN B-1) tablet 100 mg  100 mg Oral Daily Saramma Eappen, MD      . traZODone (DESYREL) tablet 50 mg  50 mg Oral QHS Ursula Alert, MD        PTA Medications: Prescriptions Prior to Admission  Medication Sig Dispense Refill Last Dose  . ALPRAZolam (XANAX) 1 MG tablet Take 1 mg by mouth 3 (three) times daily as needed for anxiety or sleep.    06/26/2016  . amphetamine-dextroamphetamine (ADDERALL) 20 MG tablet Take 20 mg by mouth daily as needed (to  focus).   0 06/26/2016  . buPROPion (WELLBUTRIN XL) 150 MG 24 hr tablet Take 450 mg by mouth daily.    Past Month at Unknown time  . cetirizine (ZYRTEC) 10 MG tablet Take 10 mg by mouth daily.   06/26/2016  . cyclobenzaprine (FLEXERIL) 10 MG tablet Take 10 mg by mouth 2 (two) times daily as needed for muscle spasms.   1 06/26/2016  . gabapentin (NEURONTIN) 600 MG tablet Take 600 mg by mouth 4 (four) times daily.   Past Month at Unknown time  . lamoTRIgine (LAMICTAL) 200 MG tablet Take 400 mg by mouth daily.   more than a month  . oxyCODONE (ROXICODONE) 15 MG immediate release tablet Take 15 mg by mouth every 6 (six) hours as needed for pain.    06/28/2016  . prazosin (MINIPRESS) 5 MG capsule Take 5 mg by mouth at bedtime.    more than a month    Treatment Modalities: Medication Management, Group therapy, Case management,  1 to 1 session with clinician, Psychoeducation, Recreational therapy.   Physician Treatment Plan for Primary Diagnosis: Bipolar disorder, curr episode depressed, severe, w/psychotic features ([place]) Long Term Goal(s): Improvement in symptoms so as ready for discharge  Short Term Goals: Ability to identify and develop effective coping behaviors will improve   Medication Management: Evaluate patient's response, side effects, and tolerance of medication regimen.  Therapeutic Interventions: 1 to 1 sessions, Unit Group sessions and Medication administration.  Evaluation of Outcomes: Not Met  Physician Treatment Plan for Secondary Diagnosis: Principal Problem:   Bipolar disorder, curr episode depressed, severe, w/psychotic features ([place]) Active Problems:   Moderate benzodiazepine use disorder (HCC)   Opioid use disorder, moderate, dependence (HCC)   Stimulant use disorder   Chronic pain syndrome   History of back surgery   S/P insertion of spinal cord stimulator   Long Term Goal(s): Improvement in symptoms so as ready for discharge  Short Term Goals:Ability to identify  triggers associated with substance abuse/mental health issues will improve  Medication Management: Evaluate patient's response, side effects, and tolerance of medication regimen.  Therapeutic Interventions: 1 to 1 sessions, Unit Group sessions and Medication administration.  Evaluation of Outcomes: Not Met   RN Treatment Plan for Primary Diagnosis: Bipolar disorder, curr episode depressed, severe, w/psychotic features ([place]) Long Term Goal(s): Knowledge of disease and  therapeutic regimen to maintain health will improve  Short Term Goals: Ability to verbalize feelings will improve and Ability to identify and develop effective coping behaviors will improve  Medication Management: RN will administer medications as ordered by provider, will assess and evaluate patient's response and provide education to patient for prescribed medication. RN will report any adverse and/or side effects to prescribing provider.  Therapeutic Interventions: 1 on 1 counseling sessions, Psychoeducation, Medication administration, Evaluate responses to treatment, Monitor vital signs and CBGs as ordered, Perform/monitor CIWA, COWS, AIMS and Fall Risk screenings as ordered, Perform wound care treatments as ordered.  Evaluation of Outcomes: Progressing   LCSW Treatment Plan for Primary Diagnosis: Bipolar disorder, curr episode depressed, severe, w/psychotic features ([place]) Long Term Goal(s): Safe transition to appropriate next level of care at discharge, Engage patient in therapeutic group addressing interpersonal concerns.  Short Term Goals: Engage patient in aftercare planning with referrals and resources  Therapeutic Interventions: Assess for all discharge needs, 1 to 1 time with Social worker, Explore available resources and support systems, Assess for adequacy in community support network, Educate family and significant other(s) on suicide prevention, Complete Psychosocial Assessment, Interpersonal group  therapy.  Evaluation of Outcomes: Met   Progress in Treatment: Attending groups: No Participating in groups: No Taking medication as prescribed: Yes Toleration medication: Yes, no side effects reported at this time Family/Significant other contact made: No Patient understands diagnosis: Yes AEB asking for help with detox Discussing patient identified problems/goals with staff: Yes Medical problems stabilized or resolved: Yes Denies suicidal/homicidal ideation: Yes Issues/concerns per patient self-inventory: None Other: N/A  New problem(s) identified: None identified at this time.   New Short Term/Long Term Goal(s): None identified at this time.   Discharge Plan or Barriers: return home, follow up outpt Reason for Continuation of Hospitalization:  Depression Hallucinations  Medical Issues Medication stabilization  Withdrawal symptoms  Estimated Length of Stay: 3-5 days  Attendees: Patient: 06/30/2016  3:09 PM  Physician: Ursula Alert, MD 06/30/2016  3:09 PM  Nursing: Hoy Register, RN 06/30/2016  3:09 PM  RN Care Manager: Lars Pinks, RN 06/30/2016  3:09 PM  Social Worker: Ripley Fraise 06/30/2016  3:09 PM  Recreational Therapist: Laretta Bolster  06/30/2016  3:09 PM  Other: Norberto Sorenson 06/30/2016  3:09 PM  Other:  06/30/2016  3:09 PM    Scribe for Treatment Team:  Roque Lias 06/30/2016 3:09 PM

## 2016-06-30 NOTE — Progress Notes (Signed)
Adult Psychoeducational Group Note  Date:  06/30/2016 Time:  8:53 PM  Group Topic/Focus:  Wrap-Up Group:   The focus of this group is to help patients review their daily goal of treatment and discuss progress on daily workbooks.   Participation Level:  Active  Participation Quality:  Appropriate  Affect:  Appropriate  Cognitive:  Appropriate  Insight: Appropriate  Engagement in Group:  Engaged  Modes of Intervention:  Discussion  Additional Comments:  The patient expressed she rates today a 8.The patient also said that she did not attend group. Octavio Mannshigpen, [employer] 06/30/2016, 8:53 PM

## 2016-06-30 NOTE — Progress Notes (Signed)
Pt spent most of the day in bed. Had c/o back pain and had order written to be allowed to use spinal cord stimulator PRN with device being kept in med room. Pt continues to endorse anxiety and depression but denies SI/HI or AVH. Contracted for safety.
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
## 2016-06-30 NOTE — Progress Notes (Signed)
D: Pt denies SI/HI/AVH. Pt is pleasant and cooperative. Pt very emotional this evening, pt concerned about her [place] [facility] [place] [place] Pt very worried about everything involved in her life and if her father was to pass will she be let out to see about him and if her dog is going to die.   A: Pt was offered support and encouragement. Pt was given scheduled medications. Pt was encourage to attend groups. Q 15 minute checks were done for safety.   R:Pt attends groups and interacts well with peers and staff. Pt is taking medication. Pt has no complaints.Pt receptive to treatment and safety maintained on unit.

## 2016-06-30 NOTE — Progress Notes (Signed)
Recreation Therapy Notes  Date: 06/30/16 Time: 1000 Location: 500 Hall Dayroom  Group Topic: Wellness  Goal Area(s) Addresses:  Patient will define components of whole wellness. Patient will verbalize benefit of whole wellness.  Intervention: UnitedHealthBeach ball, chairs  Activity: Keep It ContractorGoing Volleyball.  Patients were to pass the beach ball to each other within the circle to beat the record of 650 hits.  LRT would count each hit on the ball.  The ball could bounce off the ground but could not roll to stop.  If the ball came to a stop, the count would start over.  Education: Wellness, Building control surveyorDischarge Planning.   Education Outcome: Acknowledges education/In group clarification offered/Needs additional education.   Clinical Observations/Feedback: Pt did not attend group.   Caroll RancherMarjette Lindsay, LRT/CTRS      Caroll RancherLindsay, Marjette A 06/30/2016 12:17 PM

## 2016-07-01 DIAGNOSIS — F1320 Sedative, hypnotic or anxiolytic dependence, uncomplicated: Secondary | ICD-10-CM

## 2016-07-01 DIAGNOSIS — F17210 Nicotine dependence, cigarettes, uncomplicated: Secondary | ICD-10-CM

## 2016-07-01 LAB — LIPID PANEL
Cholesterol: 200 mg/dL (ref 0–200)
HDL: 47 mg/dL (ref >40)
LDL Cholesterol: 120 mg/dL — ABNORMAL HIGH (ref 0–99)
Total CHOL/HDL Ratio: 4.3 RATIO
Triglycerides: 165 mg/dL — ABNORMAL HIGH (ref <150)
VLDL: 33 mg/dL (ref 0–40)

## 2016-07-01 LAB — URINALYSIS, COMPLETE (UACMP) WITH MICROSCOPIC
Bilirubin Urine: NEGATIVE
Glucose, UA: NEGATIVE mg/dL
Hgb urine dipstick: NEGATIVE
Ketones, ur: 20 mg/dL — AB
Leukocytes, UA: NEGATIVE
Nitrite: NEGATIVE
Protein, ur: NEGATIVE mg/dL
Specific Gravity, Urine: 1.026 (ref 1.005–1.030)
pH: 6 (ref 5.0–8.0)

## 2016-07-01 LAB — TSH: TSH: 1.466 u[IU]/mL (ref 0.350–4.500)

## 2016-07-01 MED ORDER — GABAPENTIN 600 MG PO TABS
300.0000 mg | ORAL_TABLET | Freq: Three times a day (TID) | ORAL | Status: DC
  Administered 2016-07-01 – 2016-07-02 (×2): 300 mg via ORAL
  Filled 2016-07-01 (×2): qty 0.5
  Filled 2016-07-01: qty 1
  Filled 2016-07-01 (×2): qty 0.5
  Filled 2016-07-01: qty 1
  Filled 2016-07-01: qty 0.5

## 2016-07-01 MED ORDER — INFLUENZA VAC SPLIT QUAD 0.5 ML IM SUSY
0.5000 mL | PREFILLED_SYRINGE | INTRAMUSCULAR | Status: AC
  Administered 2016-07-02: 0.5 mL via INTRAMUSCULAR
  Filled 2016-07-01: qty 0.5

## 2016-07-01 MED ORDER — LIDOCAINE 5 % EX PTCH
1.0000 | MEDICATED_PATCH | Freq: Every day | CUTANEOUS | Status: DC
  Administered 2016-07-01 – 2016-07-04 (×4): 1 via TRANSDERMAL
  Filled 2016-07-01 (×6): qty 1

## 2016-07-01 MED ORDER — GABAPENTIN 300 MG PO CAPS
300.0000 mg | ORAL_CAPSULE | ORAL | Status: AC
  Administered 2016-07-01: 300 mg via ORAL
  Filled 2016-07-01 (×2): qty 1

## 2016-07-01 NOTE — Progress Notes (Signed)
D.  Pt pleasant but anxious on approach, complaint of continued chronic back pain.  Pt is allergic to latex and tape other than paper.  Pt did attend evening wrap up group with appropriate participation (see group notes).  Pt observed interacting appropriately with peers on unit.  Pt denies SI/HI/hallucinations at this time.  A.  Support and encouragement offered, medication given as ordered  R.  Pt remains safe on the unit, will continue to monitor.

## 2016-07-01 NOTE — BHH Group Notes (Signed)
BHH Group Notes: (Clinical Social Work)   07/01/2016      Type of Therapy:  Group Therapy   Participation Level:  Did Not Attend despite MHT prompting   Mareida Grossman-Orr, LCSW 07/01/2016, 1:10 PM     

## 2016-07-01 NOTE — Plan of Care (Signed)
Problem: Education: Goal: Will be free of psychotic symptoms Outcome: Progressing Nurse discussed depression/anxiety/coping skills with patient.   

## 2016-07-01 NOTE — Progress Notes (Signed)
[facility] MD Progress Note  07/01/2016 5:32 PM Alicia Logan  MRN:  [id number]   Subjective:  Patient reports " I am having a bad day, my back is still hurting me."  Objective: Syla Devoss is awake, alert and oriented *3, Seen resting in bedroom. Denies suicidal or homicidal ideation. Denies auditory or visual hallucination and does not appear to be responding to internal stimuli. Patient has c/o back pain.Patient reports she is medication compliant without mediation side effects. States her depression 6/10.  Reports fair appetite and resting well. Support, encouragement and reassurance was provided.    Principal Problem: Bipolar disorder, curr episode depressed, severe, w/psychotic features (HCC) Diagnosis:   Patient Active Problem List   Diagnosis Date Noted  . Bipolar disorder, curr episode depressed, severe, w/psychotic features (HCC) [F31.5] 06/30/2016  . 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 Opioid use disorder, moderate, dependence (HCC) [F11.20] 06/30/2016  . 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 Chronic pain syndrome [G89.4] 06/30/2016  . History of back surgery [Z98.890] 06/30/2016  . S/P insertion of spinal cord stimulator [Z98.890] 06/30/2016  . Polysubstance dependence (HCC) [F19.20] 06/29/2016  . Major depressive disorder, recurrent, moderate (HCC) [F33.1] 06/29/2016  . Chronic pain [G89.29] 05/18/2016  . Viral illness [B34.9] 11/26/2011  . Depression [F32.9] 11/26/2011  . Chronic lower back pain [M54.5, G89.29] 11/26/2011   Total Time spent with patient: 30 minutes  Past Psychiatric History:   Past Medical History:  Past Medical History:  Diagnosis Date  . Anxiety   . Bipolar 1 disorder (HCC)   . Chronic back pain   . Depression   . Headache     Past Surgical History:  Procedure Laterality Date  . DENTAL SURGERY    . NASAL SEPTUM SURGERY    . SPINAL CORD STIMULATOR INSERTION N/A 05/18/2016   Procedure: LUMBAR SPINAL CORD  STIMULATOR INSERTION;  Surgeon: Venita Lick, MD;  Location: [facility] OR;  Service: Orthopedics;  Laterality: N/A;   Family History:  Family History  Problem Relation Age of Onset  . Depression Mother   . Hypertension Father   . Heart failure Father   . Stroke Father    Family Psychiatric  History: Social History:  History  Alcohol Use No    Comment: 4-5 beers daily     History  Drug Use  . Types: Marijuana    Comment: stopped    Social History   Social History  . Marital status: Single    Spouse name: N/A  . Number of children: N/A  . Years of education: N/A   Social History Main Topics  . Smoking status: Current Every Day Smoker    Packs/day: 0.50    Years: 20.00    Types: Cigarettes  . Smokeless tobacco: Never Used     Comment: 1/2 pack per day  . Alcohol use No     Comment: 4-5 beers daily  . Drug use:     Types: Marijuana     Comment: stopped  . Sexual activity: Yes    Birth control/ protection: Condom   Other Topics Concern  . None   Social History Narrative  . None   Additional Social History:                         Sleep: Fair  Appetite:  Poor  Current Medications: Current Facility-Administered Medications  Medication Dose Route Frequency Provider Last Rate Last Dose  . acetaminophen (TYLENOL) tablet 650 mg  650 mg Oral Q4H PRN Jomarie Longs, MD   650 mg at 07/01/16 1211  . alum & mag hydroxide-simeth (MAALOX/MYLANTA) 200-200-20 MG/5ML suspension 30 mL  30 mL Oral PRN Charm Rings, NP      . benztropine (COGENTIN) tablet 0.5 mg  0.5 mg Oral QHS Jomarie Longs, MD   0.5 mg at 06/30/16 2110  . carbamazepine (TEGRETOL XR) 12 hr tablet 100 mg  100 mg Oral BID Jomarie Longs, MD   100 mg at 07/01/16 1728  . chlordiazePOXIDE (LIBRIUM) capsule 25 mg  25 mg Oral Q6H PRN Jomarie Longs, MD      . chlordiazePOXIDE (LIBRIUM) capsule 25 mg  25 mg Oral TID Jomarie Longs, MD   25 mg at 07/01/16 1730   Followed by  . [START ON 07/02/2016]  chlordiazePOXIDE (LIBRIUM) capsule 25 mg  25 mg Oral BH-qamhs Jomarie Longs, MD       Followed by  . [START ON 07/04/2016] chlordiazePOXIDE (LIBRIUM) capsule 25 mg  25 mg Oral Daily Saramma Eappen, MD      . cloNIDine (CATAPRES) tablet 0.1 mg  0.1 mg Oral QID Jomarie Longs, MD   0.1 mg at 07/01/16 1729   Followed by  . [START ON 07/02/2016] cloNIDine (CATAPRES) tablet 0.1 mg  0.1 mg Oral BH-qamhs Jomarie Longs, MD       Followed by  . [START ON 07/05/2016] cloNIDine (CATAPRES) tablet 0.1 mg  0.1 mg Oral QAC breakfast Saramma Eappen, MD      . dicyclomine (BENTYL) tablet 20 mg  20 mg Oral Q6H PRN Jomarie Longs, MD   20 mg at 07/01/16 1210  . DULoxetine (CYMBALTA) DR capsule 20 mg  20 mg Oral Daily Jomarie Longs, MD   20 mg at 07/01/16 1156  . folic acid (FOLVITE) tablet 1 mg  1 mg Oral Daily Charm Rings, NP   1 mg at 07/01/16 1157  . gabapentin (NEURONTIN) tablet 300 mg  300 mg Oral TID Oneta Rack, NP   300 mg at 07/01/16 1728  . hydrOXYzine (ATARAX/VISTARIL) tablet 25 mg  25 mg Oral Q6H PRN Jomarie Longs, MD      . Influenza vac split quadrivalent PF (FLUARIX) injection 0.5 mL  0.5 mL Intramuscular Tomorrow-1000 Jackelyn Poling, NP      . lidocaine (LIDODERM) 5 % 1 patch  1 patch Transdermal Daily Oneta Rack, NP   1 patch at 07/01/16 1158  . loperamide (IMODIUM) capsule 2-4 mg  2-4 mg Oral PRN Jomarie Longs, MD      . magnesium hydroxide (MILK OF MAGNESIA) suspension 30 mL  30 mL Oral Daily PRN Charm Rings, NP      . methocarbamol (ROBAXIN) tablet 500 mg  500 mg Oral Q8H PRN Jomarie Longs, MD   500 mg at 07/01/16 1210  . nicotine (NICODERM CQ - dosed in mg/24 hours) patch 21 mg  21 mg Transdermal Daily Charm Rings, NP   21 mg at 06/30/16 0807  . OLANZapine (ZYPREXA) tablet 5 mg  5 mg Oral TID PRN Jomarie Longs, MD   5 mg at 07/01/16 1500   Or  . OLANZapine (ZYPREXA) injection 5 mg  5 mg Intramuscular TID PRN Jomarie Longs, MD      . ondansetron (ZOFRAN-ODT)  disintegrating tablet 4 mg  4 mg Oral Q6H PRN Jomarie Longs, MD   4 mg at 07/01/16 1500  . risperiDONE (RISPERDAL) tablet 0.5 mg  0.5 mg Oral QHS Jomarie Longs, MD  0.5 mg at 06/30/16 2110  . thiamine (VITAMIN B-1) tablet 100 mg  100 mg Oral Daily Saramma Eappen, MD   100 mg at 07/01/16 1159  . traZODone (DESYREL) tablet 50 mg  50 mg Oral QHS Jomarie LongsSaramma Eappen, MD   50 mg at 06/30/16 2110    Lab Results:  Results for orders placed or performed during the hospital encounter of 06/29/16 (from the past 48 hour(s))  TSH     Status: None   Collection Time: 07/01/16  6:19 AM  Result Value Ref Range   TSH 1.466 0.350 - 4.500 uIU/mL    Comment: Performed by a 3rd Generation assay with a functional sensitivity of <=0.01 uIU/mL. Performed at [facility]   Lipid panel     Status: Abnormal   Collection Time: 07/01/16  6:19 AM  Result Value Ref Range   Cholesterol 200 0 - 200 mg/dL   Triglycerides 960165 (H) <150 mg/dL   HDL 47 >45>40 mg/dL   Total CHOL/HDL Ratio 4.3 RATIO   VLDL 33 0 - 40 mg/dL   LDL Cholesterol 409120 (H) 0 - 99 mg/dL    Comment:        Total Cholesterol/HDL:CHD Risk Coronary Heart Disease Risk Table                     Men   Women  1/2 Average Risk   3.4   3.3  Average Risk       5.0   4.4  2 X Average Risk   9.6   7.1  3 X Average Risk  23.4   11.0        Use the calculated Patient Ratio above and the CHD Risk Table to determine the patient's CHD Risk.        ATP III CLASSIFICATION (LDL):  <100     mg/dL   Optimal  [id number]  mg/dL   Near or Above                    Optimal  130-159  mg/dL   Borderline  [id number]  mg/dL   High  >213>190     mg/dL   Very High [facility]     Blood Alcohol level:  Lab Results  Component Value Date   [facility]5 06/28/2016   ETH <11 12/02/2012    Metabolic Disorder Labs: No results found for: HGBA1C, MPG No results found for: PROLACTIN Lab Results  Component Value Date   CHOL 200 07/01/2016   TRIG  165 (H) 07/01/2016   HDL 47 07/01/2016   CHOLHDL 4.3 07/01/2016   VLDL 33 07/01/2016   LDLCALC 120 (H) 07/01/2016    Physical Findings: AIMS: Facial and Oral Movements Muscles of Facial Expression: None, normal Lips and Perioral Area: None, normal Jaw: None, normal Tongue: None, normal,Extremity Movements Upper (arms, wrists, hands, fingers): None, normal Lower (legs, knees, ankles, toes): None, normal, Trunk Movements Neck, shoulders, hips: None, normal, Overall Severity Severity of abnormal movements (highest score from questions above): None, normal Incapacitation due to abnormal movements: None, normal Patient's awareness of abnormal movements (rate only patient's report): No Awareness, Dental Status Current problems with teeth and/or dentures?: No Does patient usually wear dentures?: No  CIWA:  CIWA-Ar Total: 1 COWS:  COWS Total Score: 3  Musculoskeletal: Strength & Muscle Tone: within normal limits Gait & Station: normal Patient leans: N/A  Psychiatric Specialty Exam: Physical Exam  Vitals reviewed. Constitutional: She  is oriented to person, place, and time. She appears well-developed.  Cardiovascular: Normal rate.   Neurological: She is alert and oriented to person, place, and time.  Psychiatric: She has a normal mood and affect. Her behavior is normal.    Review of Systems  Psychiatric/Behavioral: Positive for depression. The patient is nervous/anxious.     Blood pressure 111/70, pulse 68, temperature 98.7 F (37.1 C), temperature source Oral, resp. rate 16, height 5\' 8"  (1.727 m), weight 74.8 kg (165 lb).Body mass index is 25.09 kg/m.  General Appearance: Casual  Eye Contact:  Minimal  Speech:  Clear and Coherent  Volume:  Normal  Mood:  Depressed  Affect:  Depressed and Flat  Thought Process:  Coherent  Orientation:  Full (Time, Place, and Person)  Thought Content:  Hallucinations: None  Suicidal Thoughts:  No  Homicidal Thoughts:  No  Memory:   Immediate;   Fair Recent;   Fair Remote;   Fair  Judgement:  Fair  Insight:  Lacking  Psychomotor Activity:  Normal  Concentration:  Concentration: Fair  Recall:  [employer]Fair  Fund of Knowledge:  Fair  Language:  Good  Akathisia:  No  Handed:  Right  AIMS (if indicated):     Assets:  Desire for Improvement Resilience Social Support  ADL's:  Intact  Cognition:  WNL  Sleep:  Number of Hours: 5.75    I agree with current treatment plan on 07/01/2016, Patient seen face-to-face for psychiatric evaluation follow-up, chart reviewed. Reviewed the information documented and agree with the treatment plan.  Treatment Plan Summary: Daily contact with patient to assess and evaluate symptoms and progress in treatment and Medication management  Estimated length of stay is 5-7 days.  Reviewed past medical records,treatment plan.  For Mood sx: Continue Tegretol XR 100 mg po bid. For psychosis: Will start Risperidone 0.5 mg po qhs. Will start Cogentin 0.5 mg po qhs. For opioid use disorder: Will continue Clonidine protocol. Started Neurontin 300 mg PO BID for mood/pain stabilization  Provided substance abuse counseling. For BZD use disorder: Start Librium/CIWA protocol.  For insomnia: Start Trazodone 50 mg po qhs.  For chronic pain: Start lidocaine patch. Tylenol 650 mg po prn daily as scheduled. Continue spinal stimulator.  Will continue to monitor vitals ,medication compliance and treatment side effects while patient is here.  Will monitor for medical issues as well as call consult as needed.  Reviewed labs cbc - wnl,cmp - wnl, uds- pos for opioid, BZD, stimulants , pregnancy test - negative ,will get tsh, lipid panel, hba1c, pl. UA- abnormal - will repeat UA, Urine clx. Will order EKG for qtc monitoring. Reviewed CT scan brain - wnl. CSW will start working on disposition.  Patient to participate in therapeutic milieu .   Oneta Rackanika N Lewis, NP 07/01/2016, 5:32 PM  I agree to the  notes and plan

## 2016-07-01 NOTE — Progress Notes (Signed)
[facility] Group Notes:  (Nursing/MHT/Case Management/Adjunct)  Date:  07/01/2016  Time:  9:58 PM  Type of Therapy:  Psychoeducational Skills  Participation Level:  Active  Participation Quality:  Attentive  Affect:  Appropriate  Cognitive:  Appropriate  Insight:  Appropriate  Engagement in Group:  Developing/Improving  Modes of Intervention:  Education  Summary of Progress/Problems: Patient states that she had a better day today since she spent less time in bed. In addition, she mentioned that she went to the gym for recreation. As a theme for the day, her support system will be made up of her parents and sister.   Hazle CocaGOODMAN, BENJAMIN S 07/01/2016, 9:58 PM

## 2016-07-01 NOTE — Progress Notes (Signed)
Patient stated she felt nauseated this morning.  Patient given zofran and ginger ale to drink.  Patient assisted to her bed to rest.  Will give morning meds after nausea subsides.

## 2016-07-01 NOTE — Progress Notes (Signed)
D:  Patient denied SI and HI while talking to nurse this morning.  Patient denied A/V hallucinations. A:  Medications administered per MD orders.  Emotional support and encouragement given patient. R:  Safety maintained with 15 minute checks.

## 2016-07-01 NOTE — [facility] Group Notes (Signed)
The focus of this group is to educate the patient on the purpose and policies of crisis stabilization and provide a format to answer questions about their admission.  The group details unit policies and expectations of patients while admitted.  Patient did not attend 0900 nurse education orientation group this morning.  Patient sleeping.  

## 2016-07-02 LAB — HEMOGLOBIN A1C
Hgb A1c MFr Bld: 5.2 % (ref 4.8–5.6)
Mean Plasma Glucose: 103 mg/dL

## 2016-07-02 MED ORDER — GABAPENTIN 300 MG PO CAPS
300.0000 mg | ORAL_CAPSULE | Freq: Three times a day (TID) | ORAL | Status: DC
  Administered 2016-07-02 – 2016-07-04 (×7): 300 mg via ORAL
  Filled 2016-07-02 (×12): qty 1

## 2016-07-02 MED ORDER — NICOTINE POLACRILEX 2 MG MT GUM
2.0000 mg | CHEWING_GUM | OROMUCOSAL | Status: DC | PRN
  Administered 2016-07-02 – 2016-07-03 (×2): 2 mg via ORAL

## 2016-07-02 NOTE — [facility] Group Notes (Signed)
The focus of this group is to educate the patient on the purpose and policies of crisis stabilization and provide a format to answer questions about their admission.  The group details unit policies and expectations of patients while admitted.  Patient did not attend 0900 nurse education orientation group this morning.  Patient stayed in room.  

## 2016-07-02 NOTE — [facility] Group Notes (Signed)
[facility] Group Notes: (Clinical Social Work)   07/02/2016      Type of Therapy:  Group Therapy   Participation Level:  Did Not Attend despite MHT prompting - she did come into the room for the last song.   Ambrose MantleMareida Grossman-Orr, LCSW 07/02/2016, 5:48 PM

## 2016-07-02 NOTE — Progress Notes (Signed)
[facility] MD Progress Note  07/02/2016 11:30 AM Alicia Logan  MRN:  [id number]   Subjective:  Patient reports " I am feeling a lot better today." C/o mild generalized pain. States pain is mostly in her lower back.  Objective: Alicia Logan is awake, alert and oriented *3, Seen sitting at nursing station interacting with staff. Denies suicidal or homicidal ideation. Denies auditory or visual hallucination and does not appear to be responding to internal stimuli. Patient reports the first 2 days of detox was the worst, states she is feeling so much better than before.  Patient still endorses back pain.Patient reports she is medication compliant without mediation side effects. States her depression 6/10.  Reports fair appetite and reports resting well with the new medication combination on last night. Support, encouragement and reassurance was provided.    Principal Problem: Bipolar disorder, curr episode depressed, severe, w/psychotic features (HCC) Diagnosis:   Patient Active Problem List   Diagnosis Date Noted  . Bipolar disorder, curr episode depressed, severe, w/psychotic features (HCC) [F31.5] 06/30/2016  . 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 Opioid use disorder, moderate, dependence (HCC) [F11.20] 06/30/2016  . 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 Chronic pain syndrome [G89.4] 06/30/2016  . History of back surgery [Z98.890] 06/30/2016  . S/P insertion of spinal cord stimulator [Z98.890] 06/30/2016  . Polysubstance dependence (HCC) [F19.20] 06/29/2016  . Major depressive disorder, recurrent, moderate (HCC) [F33.1] 06/29/2016  . Chronic pain [G89.29] 05/18/2016  . Viral illness [B34.9] 11/26/2011  . Depression [F32.9] 11/26/2011  . Chronic lower back pain [M54.5, G89.29] 11/26/2011   Total Time spent with patient: 30 minutes  Past Psychiatric History:   Past Medical History:  Past Medical History:  Diagnosis Date  . Anxiety   . Bipolar 1  disorder (HCC)   . Chronic back pain   . Depression   . Headache     Past Surgical History:  Procedure Laterality Date  . DENTAL SURGERY    . NASAL SEPTUM SURGERY    . SPINAL CORD STIMULATOR INSERTION N/A 05/18/2016   Procedure: LUMBAR SPINAL CORD STIMULATOR INSERTION;  Surgeon: Venita Lick, MD;  Location: [facility] OR;  Service: Orthopedics;  Laterality: N/A;   Family History:  Family History  Problem Relation Age of Onset  . Depression Mother   . Hypertension Father   . Heart failure Father   . Stroke Father    Family Psychiatric  History: Social History:  History  Alcohol Use No    Comment: 4-5 beers daily     History  Drug Use  . Types: Marijuana    Comment: stopped    Social History   Social History  . Marital status: Single    Spouse name: N/A  . Number of children: N/A  . Years of education: N/A   Social History Main Topics  . Smoking status: Current Every Day Smoker    Packs/day: 0.50    Years: 20.00    Types: Cigarettes  . Smokeless tobacco: Never Used     Comment: 1/2 pack per day  . Alcohol use No     Comment: 4-5 beers daily  . Drug use:     Types: Marijuana     Comment: stopped  . Sexual activity: Yes    Birth control/ protection: Condom   Other Topics Concern  . None   Social History Narrative  . None   Additional Social History:  Sleep: Fair  Appetite:  Poor  Current Medications: Current Facility-Administered Medications  Medication Dose Route Frequency Provider Last Rate Last Dose  . acetaminophen (TYLENOL) tablet 650 mg  650 mg Oral Q4H PRN Jomarie LongsSaramma Eappen, MD   650 mg at 07/01/16 2156  . alum & mag hydroxide-simeth (MAALOX/MYLANTA) 200-200-20 MG/5ML suspension 30 mL  30 mL Oral PRN Charm RingsJamison Y Lord, NP      . benztropine (COGENTIN) tablet 0.5 mg  0.5 mg Oral QHS Jomarie LongsSaramma Eappen, MD   0.5 mg at 07/01/16 2155  . carbamazepine (TEGRETOL XR) 12 hr tablet 100 mg  100 mg Oral BID Jomarie LongsSaramma Eappen, MD   100 mg at  07/02/16 0802  . chlordiazePOXIDE (LIBRIUM) capsule 25 mg  25 mg Oral Q6H PRN Jomarie LongsSaramma Eappen, MD   25 mg at 07/01/16 2156  . chlordiazePOXIDE (LIBRIUM) capsule 25 mg  25 mg Oral BH-qamhs Jomarie LongsSaramma Eappen, MD       Followed by  . [START ON 07/04/2016] chlordiazePOXIDE (LIBRIUM) capsule 25 mg  25 mg Oral Daily Saramma Eappen, MD      . cloNIDine (CATAPRES) tablet 0.1 mg  0.1 mg Oral QID Jomarie LongsSaramma Eappen, MD   0.1 mg at 07/02/16 0802   Followed by  . cloNIDine (CATAPRES) tablet 0.1 mg  0.1 mg Oral BH-qamhs Jomarie LongsSaramma Eappen, MD       Followed by  . [START ON 07/05/2016] cloNIDine (CATAPRES) tablet 0.1 mg  0.1 mg Oral QAC breakfast Saramma Eappen, MD      . dicyclomine (BENTYL) tablet 20 mg  20 mg Oral Q6H PRN Jomarie LongsSaramma Eappen, MD   20 mg at 07/01/16 1210  . DULoxetine (CYMBALTA) DR capsule 20 mg  20 mg Oral Daily Jomarie LongsSaramma Eappen, MD   20 mg at 07/02/16 0803  . folic acid (FOLVITE) tablet 1 mg  1 mg Oral Daily Charm RingsJamison Y Lord, NP   1 mg at 07/02/16 0800  . gabapentin (NEURONTIN) capsule 300 mg  300 mg Oral TID Jomarie LongsSaramma Eappen, MD      . hydrOXYzine (ATARAX/VISTARIL) tablet 25 mg  25 mg Oral Q6H PRN Jomarie LongsSaramma Eappen, MD   25 mg at 07/01/16 2155  . Influenza vac split quadrivalent PF (FLUARIX) injection 0.5 mL  0.5 mL Intramuscular Tomorrow-1000 Saramma Eappen, MD      . lidocaine (LIDODERM) 5 % 1 patch  1 patch Transdermal Daily Oneta Rackanika N Lewis, NP   1 patch at 07/02/16 0804  . loperamide (IMODIUM) capsule 2-4 mg  2-4 mg Oral PRN Jomarie LongsSaramma Eappen, MD      . magnesium hydroxide (MILK OF MAGNESIA) suspension 30 mL  30 mL Oral Daily PRN Charm RingsJamison Y Lord, NP      . methocarbamol (ROBAXIN) tablet 500 mg  500 mg Oral Q8H PRN Jomarie LongsSaramma Eappen, MD   500 mg at 07/01/16 2155  . nicotine (NICODERM CQ - dosed in mg/24 hours) patch 21 mg  21 mg Transdermal Daily Charm RingsJamison Y Lord, NP   21 mg at 07/02/16 [id number]  . OLANZapine (ZYPREXA) tablet 5 mg  5 mg Oral TID PRN Jomarie LongsSaramma Eappen, MD   5 mg at 07/01/16 1500   Or  . OLANZapine (ZYPREXA)  injection 5 mg  5 mg Intramuscular TID PRN Jomarie LongsSaramma Eappen, MD      . ondansetron (ZOFRAN-ODT) disintegrating tablet 4 mg  4 mg Oral Q6H PRN Jomarie LongsSaramma Eappen, MD   4 mg at 07/01/16 2156  . risperiDONE (RISPERDAL) tablet 0.5 mg  0.5 mg Oral QHS Jomarie LongsSaramma Eappen, MD  0.5 mg at 07/01/16 2155  . thiamine (VITAMIN B-1) tablet 100 mg  100 mg Oral Daily Jomarie Longs, MD   100 mg at 07/02/16 0804  . traZODone (DESYREL) tablet 50 mg  50 mg Oral QHS Jomarie Longs, MD   50 mg at 07/01/16 2155    Lab Results:  Results for orders placed or performed during the hospital encounter of 06/29/16 (from the past 48 hour(s))  TSH     Status: None   Collection Time: 07/01/16  6:19 AM  Result Value Ref Range   TSH 1.466 0.350 - 4.500 uIU/mL    Comment: Performed by a 3rd Generation assay with a functional sensitivity of <=0.01 uIU/mL. Performed at [facility]   Lipid panel     Status: Abnormal   Collection Time: 07/01/16  6:19 AM  Result Value Ref Range   Cholesterol 200 0 - 200 mg/dL   Triglycerides 161 (H) <150 mg/dL   HDL 47 >09 mg/dL   Total CHOL/HDL Ratio 4.3 RATIO   VLDL 33 0 - 40 mg/dL   LDL Cholesterol 604 (H) 0 - 99 mg/dL    Comment:        Total Cholesterol/HDL:CHD Risk Coronary Heart Disease Risk Table                     Men   Women  1/2 Average Risk   3.4   3.3  Average Risk       5.0   4.4  2 X Average Risk   9.6   7.1  3 X Average Risk  23.4   11.0        Use the calculated Patient Ratio above and the CHD Risk Table to determine the patient's CHD Risk.        ATP III CLASSIFICATION (LDL):  <100     mg/dL   Optimal  540-981  mg/dL   Near or Above                    Optimal  130-159  mg/dL   Borderline  191-478  mg/dL   High  >295     mg/dL   Very High Performed at [facility]   Hemoglobin A1c     Status: None   Collection Time: 07/01/16  6:19 AM  Result Value Ref Range   Hgb A1c MFr Bld 5.2 4.8 - 5.6 %    Comment: (NOTE)         Pre-diabetes: 5.7 -  6.4         Diabetes: >6.4         Glycemic control for adults with diabetes: <7.0    Mean Plasma Glucose 103 mg/dL    Comment: (NOTE) Performed At: [facility] [address] [place], [place] [id number] Mila Homer MD QI:[id number] Performed at [facility]   Urinalysis, Complete w Microscopic     Status: Abnormal   Collection Time: 07/01/16  1:15 PM  Result Value Ref Range   Color, Urine YELLOW YELLOW   APPearance CLEAR CLEAR   Specific Gravity, Urine 1.026 1.005 - 1.030   pH 6.0 5.0 - 8.0   Glucose, UA NEGATIVE NEGATIVE mg/dL   Hgb urine dipstick NEGATIVE NEGATIVE   Bilirubin Urine NEGATIVE NEGATIVE   Ketones, ur 20 (A) NEGATIVE mg/dL   Protein, ur NEGATIVE NEGATIVE mg/dL   Nitrite NEGATIVE NEGATIVE   Leukocytes, UA NEGATIVE NEGATIVE   RBC / HPF  0-5 0 - 5 RBC/hpf   WBC, UA 0-5 0 - 5 WBC/hpf   Bacteria, UA RARE (A) NONE SEEN   Squamous Epithelial / LPF 0-5 (A) NONE SEEN   Mucous PRESENT     Comment: [facility]    Blood Alcohol level:  Lab Results  Component Value Date   [facility]   ETH <11 12/02/2012    Metabolic Disorder Labs: Lab Results  Component Value Date   HGBA1C 5.2 07/01/2016   MPG 103 07/01/2016   No results found for: PROLACTIN Lab Results  Component Value Date   CHOL 200 07/01/2016   TRIG 165 (H) 07/01/2016   HDL 47 07/01/2016   CHOLHDL 4.3 07/01/2016   VLDL 33 07/01/2016   LDLCALC 120 (H) 07/01/2016    Physical Findings: AIMS: Facial and Oral Movements Muscles of Facial Expression: None, normal Lips and Perioral Area: None, normal Jaw: None, normal Tongue: None, normal,Extremity Movements Upper (arms, wrists, hands, fingers): None, normal Lower (legs, knees, ankles, toes): None, normal, Trunk Movements Neck, shoulders, hips: None, normal, Overall Severity Severity of abnormal movements (highest score from questions above): None, normal Incapacitation due to  abnormal movements: None, normal Patient's awareness of abnormal movements (rate only patient's report): No Awareness, Dental Status Current problems with teeth and/or dentures?: No Does patient usually wear dentures?: No  CIWA:  CIWA-Ar Total: 0 COWS:  COWS Total Score: 1  Musculoskeletal: Strength & Muscle Tone: within normal limits Gait & Station: normal Patient leans: N/A  Psychiatric Specialty Exam: Physical Exam  Vitals reviewed. Constitutional: She is oriented to person, place, and time. She appears well-developed and well-nourished.  Cardiovascular: Normal rate.   Neurological: She is alert and oriented to person, place, and time.  Psychiatric: She has a normal mood and affect. Her behavior is normal.    Review of Systems  Psychiatric/Behavioral: Positive for depression and substance abuse. Negative for suicidal ideas. The patient is nervous/anxious.     Blood pressure 96/63, pulse 98, temperature 98.1 F (36.7 C), temperature source Oral, resp. rate 16, height 5\' 8"  (1.727 m), weight 74.8 kg (165 lb).Body mass index is 25.09 kg/m.  General Appearance: Casual  Eye Contact:  Minimal  Speech:  Clear and Coherent  Volume:  Normal  Mood:  Depressed  Affect:  Depressed and Flat  Thought Process:  Coherent  Orientation:  Full (Time, Place, and Person)  Thought Content:  Hallucinations: None  Suicidal Thoughts:  No  Homicidal Thoughts:  No  Memory:  Immediate;   Fair Recent;   Fair Remote;   Fair  Judgement:  Fair  Insight:  Lacking  Psychomotor Activity:  Normal  Concentration:  Concentration: Fair  Recall:  FiservFair  Fund of Knowledge:  Fair  Language:  Good  Akathisia:  No  Handed:  Right  AIMS (if indicated):     Assets:  Desire for Improvement Resilience Social Support  ADL's:  Intact  Cognition:  WNL  Sleep:  Number of Hours: 6.5    I agree with current treatment plan on 07/02/2016, Patient seen face-to-face for psychiatric evaluation follow-up, chart  reviewed. Reviewed the information documented and agree with the treatment plan.  Treatment Plan Summary: Daily contact with patient to assess and evaluate symptoms and progress in treatment and Medication management  Estimated length of stay is 5-7 days.  Reviewed past medical records,treatment plan.  For Mood sx: Continue Tegretol XR 100 mg po bid. For psychosis: Will start Risperidone 0.5 mg po  qhs. Will start Cogentin 0.5 mg po qhs. For opioid use disorder: Will continue Clonidine protocol. Continue Neurontin 300 mg PO BID for mood/pain stabilization  Provided substance abuse counseling. For BZD use disorder: Start Librium/CIWA protocol.  For insomnia: Start Trazodone 50 mg po qhs.  For chronic pain: Start lidocaine patch. Tylenol 650 mg po prn daily as scheduled. Continue spinal stimulator.  Will continue to monitor vitals ,medication compliance and treatment side effects while patient is here.  Will monitor for medical issues as well as call consult as needed.  Reviewed labs cbc - wnl,cmp - wnl, uds- pos for opioid, BZD, stimulants , pregnancy test - negative ,will get tsh, lipid panel, hba1c, pl. UA- abnormal - will repeat UA, Urine clx. Will order EKG for qtc monitoring. Reviewed CT scan brain - wnl. CSW will start working on disposition.  Patient to participate in therapeutic milieu .   Oneta Rack, NP 07/02/2016, 11:30 AM  Agree with notes and plan

## 2016-07-02 NOTE — Progress Notes (Signed)
D:  Patient's self inventory sheet, patient sleeps good, sleep medication is helpful.  Poor appetite, low energy level, good concentration.  Rated depression, hopeless and anxiety #5.  Withdrawals, tremors, diarrhea, chilling, cramping, nausea, runny nose, irritability.  Denied SI.  Physical problems, pain, lower back, worst pain in past 24 hours is #8.  Pain medications helpful "somewhat".  Goal is to attend group at least once or twice.  No discharge plans. A:  Medications administered per MD orders.  Emotional support and encouragement given patient. R:  Patient denied SI and HI, contracts for safety.  Denied A/V hallucinations.  Safety maintained with 15 minute checks.

## 2016-07-02 NOTE — Plan of Care (Signed)
Problem: Education: Goal: Utilization of techniques to improve thought processes will improve Outcome: Progressing Nurse discussed depression/anxiety/coping skills with patient.    

## 2016-07-02 NOTE — Progress Notes (Signed)
Writer spoke with [name] 1:1 and she reported receiving new about her fathers illness and not knowing how much longer he has to live. She attended group tonight and was compliant with her scheduled medications. She made reference to the police moving her furniture around at her home. Support given and safety maintained on unit with 15 min checks.

## 2016-07-02 NOTE — Progress Notes (Signed)
[facility] Group Notes:  (Nursing/MHT/Case Management/Adjunct)  Date:  07/02/2016  Time:  9:43 PM  Type of Therapy:  Psychoeducational Skills  Participation Level:  Active  Participation Quality:  Appropriate  Affect:  Appropriate  Cognitive:  Appropriate  Insight:  Good  Engagement in Group:  Engaged  Modes of Intervention:  Education  Summary of Progress/Problems: The patient verbalized in group that she more time away from her room than yesterday. She stated that she was disappointed about not being able to go to the gym. In addition, she stated that she never spoke with the doctor and did not address a personal issue. As a theme for the day, her coping skill will be to practice yoga.   Hazle CocaGOODMAN, BENJAMIN S 07/02/2016, 9:43 PM

## 2016-07-02 NOTE — Progress Notes (Signed)
Patient stated she needs cortisone cream for fungal infection on scalp/neck, stated her dermatologist prescribes for her.

## 2016-07-03 LAB — URINE CULTURE: Culture: NO GROWTH

## 2016-07-03 LAB — PROLACTIN: Prolactin: 33.8 ng/mL — ABNORMAL HIGH (ref 4.8–23.3)

## 2016-07-03 NOTE — Plan of Care (Signed)
Problem: Health Behavior/Discharge Planning: Goal: Compliance with prescribed medication regimen will improve Outcome: Progressing Patient was compliant with scheduled medications.

## 2016-07-03 NOTE — Progress Notes (Addendum)
[facility] MD Progress Note  07/03/2016 2:43 PM Alicia Logan  MRN:  [id number]   Subjective:  Patient reports " I am still suicidal."    Objective: Alicia Logan is seen as alert , oriented. Pt seen and chart reviewed, discussed with treatment team. Pt today seen as more visible in milieu, which is much different from her presentation on admission day. Pt reports she continues to be suicidal , denies any plan. Pt does report paranoia - but states it has improved. Pt has been tolerating her medications well, states she wants to think about referral to a substance abuse program, will discuss with family and let Clinical research associate /CSW know.   Principal Problem: Bipolar disorder, curr episode depressed, severe, w/psychotic features (HCC) Diagnosis:   Patient Active Problem List   Diagnosis Date Noted  . Bipolar disorder, curr episode depressed, severe, w/psychotic features (HCC) [F31.5] 06/30/2016  . 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 Opioid use disorder, moderate, dependence (HCC) [F11.20] 06/30/2016  . 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 Chronic pain syndrome [G89.4] 06/30/2016  . History of back surgery [Z98.890] 06/30/2016  . S/P insertion of spinal cord stimulator [Z98.890] 06/30/2016  . Polysubstance dependence (HCC) [F19.20] 06/29/2016  . Major depressive disorder, recurrent, moderate (HCC) [F33.1] 06/29/2016  . Chronic pain [G89.29] 05/18/2016  . Viral illness [B34.9] 11/26/2011  . Depression [F32.9] 11/26/2011  . Chronic lower back pain [M54.5, G89.29] 11/26/2011   Total Time spent with patient: 25 minutes  Past Psychiatric History: Please see H&P.   Past Medical History:  Past Medical History:  Diagnosis Date  . Anxiety   . Bipolar 1 disorder (HCC)   . Chronic back pain   . Depression   . Headache     Past Surgical History:  Procedure Laterality Date  . DENTAL SURGERY    . NASAL SEPTUM SURGERY    . SPINAL CORD STIMULATOR INSERTION  N/A 05/18/2016   Procedure: LUMBAR SPINAL CORD STIMULATOR INSERTION;  Surgeon: Venita Lick, MD;  Location: [facility] OR;  Service: Orthopedics;  Laterality: N/A;   Family History:  Family History  Problem Relation Age of Onset  . Depression Mother   . Hypertension Father   . Heart failure Father   . Stroke Father    Family Psychiatric  History: Please see H&P.  Social History:  History  Alcohol Use No    Comment: 4-5 beers daily     History  Drug Use  . Types: Marijuana    Comment: stopped    Social History   Social History  . Marital status: Single    Spouse name: N/A  . Number of children: N/A  . Years of education: N/A   Social History Main Topics  . Smoking status: Current Every Day Smoker    Packs/day: 0.50    Years: 20.00    Types: Cigarettes  . Smokeless tobacco: Never Used     Comment: 1/2 pack per day  . Alcohol use No     Comment: 4-5 beers daily  . Drug use:     Types: Marijuana     Comment: stopped  . Sexual activity: Yes    Birth control/ protection: Condom   Other Topics Concern  . None   Social History Narrative  . None   Additional Social History:                         Sleep: Fair  Appetite:  Fair  Current Medications:  Current Facility-Administered Medications  Medication Dose Route Frequency Provider Last Rate Last Dose  . acetaminophen (TYLENOL) tablet 650 mg  650 mg Oral Q4H PRN Jomarie LongsSaramma Eappen, MD   650 mg at 07/03/16 0447  . alum & mag hydroxide-simeth (MAALOX/MYLANTA) 200-200-20 MG/5ML suspension 30 mL  30 mL Oral PRN Charm RingsJamison Y Lord, NP      . benztropine (COGENTIN) tablet 0.5 mg  0.5 mg Oral QHS Jomarie LongsSaramma Eappen, MD   0.5 mg at 07/02/16 2117  . carbamazepine (TEGRETOL XR) 12 hr tablet 100 mg  100 mg Oral BID Jomarie LongsSaramma Eappen, MD   100 mg at 07/03/16 0735  . [START ON 07/04/2016] chlordiazePOXIDE (LIBRIUM) capsule 25 mg  25 mg Oral Daily Saramma Eappen, MD      . cloNIDine (CATAPRES) tablet 0.1 mg  0.1 mg Oral BH-qamhs Saramma  Eappen, MD   0.1 mg at 07/02/16 2119   Followed by  . [START ON 07/05/2016] cloNIDine (CATAPRES) tablet 0.1 mg  0.1 mg Oral QAC breakfast Saramma Eappen, MD      . dicyclomine (BENTYL) tablet 20 mg  20 mg Oral Q6H PRN Jomarie LongsSaramma Eappen, MD   20 mg at 07/02/16 1147  . DULoxetine (CYMBALTA) DR capsule 20 mg  20 mg Oral Daily Jomarie LongsSaramma Eappen, MD   20 mg at 07/03/16 0737  . folic acid (FOLVITE) tablet 1 mg  1 mg Oral Daily Charm RingsJamison Y Lord, NP   1 mg at 07/03/16 0737  . gabapentin (NEURONTIN) capsule 300 mg  300 mg Oral TID Jomarie LongsSaramma Eappen, MD   300 mg at 07/03/16 1206  . lidocaine (LIDODERM) 5 % 1 patch  1 patch Transdermal Daily Oneta Rackanika N Lewis, NP   1 patch at 07/03/16 [phone]  . magnesium hydroxide (MILK OF MAGNESIA) suspension 30 mL  30 mL Oral Daily PRN Charm RingsJamison Y Lord, NP      . methocarbamol (ROBAXIN) tablet 500 mg  500 mg Oral Q8H PRN Jomarie LongsSaramma Eappen, MD   500 mg at 07/02/16 1147  . nicotine polacrilex (NICORETTE) gum 2 mg  2 mg Oral PRN Jomarie LongsSaramma Eappen, MD   2 mg at 07/03/16 0741  . OLANZapine (ZYPREXA) tablet 5 mg  5 mg Oral TID PRN Jomarie LongsSaramma Eappen, MD   5 mg at 07/03/16 1021   Or  . OLANZapine (ZYPREXA) injection 5 mg  5 mg Intramuscular TID PRN Jomarie LongsSaramma Eappen, MD      . risperiDONE (RISPERDAL) tablet 0.5 mg  0.5 mg Oral QHS Jomarie LongsSaramma Eappen, MD   0.5 mg at 07/02/16 2117  . thiamine (VITAMIN B-1) tablet 100 mg  100 mg Oral Daily Jomarie LongsSaramma Eappen, MD   100 mg at 07/03/16 0739  . traZODone (DESYREL) tablet 50 mg  50 mg Oral QHS Jomarie LongsSaramma Eappen, MD   50 mg at 07/02/16 2117    Lab Results:  No results found for this or any previous visit (from the past 48 hour(s)).  Blood Alcohol level:  Lab Results  Component Value Date   [facility]   ETH <11 12/02/2012    Metabolic Disorder Labs: Lab Results  Component Value Date   HGBA1C 5.2 07/01/2016   MPG 103 07/01/2016   Lab Results  Component Value Date   PROLACTIN 33.8 (H) 07/01/2016   Lab Results  Component Value Date   CHOL 200 07/01/2016    TRIG 165 (H) 07/01/2016   HDL 47 07/01/2016   CHOLHDL 4.3 07/01/2016   VLDL 33 07/01/2016   LDLCALC 120 (H) 07/01/2016  Physical Findings: AIMS: Facial and Oral Movements Muscles of Facial Expression: None, normal Lips and Perioral Area: None, normal Jaw: None, normal Tongue: None, normal,Extremity Movements Upper (arms, wrists, hands, fingers): None, normal Lower (legs, knees, ankles, toes): None, normal, Trunk Movements Neck, shoulders, hips: None, normal, Overall Severity Severity of abnormal movements (highest score from questions above): None, normal Incapacitation due to abnormal movements: None, normal Patient's awareness of abnormal movements (rate only patient's report): No Awareness, Dental Status Current problems with teeth and/or dentures?: No Does patient usually wear dentures?: No  CIWA:  CIWA-Ar Total: 1 COWS:  COWS Total Score: 2  Musculoskeletal: Strength & Muscle Tone: within normal limits Gait & Station: normal Patient leans: N/A  Psychiatric Specialty Exam: Physical Exam  Nursing note and vitals reviewed. Psychiatric: Her behavior is normal.    Review of Systems  Psychiatric/Behavioral: Positive for depression, substance abuse and suicidal ideas. The patient is nervous/anxious.   All other systems reviewed and are negative.   Blood pressure 120/70, pulse 69, temperature 97.8 F (36.6 C), temperature source Oral, resp. rate 16, height 5\' 8"  (1.727 m), weight 74.8 kg (165 lb).Body mass index is 25.09 kg/m.  General Appearance: Casual  Eye Contact:  Fair  Speech:  Clear and Coherent  Volume:  Normal  Mood:  Anxious and Depressed  Affect:  Congruent  Thought Process:  Coherent and Descriptions of Associations: Circumstantial  Orientation:  Full (Time, Place, and Person)  Thought Content:  Paranoid Ideation and Rumination  Suicidal Thoughts:  Yes.  without intent/plan  Homicidal Thoughts:  No  Memory:  Immediate;   Fair Recent;   Fair Remote;    Fair  Judgement:  Fair  Insight:  Lacking  Psychomotor Activity:  Restlessness  Concentration:  Concentration: Fair and Attention Span: Fair  Recall:  FiservFair  Fund of Knowledge:  Fair  Language:  Good  Akathisia:  No  Handed:  Right  AIMS (if indicated):     Assets:  Desire for Improvement Social Support  ADL's:  Intact  Cognition:  WNL  Sleep:  Number of Hours: 5.25     Treatment Plan Summary:Patient seen as anxious , suicidal, paranoia continues to improve. Pt is thinking about possibly considering substance rehab program- will let writer/CSW know.     Bipolar disorder, curr episode depressed, severe, w/psychotic features (HCC) improving  Will continue today 07/03/16 plan as below except where it is noted.    Daily contact with patient to assess and evaluate symptoms and progress in treatment and Medication management   Reviewed past medical records,treatment plan.   For Mood sx: Continue Tegretol XR 100 mg po bid. Will order Tegretol level tomorrow.  For psychosis: Will continue Risperidone 0.5 mg po qhs. Will continue Cogentin 0.5 mg po qhs.  For opioid use disorder: Will continue Clonidine protocol.  For anxiety sx: Continue Neurontin 300 mg PO TID.  For BZD use disorder: Provided substance abuse counseling. Librium/CIWA protocol. Referral to substance abuse program if motivated.  For insomnia: Trazodone 50 mg po qhs.  For chronic pain:  lidocaine patch. Tylenol 650 mg po prn daily as scheduled. Continue spinal stimulator.  Will continue to monitor vitals ,medication compliance and treatment side effects while patient is here.   Will monitor for medical issues as well as call consult as needed.   Reviewed labs UA- wnl , Urine clx- no growth, PL - elevated - will need to be monitored.  Pending EKG for qtc monitoring.  Reviewed CT scan brain - wnl.  CSW will continue working on disposition.   Patient to participate in therapeutic  milieu .   Eappen,Saramma, MD 07/03/2016, 2:43 PM

## 2016-07-03 NOTE — Progress Notes (Signed)
D:  Patient's self inventory sheet, patient has fair sleep, sleep medication is helpful.  Poor appetite, low energy level, good concentration.  Rated depression 3, hopeless 5, anxiety 8.  Withdrawals.  Tremors, chilling, cravings, agitation, feel very agitated.  Denied SI.  Physical problems, lightheaded, pain, headaches, lower back pain.  Physical pain, lower back, chronic pain, pain medication is helpful.  Goal is to fill out disability review, due 07/07/2016.  Plans to talk to group and talk to SW and MD.  No discharge plans. A:  Medications administered per MD orders.  Emotional support and encouragement given patient. R:  Denied SI and HI, contracts for safety.  Denied A/V hallucinations.  Safety maintained with 15 minute checks.

## 2016-07-03 NOTE — Progress Notes (Signed)
Recreation Therapy Notes  INPATIENT RECREATION THERAPY ASSESSMENT  Patient Details Name: Alicia SportsmanKathryn Nesheim MRN: [id number] DOB: 1970/09/25 Today's Date: 07/03/2016  Patient Stressors: Family, Other (Comment) (Dog is old; disability)  Pt stated she was here for not taking her medication and depression.  Coping Skills:   Isolate, Substance Abuse, Avoidance, Exercise, Art/Dance, Talking  Personal Challenges: Anger, Communication, Concentration, Decision-Making, Problem-Solving, Self-Esteem/Confidence, Stress Management, Substance Abuse, Time Management, Trusting Others  Leisure Interests (2+):  Individual - Other (Comment) (Pet dog; yoga)  Awareness of Community Resources:  No  Patient Strengths:  Speak up for self; not scared  Patient Identified Areas of Improvement:  Anger, anxiety  Current Recreation Participation:  "Not as much since back surgery, waiting for release from doctor"  Patient Goal for Hospitalization:  "Figure out natural remedies for back pain"  [place]  [place]   Current [place] (including self-harm):  No  Current HI:  No  Consent to Intern Participation: N/A   Caroll RancherMarjette Lindsay, LRT/CTRS  Caroll RancherLindsay, Marjette A 07/03/2016, 2:13 PM

## 2016-07-03 NOTE — [facility] Group Notes (Signed)
[facility] LCSW Group Therapy  07/03/2016 1:15 pm  Type of Therapy: Process Group Therapy  Participation Level:  Active  Participation Quality:  Appropriate  Affect:  Flat  Cognitive:  Oriented  Insight:  Improving  Engagement in Group:  Limited  Engagement in Therapy:  Limited  Modes of Intervention:  Activity, Clarification, Education, Problem-solving and Support  Summary of Progress/Problems: Today's group addressed the issue of overcoming obstacles.  Patients were asked to identify their biggest obstacle post d/c that stands in the way of their on-going success, and then problem solve as to how to manage this. Stayed the entire time, slept for most of it.  Started out angry at family.  "First they say I need to get help, then they say I need to come home, but then they won't give me a ride to where I need to go."  Was overwhelmed by this thought which made her angry, and shut down.  Daryel Geraldorth, Rodney B 07/03/2016   3:45 PM

## 2016-07-03 NOTE — Progress Notes (Signed)
Recreation Therapy Notes  Date: 07/03/16 Time: 1000 Location: 500 Hall Dayroom  Group Topic: Coping Skills  Goal Area(s) Addresses:  Pt will be able to positive coping skills. Pt will be able to identify the importance of using positive coping skills post d/c.  Behavioral Response:  Engaged, Tearful  Intervention: Scientist, clinical (histocompatibility and immunogenetics)Construction paper, markers  Activity: Bridge the McK[employer].  Patients were to draw to connecting mountains on a Education officer, museumpiece of construction paper.  On the first hump, patients were to identify what brought them to the hospital.  On the second hump, patients were to identify what they hope to accomplish.  Next, patients were to draw a line connecting the two hump.  On that line, patients were to identify the coping skills that would help them reach their goal.   Education: Coping Skills, Discharge Planning.   Education Outcome: Acknowledges understanding/In group clarification offered/Needs additional education.   Clinical Observations/Feedback:  Pt stated she was here because of paranoia.  Pt also stated she needed coping skills.  Pt became tearful with talking about her dad and her dog.  Pt stated her dad was in the hospital and terminally ill.  Pt also expressed that her dog is older and was afraid they would pass at the same time.  Pt did state she needs counseling to help her deal with her feelings.     Caroll RancherMarjette Lindsay, LRT/CTRS        Lillia AbedLindsay, Marjette A 07/03/2016 1:13 PM

## 2016-07-03 NOTE — Plan of Care (Signed)
Problem: Education: Goal: Knowledge of the prescribed therapeutic regimen will improve Outcome: Progressing Nurse discussed depression/coping skills with patient.        

## 2016-07-03 NOTE — Progress Notes (Signed)
Adult Psychoeducational Group Note  Date:  07/03/2016 Time:  9:31 PM  Group Topic/Focus:  Wrap-Up Group:   The focus of this group is to help patients review their daily goal of treatment and discuss progress on daily workbooks.   Participation Level:  Active  Participation Quality:  Appropriate  Affect:  Appropriate  Cognitive:  Alert  Insight: Appropriate  Engagement in Group:  Engaged  Modes of Intervention:  Discussion  Additional Comments:  Patient states, "I had a very bad day". Patient's goal for today was to make it to group. Patient states she made it to two groups.  Taffney L Deas 07/03/2016, 9:31 PM

## 2016-07-04 LAB — CARBAMAZEPINE LEVEL, TOTAL: Carbamazepine Lvl: 3.1 ug/mL — ABNORMAL LOW (ref 4.0–12.0)

## 2016-07-04 MED ORDER — TRAZODONE HCL 50 MG PO TABS: 50.0000 mg | ORAL_TABLET | Freq: Every day | ORAL | 0 refills | Status: DC

## 2016-07-04 MED ORDER — RISPERIDONE 0.5 MG PO TABS: 0.5000 mg | ORAL_TABLET | Freq: Every day | ORAL | 0 refills | Status: DC

## 2016-07-04 MED ORDER — GABAPENTIN 300 MG PO CAPS: 300.0000 mg | ORAL_CAPSULE | Freq: Three times a day (TID) | ORAL | 0 refills | Status: AC

## 2016-07-04 MED ORDER — DULOXETINE HCL 20 MG PO CPEP: 20.0000 mg | ORAL_CAPSULE | Freq: Every day | ORAL | 0 refills | Status: DC

## 2016-07-04 MED ORDER — CARBAMAZEPINE ER 100 MG PO TB12: 100.0000 mg | ORAL_TABLET | Freq: Two times a day (BID) | ORAL | 0 refills | Status: DC

## 2016-07-04 MED ORDER — BENZTROPINE MESYLATE 0.5 MG PO TABS: 0.5000 mg | ORAL_TABLET | Freq: Every day | ORAL | 0 refills | Status: DC

## 2016-07-04 MED ORDER — NICOTINE POLACRILEX 2 MG MT GUM: 2.0000 mg | CHEWING_GUM | OROMUCOSAL | 0 refills | Status: DC | PRN

## 2016-07-04 NOTE — Progress Notes (Signed)
  [facility] :  Will you be returning to the same living situation after discharge:  Yes,  home in [place] At discharge, do you have transportation home?: Yes,  friend Do you have the ability to pay for your medications: Yes,  MCR  Release of information consent forms completed and in the chart;  Patient's signature needed at discharge.  Patient to Follow up at: Follow-up Information    Glori BickersKAUR,RUPINDER DHAMI, MD Follow up on 07/06/2016.   Specialty:  Psychiatry Why:  Thursday at 9:45AM Contact information: [address] [address] [place] [address] [phone]           Next level of care provider has access to [facility] Link:no  Safety Planning and Suicide Prevention discussed: Yes,  yes  Have you used any form of tobacco in the last 30 days? (Cigarettes, Smokeless Tobacco, Cigars, and/or Pipes): No  Has patient been referred to the Quitline?: N/A patient is not a smoker  Patient has been referred for addiction treatment: Pt. refused referral  Alicia RogueRodney B Logan 07/04/2016, 10:28 AM

## 2016-07-04 NOTE — Tx Team (Signed)
Interdisciplinary Treatment and Diagnostic Plan Update  07/04/2016 Time of Session: 10:31 AM  Alicia Logan MRN: [id number]  Principal Diagnosis: Bipolar disorder, curr episode depressed, severe, w/psychotic features ([place])  Secondary Diagnoses: Principal Problem:   Bipolar disorder, curr episode depressed, severe, w/psychotic features ([place]) Active Problems:   Moderate benzodiazepine use disorder (HCC)   Opioid use disorder, moderate, dependence (HCC)   Stimulant use disorder   Chronic pain syndrome   History of back surgery   S/P insertion of spinal cord stimulator   Current Medications:  Current Facility-Administered Medications  Medication Dose Route Frequency Provider Last Rate Last Dose  . acetaminophen (TYLENOL) tablet 650 mg  650 mg Oral Q4H PRN Ursula Alert, MD   650 mg at 07/04/16 0815  . alum & mag hydroxide-simeth (MAALOX/MYLANTA) 200-200-20 MG/5ML suspension 30 mL  30 mL Oral PRN Patrecia Pour, NP      . benztropine (COGENTIN) tablet 0.5 mg  0.5 mg Oral QHS Ursula Alert, MD   0.5 mg at 07/03/16 2226  . carbamazepine (TEGRETOL XR) 12 hr tablet 100 mg  100 mg Oral BID Ursula Alert, MD   100 mg at 07/04/16 0811  . cloNIDine (CATAPRES) tablet 0.1 mg  0.1 mg Oral BH-qamhs Saramma Eappen, MD   0.1 mg at 07/04/16 0814   Followed by  . [START ON 07/05/2016] cloNIDine (CATAPRES) tablet 0.1 mg  0.1 mg Oral QAC breakfast Saramma Eappen, MD      . dicyclomine (BENTYL) tablet 20 mg  20 mg Oral Q6H PRN Ursula Alert, MD   20 mg at 07/02/16 1147  . DULoxetine (CYMBALTA) DR capsule 20 mg  20 mg Oral Daily Ursula Alert, MD   20 mg at 07/04/16 0811  . folic acid (FOLVITE) tablet 1 mg  1 mg Oral Daily Patrecia Pour, NP   1 mg at 07/04/16 9562  . gabapentin (NEURONTIN) capsule 300 mg  300 mg Oral TID Ursula Alert, MD   300 mg at 07/04/16 0812  . lidocaine (LIDODERM) 5 % 1 patch  1 patch Transdermal Daily Derrill Center, NP   1 patch at 07/04/16 [phone]  . magnesium hydroxide (MILK  OF MAGNESIA) suspension 30 mL  30 mL Oral Daily PRN Patrecia Pour, NP      . methocarbamol (ROBAXIN) tablet 500 mg  500 mg Oral Q8H PRN Ursula Alert, MD   500 mg at 07/04/16 0603  . nicotine polacrilex (NICORETTE) gum 2 mg  2 mg Oral PRN Ursula Alert, MD   2 mg at 07/03/16 0741  . OLANZapine (ZYPREXA) tablet 5 mg  5 mg Oral TID PRN Ursula Alert, MD   5 mg at 07/04/16 0019   Or  . OLANZapine (ZYPREXA) injection 5 mg  5 mg Intramuscular TID PRN Ursula Alert, MD      . risperiDONE (RISPERDAL) tablet 0.5 mg  0.5 mg Oral QHS Saramma Eappen, MD   0.5 mg at 07/03/16 2226  . thiamine (VITAMIN B-1) tablet 100 mg  100 mg Oral Daily Ursula Alert, MD   100 mg at 07/04/16 6578  . traZODone (DESYREL) tablet 50 mg  50 mg Oral QHS Ursula Alert, MD   50 mg at 07/03/16 2226    PTA Medications: Prescriptions Prior to Admission  Medication Sig Dispense Refill Last Dose  . ALPRAZolam (XANAX) 1 MG tablet Take 1 mg by mouth 3 (three) times daily as needed for anxiety or sleep.    06/26/2016  . amphetamine-dextroamphetamine (ADDERALL) 20 MG tablet Take 20  mg by mouth daily as needed (to focus).   0 06/26/2016  . buPROPion (WELLBUTRIN XL) 150 MG 24 hr tablet Take 450 mg by mouth daily.    Past Month at Unknown time  . cetirizine (ZYRTEC) 10 MG tablet Take 10 mg by mouth daily.   06/26/2016  . cyclobenzaprine (FLEXERIL) 10 MG tablet Take 10 mg by mouth 2 (two) times daily as needed for muscle spasms.   1 06/26/2016  . gabapentin (NEURONTIN) 600 MG tablet Take 600 mg by mouth 4 (four) times daily.   Past Month at Unknown time  . lamoTRIgine (LAMICTAL) 200 MG tablet Take 400 mg by mouth daily.   more than a month  . oxyCODONE (ROXICODONE) 15 MG immediate release tablet Take 15 mg by mouth every 6 (six) hours as needed for pain.    06/28/2016  . prazosin (MINIPRESS) 5 MG capsule Take 5 mg by mouth at bedtime.    more than a month    Treatment Modalities: Medication Management, Group therapy, Case management,  1  to 1 session with clinician, Psychoeducation, Recreational therapy.   Physician Treatment Plan for Primary Diagnosis: Bipolar disorder, curr episode depressed, severe, w/psychotic features ([place]) Long Term Goal(s): Improvement in symptoms so as ready for discharge  Short Term Goals: Ability to identify and develop effective coping behaviors will improve   Medication Management: Evaluate patient's response, side effects, and tolerance of medication regimen.  Therapeutic Interventions: 1 to 1 sessions, Unit Group sessions and Medication administration.  Evaluation of Outcomes: Adequate for Discharge  Physician Treatment Plan for Secondary Diagnosis: Principal Problem:   Bipolar disorder, curr episode depressed, severe, w/psychotic features ([place]) Active Problems:   Moderate benzodiazepine use disorder (HCC)   Opioid use disorder, moderate, dependence (HCC)   Stimulant use disorder   Chronic pain syndrome   History of back surgery   S/P insertion of spinal cord stimulator   Long Term Goal(s): Improvement in symptoms so as ready for discharge  Short Term Goals:Ability to identify triggers associated with substance abuse/mental health issues will improve  Medication Management: Evaluate patient's response, side effects, and tolerance of medication regimen.  Therapeutic Interventions: 1 to 1 sessions, Unit Group sessions and Medication administration.  Evaluation of Outcomes: Adequate for Discharge   RN Treatment Plan for Primary Diagnosis: Bipolar disorder, curr episode depressed, severe, w/psychotic features ([place]) Long Term Goal(s): Knowledge of disease and therapeutic regimen to maintain health will improve  Short Term Goals: Ability to verbalize feelings will improve and Ability to identify and develop effective coping behaviors will improve  Medication Management: RN will administer medications as ordered by provider, will assess and evaluate patient's response and provide  education to patient for prescribed medication. RN will report any adverse and/or side effects to prescribing provider.  Therapeutic Interventions: 1 on 1 counseling sessions, Psychoeducation, Medication administration, Evaluate responses to treatment, Monitor vital signs and CBGs as ordered, Perform/monitor CIWA, COWS, AIMS and Fall Risk screenings as ordered, Perform wound care treatments as ordered.  Evaluation of Outcomes: Adequate for Discharge   Recreational Therapy Treatment Plan for Primary Diagnosis: Bipolar disorder, curr episode depressed, severe, w/psychotic features ([place]) Long Term Goal(s):  LTG- Patient will participate in recreation therapy tx in at least 2 group sessions without prompting from LRT.   Short Term Goals:  Patient will be able to identify at least 5 coping skills for admitting dx by conclusion of recreation therapy tx.   Treatment Modalities: Group and Pet Therapy  Therapeutic Interventions: Psychoeducation  Evaluation  of Outcomes: Progressing   LCSW Treatment Plan for Primary Diagnosis: Bipolar disorder, curr episode depressed, severe, w/psychotic features ([place]) Long Term Goal(s): Safe transition to appropriate next level of care at discharge, Engage patient in therapeutic group addressing interpersonal concerns.  Short Term Goals: Engage patient in aftercare planning with referrals and resources  Therapeutic Interventions: Assess for all discharge needs, 1 to 1 time with Social worker, Explore available resources and support systems, Assess for adequacy in community support network, Educate family and significant other(s) on suicide prevention, Complete Psychosocial Assessment, Interpersonal group therapy.  Evaluation of Outcomes: Met   Progress in Treatment: Attending groups: No Participating in groups: No Taking medication as prescribed: Yes Toleration medication: Yes, no side effects reported at this time Family/Significant other contact made:  No Patient understands diagnosis: Yes AEB asking for help with detox Discussing patient identified problems/goals with staff: Yes Medical problems stabilized or resolved: Yes Denies suicidal/homicidal ideation: Yes Issues/concerns per patient self-inventory: None Other: N/A  New problem(s) identified: None identified at this time.   New Short Term/Long Term Goal(s): None identified at this time.   Discharge Plan or Barriers: return home, follow up outpt Reason for Continuation of Hospitalization:   Estimated Length of Stay: D/C today  Attendees: Patient: 07/04/2016  10:31 AM  Physician: Ursula Alert, MD 07/04/2016  10:31 AM  Nursing: Hoy Register, RN 07/04/2016  10:31 AM  RN Care Manager: Lars Pinks, RN 07/04/2016  10:31 AM  Social Worker: Ripley Fraise 07/04/2016  10:31 AM  Recreational Therapist: Laretta Bolster  07/04/2016  10:31 AM  Other: Norberto Sorenson 07/04/2016  10:31 AM  Other:  07/04/2016  10:31 AM    Scribe for Treatment Team:  Roque Lias LCSW 07/04/2016 10:31 AM

## 2016-07-04 NOTE — Progress Notes (Signed)
  D: When asked the circumstances surrounding her adm pt stated, "substance abuse and depression". However, after a few minutes more of talking pt stated, "I was paranoid, that's why I'm here".  Pt has no other questions or concerns.    A:  Support and encouragement was offered. 15 min checks continued for safety.  R: Pt remains safe.

## 2016-07-04 NOTE — [facility] Suicide Risk Assessment (Signed)
[facility]   Principal Problem: Bipolar disorder, curr episode depressed, severe, w/psychotic features [facility](HCC) Discharge Diagnoses:  Patient Active Problem List   Diagnosis Date Noted  . Bipolar disorder, curr episode depressed, severe, w/psychotic features (HCC) [F31.5] 06/30/2016  . 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 Opioid use disorder, moderate, dependence (HCC) [F11.20] 06/30/2016  . 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 Chronic pain syndrome [G89.4] 06/30/2016  . History of back surgery [Z98.890] 06/30/2016  . S/P insertion of spinal cord stimulator [Z98.890] 06/30/2016  . Polysubstance dependence (HCC) [F19.20] 06/29/2016  . Major depressive disorder, recurrent, moderate (HCC) [F33.1] 06/29/2016  . Chronic pain [G89.29] 05/18/2016  . Viral illness [B34.9] 11/26/2011  . Depression [F32.9] 11/26/2011  . Chronic lower back pain [M54.5, G89.29] 11/26/2011    Total Time spent with patient: 30 minutes  Musculoskeletal: Strength & Muscle Tone: within normal limits Gait & Station: normal Patient leans: N/A  Psychiatric Specialty Exam: Review of Systems  Psychiatric/Behavioral: Positive for substance abuse. Negative for depression, hallucinations and suicidal ideas. The patient is not nervous/anxious.   All other systems reviewed and are negative.   Blood pressure 119/73, pulse 72, temperature 97.8 F (36.6 C), temperature source Oral, resp. rate 16, height 5\' 8"  (1.727 m), weight 74.8 kg (165 lb).Body mass index is 25.09 kg/m.  General Appearance: Casual  Eye Contact::  Fair  Speech:  Clear and Coherent409  Volume:  Normal  Mood:  Euthymic  Affect:  Congruent  Thought Process:  Goal Directed and Descriptions of Associations: Intact  Orientation:  Full (Time, Place, and Person)  Thought Content:  Logical  Suicidal Thoughts:  No  Homicidal Thoughts:  No  Memory:  Immediate;   Fair Recent;   Fair Remote;    Fair  Judgement:  Fair  Insight:  Fair  Psychomotor Activity:  Normal  Concentration:  Fair  Recall:  FiservFair  Fund of Knowledge:Fair  Language: Fair  Akathisia:  No  Handed:  Right  AIMS (if indicated):     Assets:  Desire for Improvement  Sleep:  Number of Hours: 1.5  Cognition: WNL  ADL's:  Intact   Mental Status Per Nursing Assessment::   On Admission:  Self-harm thoughts  Demographic Factors:  Caucasian and Low socioeconomic status  Loss Factors: NA  Historical Factors: Impulsivity  Risk Reduction Factors:   Positive therapeutic relationship  Continued Clinical Symptoms:  Alcohol/Substance Abuse/Dependencies Chronic Pain Previous Psychiatric Diagnoses and Treatments  Cognitive Features That Contribute To Risk:  None    Suicide Risk:  Minimal: No identifiable suicidal ideation.  Patients presenting with no risk factors but with morbid ruminations; may be classified as minimal risk based on the severity of the depressive symptoms  Follow-up Information    Glori BickersKAUR,RUPINDER DHAMI, MD Follow up.   Specialty:  Psychiatry Contact information: [address] [address] [place] [address] [phone]           Plan Of Care/Follow-up recommendations: Patient reported misusing opioids , Benzodiazepines and her UDS was positive for stimulants. Pt presented with severe paranoia and anxiety , withdrawal sx. Pt was tapered off her BZD as well as opioids - will recommend that patient not be restarted on any BZD as well as opioids on an out patient basis due to her substance abuse issues.  Activity:  no restrictions Diet:  regular Tests:  tegretol level as needed Other:  none  Eappen,Saramma, MD 07/04/2016, 9:43 AM

## 2016-07-04 NOTE — [facility] Suicide Risk Assessment (Signed)
[facility] INPATIENT:  Family/Significant Other Suicide Prevention Education  Suicide Prevention Education:  Education Completed; No one has been identified by the patient as the family member/significant other with whom the patient will be residing, and identified as the person(s) who will aid the patient in the event of a mental health crisis (suicidal ideations/suicide attempt).  With written consent from the patient, the family member/significant other has been provided the following suicide prevention education, prior to the and/or following the discharge of the patient.  The suicide prevention education provided includes the following:  Suicide risk factors  Suicide prevention and interventions  National Suicide Hotline telephone number  [facility] assessment telephone number  [facility] Emergency Assistance 911  [facility] [facility] and/or Residential Mobile Crisis Unit telephone number  Request made of family/significant other to:  Remove weapons (e.g., guns, rifles, knives), all items previously/currently identified as safety concern.    Remove drugs/medications (over-the-counter, prescriptions, illicit drugs), all items previously/currently identified as a safety concern.  The family member/significant other verbalizes understanding of the suicide prevention education information provided.  The family member/significant other agrees to remove the items of safety concern listed above. The patient did not endorse SI at the time of admission, nor did the patient c/o SI during the stay here.  SPE not required.  Alicia Logan 07/04/2016, 10:31 AM

## 2016-07-04 NOTE — Plan of Care (Signed)
Problem: [facility] Participation in Recreation Therapeutic Interventions Goal: STG-Patient will identify at least five coping skills for ** STG: Coping Skills - Patient will be able to identify at least 5 coping skills for depression by conclusion of recreation therapy tx  Outcome: Completed/Met Date Met: 07/04/16 Pt was able to identify coping skills at the conclusion of coping skill recreation therapy session.  Victorino Sparrow, LRT/CTRS

## 2016-07-04 NOTE — Progress Notes (Signed)
Recreation Therapy Notes  Date: 07/04/16 Time: 1000 Location: 500 Hall Dayroom  Group Topic: Self-Esteem  Goal Area(s) Addresses:  Patient will identify positive ways to increase self-esteem. Patient will verbalize benefit of increased self-esteem.  Behavioral Response: Engaged  Intervention: Blank mask worksheet, colored pencils  Activity: How I See Me.  Patients were given a sheet with a blank mask on it.  Patients were asked to write on the back of the sheet how they feel other people view them.  On the blank mask, patients were asked to design how they view themselves.  Patients could use pictures and words to express their thoughts.  Education:  Self-Esteem, Building control surveyorDischarge Planning.   Education Outcome: Acknowledges education/In group clarification offered/Needs additional education  Clinical Observations/Feedback: Pt explained self-esteem as "how others think of you and they say bad things about you and you start to think it'.  Pt worked well on her sheet.  Pt left group right before processing.   Caroll RancherMarjette Lindsay, LRT/CTRS         Caroll RancherLindsay, Marjette A 07/04/2016 12:15 PM

## 2016-07-04 NOTE — Discharge Summary (Signed)
Physician Discharge Summary Note  Patient:  Alicia SportsmanKathryn Logan is an 45 y.o., female MRN:  [id number] DOB:  Feb 23, 1971 Patient phone:  [phone] (home)  Patient address:   [address]  [place] [address],  Total Time spent with patient: 30 minutes  Date of Admission:  06/29/2016 Date of Discharge: 07/04/2016  Reason for Admission:  psychosis  Principal Problem: Bipolar disorder, curr episode depressed, severe, w/psychotic features [facility](HCC) Discharge Diagnoses: Patient Active Problem List   Diagnosis Date Noted  . Bipolar disorder, curr episode depressed, severe, w/psychotic features (HCC) [F31.5] 06/30/2016  . 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 Opioid use disorder, moderate, dependence (HCC) [F11.20] 06/30/2016  . 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 Chronic pain syndrome [G89.4] 06/30/2016  . History of back surgery [Z98.890] 06/30/2016  . S/P insertion of spinal cord stimulator [Z98.890] 06/30/2016  . Polysubstance dependence (HCC) [F19.20] 06/29/2016  . Major depressive disorder, recurrent, moderate (HCC) [F33.1] 06/29/2016  . Chronic pain [G89.29] 05/18/2016  . Viral illness [B34.9] 11/26/2011  . Depression [F32.9] 11/26/2011  . Chronic lower back pain [M54.5, G89.29] 11/26/2011    Past Psychiatric History:  See HPI Past Medical History:  Past Medical History:  Diagnosis Date  . Anxiety   . Bipolar 1 disorder (HCC)   . Chronic back pain   . Depression   . Headache     Past Surgical History:  Procedure Laterality Date  . DENTAL SURGERY    . NASAL SEPTUM SURGERY    . SPINAL CORD STIMULATOR INSERTION N/A 05/18/2016   Procedure: LUMBAR SPINAL CORD STIMULATOR INSERTION;  Surgeon: Venita Lickahari Brooks, MD;  Location: [facility] OR;  Service: Orthopedics;  Laterality: N/A;   Family History:  Family History  Problem Relation Age of Onset  . Depression Mother   . Hypertension Father   . Heart failure Father   . Stroke Father    Family  Psychiatric  History: see HPI Social History:  History  Alcohol Use No    Comment: 4-5 beers daily     History  Drug Use  . Types: Marijuana    Comment: stopped    Social History   Social History  . Marital status: Single    Spouse name: N/A  . Number of children: N/A  . Years of education: N/A   Social History Main Topics  . Smoking status: Current Every Day Smoker    Packs/day: 0.50    Years: 20.00    Types: Cigarettes  . Smokeless tobacco: Never Used     Comment: 1/2 pack per day  . Alcohol use No     Comment: 4-5 beers daily  . Drug use:     Types: Marijuana     Comment: stopped  . Sexual activity: Yes    Birth control/ protection: Condom   Other Topics Concern  . None   Social History Narrative  . None    Hospital Course:  Alicia GrammesKathryn Parmanis a 45 y.o.caucasian female, has a hx of Bipolar disorder, chronic back pain, S/P surgery and spinal stimulator implantation, also has a hx of opioid, BZD use disorder , [facility] due [facility] psychosis.  [facility] anxiety, was not able to verbalize appropriately.  She was clearly in a disorganized, manic, &paranoid state.  Alicia SportsmanKathryn Logan was admitted for Bipolar disorder, curr episode depressed, severe, w/psychotic features (HCC) and crisis management.  Patient was treated with medications with their indications listed below in detail under  Medication List.  Medical problems were identified and treated as needed.  Home medications were restarted as appropriate.  Improvement was monitored by observation and Alicia Logan daily report of symptom reduction.  Emotional and mental status was monitored by daily self inventory reports completed by Alicia Logan and clinical staff.  Patient reported continued improvement, denied any new concerns.  Patient had been compliant on medications and denied side effects.  Support and encouragement was provided.         Alicia Logan was  evaluated by the treatment team for stability and plans for continued recovery upon discharge.  Patient was offered further treatment options upon discharge including Residential, Intensive Outpatient and Outpatient treatment. Patient will follow up with agency listed below for medication management and counseling.  Encouraged patient to maintain satisfactory support network and home environment.  Advised to adhere to medication compliance and outpatient treatment follow up.  Prescriptions provided.       Alicia Logan motivation was an integral factor for scheduling further treatment.  Employment, transportation, bed availability, health status, family support, and any pending legal issues were also considered during patient's hospital stay.  Upon completion of this admission the patient was both mentally and medically stable for discharge denying suicidal/homicidal ideation, auditory/visual/tactile hallucinations, delusional thoughts and paranoia.      Physical Findings: AIMS: Facial and Oral Movements Muscles of Facial Expression: None, normal Lips and Perioral Area: None, normal Jaw: None, normal Tongue: None, normal,Extremity Movements Upper (arms, wrists, hands, fingers): None, normal Lower (legs, knees, ankles, toes): None, normal, Trunk Movements Neck, shoulders, hips: None, normal, Overall Severity Severity of abnormal movements (highest score from questions above): None, normal Incapacitation due to abnormal movements: None, normal Patient's awareness of abnormal movements (rate only patient's report): No Awareness, Dental Status Current problems with teeth and/or dentures?: No Does patient usually wear dentures?: No  CIWA:  CIWA-Ar Total: 1 COWS:  COWS Total Score: 2  Musculoskeletal: Strength & Muscle Tone: within normal limits Gait & Station: normal Patient leans: N/A  Psychiatric Specialty Exam: Physical Exam  Nursing note and vitals reviewed. Psychiatric: She has a normal  mood and affect. Her behavior is normal. Judgment and thought content normal. She is not agitated, not aggressive, not hyperactive and not combative. Thought content is not paranoid. She expresses no homicidal and no suicidal ideation.    Review of Systems  Constitutional: Negative.   HENT: Negative.   Eyes: Negative.   Respiratory: Negative.   Cardiovascular: Negative.   Gastrointestinal: Negative.   Genitourinary: Negative.   Musculoskeletal: Negative.   Skin: Negative.   Neurological: Negative.   Endo/Heme/Allergies: Negative.   Psychiatric/Behavioral: Negative.  Negative for depression, substance abuse and suicidal ideas.  All other systems reviewed and are negative.   Blood pressure 119/73, pulse 72, temperature 97.8 F (36.6 C), temperature source Oral, resp. rate 16, height 5\' 8"  (1.727 m), weight 74.8 kg (165 lb).Body mass index is 25.09 kg/m.    Have you used any form of tobacco in the last 30 days? (Cigarettes, Smokeless Tobacco, Cigars, and/or Pipes): No  Has this patient used any form of tobacco in the last 30 days? (Cigarettes, Smokeless Tobacco, Cigars, and/or Pipes) Yes, Rx given to patient  Blood Alcohol level:  Lab Results  Component Value Date   [facility] <5 06/28/2016   ETH <11 12/02/2012    Metabolic Disorder Labs:  Lab Results  Component Value Date   HGBA1C 5.2 07/01/2016   MPG 103 07/01/2016   Lab Results  Component Value Date   PROLACTIN 33.8 (H) 07/01/2016   Lab Results  Component Value Date   CHOL 200 07/01/2016   TRIG 165 (H) 07/01/2016   HDL 47 07/01/2016   CHOLHDL 4.3 07/01/2016   VLDL 33 07/01/2016   LDLCALC 120 (H) 07/01/2016    See Psychiatric Specialty Exam and Suicide Risk Assessment completed by Attending Physician prior to discharge.  Discharge destination:  Home  Is patient on multiple antipsychotic therapies at discharge:  No   Has Patient had three or more failed trials of antipsychotic monotherapy by history:  No  Recommended  Plan for Multiple Antipsychotic Therapies: NA     Medication List    STOP taking these medications   ALPRAZolam 1 MG tablet Commonly known as:  XANAX   amphetamine-dextroamphetamine 20 MG tablet Commonly known as:  ADDERALL   buPROPion 150 MG 24 hr tablet Commonly known as:  WELLBUTRIN XL   cetirizine 10 MG tablet Commonly known as:  ZYRTEC   cyclobenzaprine 10 MG tablet Commonly known as:  FLEXERIL   gabapentin 600 MG tablet Commonly known as:  NEURONTIN Replaced by:  gabapentin 300 MG capsule   lamoTRIgine 200 MG tablet Commonly known as:  LAMICTAL   oxyCODONE 15 MG immediate release tablet Commonly known as:  ROXICODONE   prazosin 5 MG capsule Commonly known as:  MINIPRESS     TAKE these medications     Indication  benztropine 0.5 MG tablet Commonly known as:  COGENTIN Take 1 tablet (0.5 mg total) by mouth at bedtime.  Indication:  Extrapyramidal Reaction caused by Medications   carbamazepine 100 MG 12 hr tablet Commonly known as:  TEGRETOL XR Take 1 tablet (100 mg total) by mouth 2 (two) times daily.  Indication:  mood stabilization   DULoxetine 20 MG capsule Commonly known as:  CYMBALTA Take 1 capsule (20 mg total) by mouth daily. Start taking on:  07/05/2016  Indication:  Major Depressive Disorder   gabapentin 300 MG capsule Commonly known as:  NEURONTIN Take 1 capsule (300 mg total) by mouth 3 (three) times daily. Replaces:  gabapentin 600 MG tablet  Indication:  mood stabilization   nicotine polacrilex 2 MG gum Commonly known as:  NICORETTE Take 1 each (2 mg total) by mouth as needed for smoking cessation.  Indication:  Nicotine Addiction   risperiDONE 0.5 MG tablet Commonly known as:  RISPERDAL Take 1 tablet (0.5 mg total) by mouth at bedtime.  Indication:  Psychosis, mood stabilization   traZODone 50 MG tablet Commonly known as:  DESYREL Take 1 tablet (50 mg total) by mouth at bedtime.  Indication:  Major Depressive Disorder       Follow-up Information    Glori BickersKAUR,RUPINDER DHAMI, MD Follow up.   Specialty:  Psychiatry Contact information: [address] [address] [place] [address] [phone]           Follow-up recommendations:  Activity:  as tol Diet:  as tol  Comments:  1.  Take all your medications as prescribed.   2.  Report any adverse side effects to outpatient provider. 3.  Patient instructed to not use alcohol or illegal drugs while on prescription medicines. 4.  In the event of worsening symptoms, instructed patient to call 911, the crisis hotline or go to nearest emergency room for evaluation of symptoms.  Signed: Lindwood QuaSheila May Agustin, NP [facility] 07/04/2016, 10:28 AM

## 2016-08-05 ENCOUNTER — Emergency Department (HOSPITAL_BASED_OUTPATIENT_CLINIC_OR_DEPARTMENT_OTHER)
Admission: EM | Admit: 2016-08-05 | Discharge: 2016-08-05 | Disposition: A | Discharge status: Home / Self Care | Payer: Medicare Other | Attending: Emergency Medicine | Admitting: Emergency Medicine

## 2016-08-05 ENCOUNTER — Encounter (HOSPITAL_BASED_OUTPATIENT_CLINIC_OR_DEPARTMENT_OTHER): Payer: Self-pay | Admitting: Emergency Medicine

## 2016-08-05 DIAGNOSIS — B86 Scabies: Secondary | ICD-10-CM | POA: Diagnosis present

## 2016-08-05 DIAGNOSIS — F17210 Nicotine dependence, cigarettes, uncomplicated: Secondary | ICD-10-CM | POA: Diagnosis not present

## 2016-08-05 DIAGNOSIS — L299 Pruritus, unspecified: Secondary | ICD-10-CM | POA: Insufficient documentation

## 2016-08-05 DIAGNOSIS — L282 Other prurigo: Secondary | ICD-10-CM

## 2016-08-05 DIAGNOSIS — Z79899 Other long term (current) drug therapy: Secondary | ICD-10-CM | POA: Diagnosis not present

## 2016-08-05 MED ORDER — TRIAMCINOLONE ACETONIDE 0.5 % EX OINT: 1.0000 "application " | TOPICAL_OINTMENT | Freq: Two times a day (BID) | CUTANEOUS | 0 refills | Status: DC

## 2016-08-05 NOTE — ED Triage Notes (Signed)
Pt c/o scabies and staph infection of the scalp. [facility] with a pt that had scabies. Reports having antibiotics, cream and shampoo. Scalp with redness and flaking. Small red bumps on arms.

## 2016-08-05 NOTE — ED Notes (Signed)
Pt given d/c instructions as per chart. Verbalizes understanding. No questions. Rx x 1 

## 2016-08-05 NOTE — ED Notes (Signed)
Pt states she did see a dermatologist for a "staph infection" and was treated for same, but she was then told her roommate had scabies- which she has "not been given anything for".

## 2016-08-05 NOTE — ED Provider Notes (Signed)
[facility] Provider Note   CSN: [id number] Arrival date & time: 08/05/16  1610  By signing my name below, I, Alicia Logan, attest that this documentation has been prepared under the direction and in the presence of Alicia Morn, NP-C. Electronically Signed: Orpah Logan , ED Scribe. 08/05/16. 6:20 PM.   History   Chief Complaint Chief Complaint  Patient presents with  . scabies    HPI Alicia Logan is a 46 y.o. female with hx of depression who presents to the Emergency Department complaining of possible scabies infestation with onset x 1 day. Pt states that she was recently admitted into [facility] and was told by that her roommate had scabies. She is concerned that she may have scabies due to the itchiness to her bilateral forearms. Pt also states that she is being treated for a staph infection to the scalp. She has taken Doxycycline, and used shampoo, antifungal ointment with mild relief. Pt reports rash, fever, chills. Of note, pt states that she wants to admit herself into a 28-day program for depression.    The history is provided by the patient. No language interpreter was used.    Past Medical History:  Diagnosis Date  . Anxiety   . Bipolar 1 disorder (HCC)   . Chronic back pain   . Depression   . Headache     Patient Active Problem List   Diagnosis Date Noted  . Bipolar disorder, curr episode depressed, severe, w/psychotic features (HCC) 06/30/2016  . Moderate benzodiazepine use disorder (HCC) 06/30/2016  . Opioid use disorder, moderate, dependence (HCC) 06/30/2016  . Stimulant use disorder 06/30/2016  . Chronic pain syndrome 06/30/2016  . History of back surgery 06/30/2016  . S/P insertion of spinal cord stimulator 06/30/2016  . Polysubstance dependence (HCC) 06/29/2016  . Major depressive disorder, recurrent, moderate (HCC) 06/29/2016  . Chronic pain 05/18/2016  . Viral illness 11/26/2011  . Depression 11/26/2011  . Chronic  lower back pain 11/26/2011    Past Surgical History:  Procedure Laterality Date  . DENTAL SURGERY    . NASAL SEPTUM SURGERY    . SPINAL CORD STIMULATOR INSERTION N/A 05/18/2016   Procedure: LUMBAR SPINAL CORD STIMULATOR INSERTION;  Surgeon: Venita Lick, MD;  Location: [facility] OR;  Service: Orthopedics;  Laterality: N/A;    OB History    No data available       Home Medications    Prior to Admission medications   Medication Sig Start Date End Date Taking? Authorizing Provider  cloNIDine (CATAPRES - DOSED IN MG/24 HR) 0.1 mg/24hr patch Place 0.1 mg onto the skin once a week.   Yes Historical Provider, MD  cloNIDine (CATAPRES) 0.2 MG tablet Take 0.2 mg by mouth 2 (two) times daily.   Yes Historical Provider, MD  doxycycline (VIBRA-TABS) 100 MG tablet Take 100 mg by mouth 2 (two) times daily.   Yes Historical Provider, MD  DULoxetine (CYMBALTA) 20 MG capsule Take 1 capsule (20 mg total) by mouth daily. 07/05/16  Yes Alicia Brook, NP  gabapentin (NEURONTIN) 300 MG capsule Take 1 capsule (300 mg total) by mouth 3 (three) times daily. 07/04/16  Yes Alicia Brook, NP  mupirocin cream (BACTROBAN) 2 % Apply 1 application topically 2 (two) times daily.   Yes Historical Provider, MD  risperiDONE (RISPERDAL) 0.5 MG tablet Take 1 tablet (0.5 mg total) by mouth at bedtime. 07/04/16  Yes Alicia Brook, NP  benztropine (COGENTIN) 0.5 MG tablet Take 1 tablet (0.5 mg total) by mouth at  bedtime. 07/04/16   Alicia BrookSheila Agustin, NP  carbamazepine (TEGRETOL XR) 100 MG 12 hr tablet Take 1 tablet (100 mg total) by mouth 2 (two) times daily. 07/04/16   Alicia BrookSheila Agustin, NP  nicotine polacrilex (NICORETTE) 2 MG gum Take 1 each (2 mg total) by mouth as needed for smoking cessation. 07/04/16   Alicia BrookSheila Agustin, NP  traZODone (DESYREL) 50 MG tablet Take 1 tablet (50 mg total) by mouth at bedtime. 07/04/16   Alicia BrookSheila Agustin, NP  triamcinolone ointment (KENALOG) 0.5 % Apply 1 application topically 2 (two) times daily.  08/05/16   Alicia Mornavid Smith, NP    Family History Family History  Problem Relation Age of Onset  . Depression Mother   . Hypertension Father   . Heart failure Father   . Stroke Father     Social History Social History  Substance Use Topics  . Smoking status: Current Every Day Smoker    Packs/day: 0.50    Years: 20.00    Types: Cigarettes  . Smokeless tobacco: Never Used     Comment: 1/2 pack per day  . Alcohol use No     Comment: 4-5 beers daily     Allergies   Diclofenac and Sulfa antibiotics   Review of Systems Review of Systems  Constitutional: Positive for chills and fever.  Skin: Positive for rash (forearms bilaterally).  All other systems reviewed and are negative.    Physical Exam Updated Vital Signs There were no vitals taken for this visit.  Physical Exam  Constitutional: She appears well-developed and well-nourished. No distress.  HENT:  Head: Normocephalic and atraumatic.  Eyes: Conjunctivae are normal.  Neck: Neck supple.  Cardiovascular: Normal rate and regular rhythm.   No murmur heard. Pulmonary/Chest: Effort normal and breath sounds normal. No respiratory distress.  Abdominal: Soft. There is no tenderness.  Musculoskeletal: She exhibits no edema.  Neurological: She is alert.  Skin: Skin is warm and dry. Rash noted.  Pruritic rash to the forearms bilaterally. No indication of hand/interweb involvement to suggest scabies.  Psychiatric: She has a normal mood and affect.  Nursing note and vitals reviewed.    ED Treatments / Results   DIAGNOSTIC STUDIES:  COORDINATION OF CARE: 6:21 PM-Discussed next steps with pt. Pt verbalized understanding and is agreeable with the plan.    Labs (all labs ordered are listed, but only abnormal results are displayed) Labs Reviewed - No data to display  EKG  EKG Interpretation None       Radiology No results found.  Procedures Procedures (including critical care time)  Medications Ordered in  ED Medications - No data to display   Initial Impression / Assessment and Plan / ED Course  I have reviewed the triage vital signs and the nursing notes.  Pertinent labs & imaging results that were available during my care of the patient were reviewed by me and considered in my medical decision making (see chart for details).  Clinical Course   Patient with nonspecific eruption. No signs of infection. Discharge with symptomatic treatment. Follow up with PCP in 2-3 days.          Final Clinical Impressions(s) / ED Diagnoses   Final diagnoses:  Pruritic rash    New Prescriptions New Prescriptions   TRIAMCINOLONE OINTMENT (KENALOG) 0.5 %    Apply 1 application topically 2 (two) times daily.   I personally performed the services described in this documentation, which was scribed in my presence. The recorded information has been reviewed and is accurate.  Alicia Morn, NP 08/06/16 [id number]    Raeford Razor, MD 08/14/16 [phone]

## 2017-07-24 HISTORY — PX: NASAL SEPTUM SURGERY: SHX37

## 2017-09-10 IMAGING — RF DG C-ARM 61-120 MIN
1 series · 2 of 2 positions shown · non-contrast
Comparison: None.

CLINICAL DATA: Fluoroscopic imaging provided for lumbar spine cord
stimulator insertion. Fluoroscopy time 21 seconds.

EXAM:
THORACIC SPINE 2 VIEWS ; DG C-ARM 61-120 MIN

[Series 1: run · 2 of 2 slices shown]
[im 1/2]
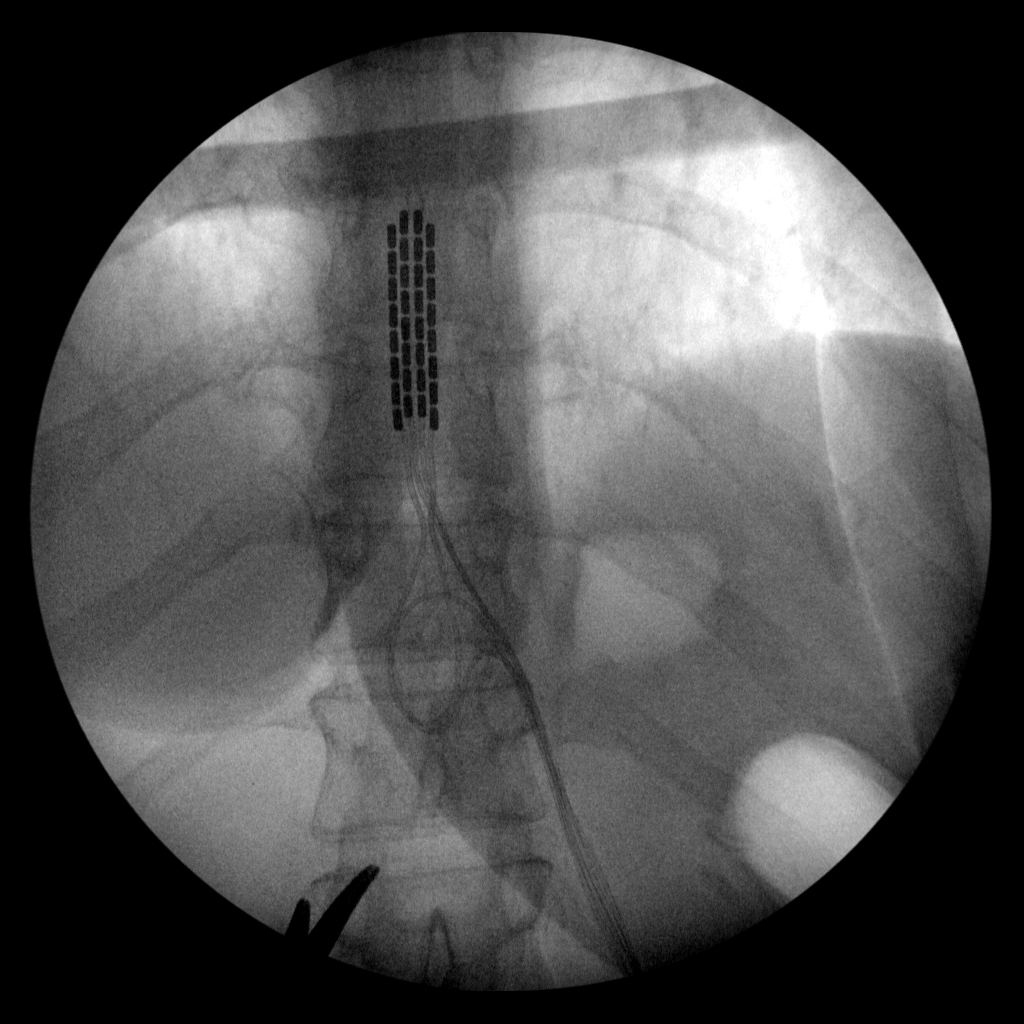
[im 2/2]
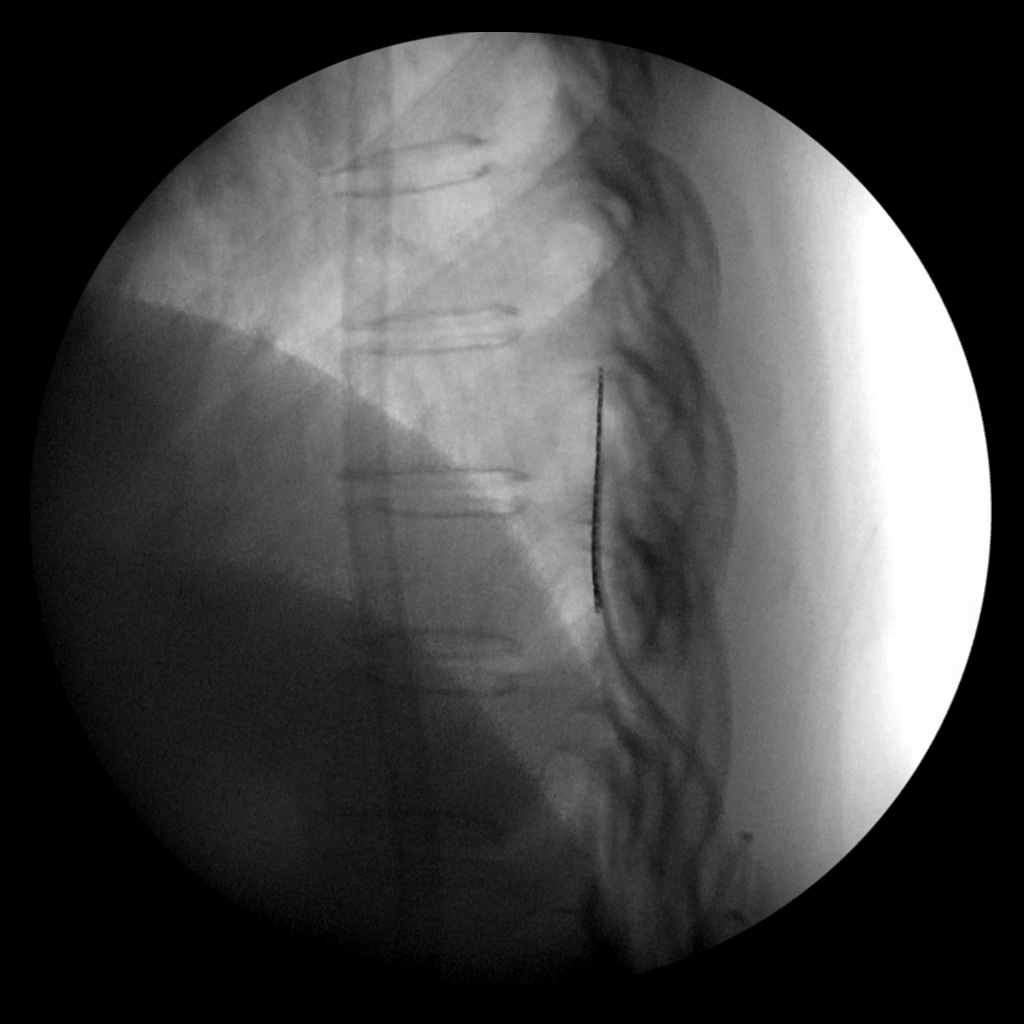

[2 of 2 positions shown; findings below may reference images not displayed]

FINDINGS: Imaging shows placement of spine stimulator leads along the dorsal
aspect of the spinal canal at the T9 and T10 levels.
IMPRESSION: Imaging provided for spine stimulator lead placement.

## 2019-04-23 ENCOUNTER — Ambulatory Visit: Payer: Medicare HMO | Admitting: Obstetrics and Gynecology

## 2019-05-15 ENCOUNTER — Encounter: Payer: Self-pay | Admitting: Obstetrics and Gynecology

## 2019-05-15 ENCOUNTER — Other Ambulatory Visit: Payer: Self-pay

## 2019-05-15 ENCOUNTER — Other Ambulatory Visit (HOSPITAL_COMMUNITY)
Admission: RE | Admit: 2019-05-15 | Discharge: 2019-05-15 | Disposition: A | Discharge status: Home / Self Care | Payer: Medicare Other | Source: Ambulatory Visit | Attending: Obstetrics and Gynecology | Admitting: Obstetrics and Gynecology

## 2019-05-15 ENCOUNTER — Ambulatory Visit (INDEPENDENT_AMBULATORY_CARE_PROVIDER_SITE_OTHER): Payer: Medicare Other | Admitting: Obstetrics and Gynecology

## 2019-05-15 DIAGNOSIS — Z3009 Encounter for other general counseling and advice on contraception: Secondary | ICD-10-CM

## 2019-05-15 DIAGNOSIS — Z1151 Encounter for screening for human papillomavirus (HPV): Secondary | ICD-10-CM | POA: Diagnosis not present

## 2019-05-15 DIAGNOSIS — Z01419 Encounter for gynecological examination (general) (routine) without abnormal findings: Secondary | ICD-10-CM

## 2019-05-15 DIAGNOSIS — Z113 Encounter for screening for infections with a predominantly sexual mode of transmission: Secondary | ICD-10-CM

## 2019-05-15 DIAGNOSIS — Z1231 Encounter for screening mammogram for malignant neoplasm of breast: Secondary | ICD-10-CM

## 2019-05-15 DIAGNOSIS — Z124 Encounter for screening for malignant neoplasm of cervix: Secondary | ICD-10-CM | POA: Insufficient documentation

## 2019-05-15 NOTE — Progress Notes (Signed)
GYNECOLOGY ANNUAL PREVENTATIVE CARE ENCOUNTER NOTE  Subjective:   Alicia Logan is a 48 y.o. G0P0000 female here for a annual gynecologic exam. Current complaints: establish care and cont depo.   Denies abnormal vaginal bleeding, discharge, pelvic pain, or other gynecologic concerns.   Reports some bloating in her stomach every time she eats, states it has always been this way. No weight gain/loss. Exercises regularly.   Declines STI screen, patient is not sexually active.   Has psych history, has been admitted twice. Has established care with psychiatry here and doing well.    Gynecologic History No LMP recorded. (Menstrual status: Irregular Periods). Contraception: Depo-Provera injections Last Pap: yearly. Results were: normal, abnormal around age 76 Last mammogram: has never had one  Obstetric History OB History  Gravida Para Term Preterm AB Living  0 0 0 0 0 0  SAB TAB Ectopic Multiple Live Births  0 0 0 0 0   Past Medical History:  Diagnosis Date  . Anxiety   . Bipolar 1 disorder ([place])   . Chronic back pain   . Depression   . Headache     Past Surgical History:  Procedure Laterality Date  . ARTHROSCOPIC REPAIR PCL    . DENTAL SURGERY    . NASAL SEPTUM SURGERY  07/2017  . SPINAL CORD STIMULATOR INSERTION N/A 05/18/2016   Procedure: LUMBAR SPINAL CORD STIMULATOR INSERTION;  Surgeon: Melina Schools, MD;  Location: [facility];  Service: Orthopedics;  Laterality: N/A;   Current Outpatient Medications on File Prior to Visit  Medication Sig Dispense Refill  . buPROPion (WELLBUTRIN SR) 150 MG 12 hr tablet Take 150 mg by mouth 2 (two) times daily.    . cyclobenzaprine (FLEXERIL) 10 MG tablet Take 10 mg by mouth 3 (three) times daily as needed for muscle spasms.    Marland Kitchen gabapentin (NEURONTIN) 300 MG capsule Take 1 capsule (300 mg total) by mouth 3 (three) times daily. 90 capsule 0  . medroxyPROGESTERone (DEPO-PROVERA) 150 MG/ML injection Inject 150 mg into the muscle every 3  (three) months.    . QUEtiapine (SEROQUEL) 25 MG tablet Take 25 mg by mouth 3 (three) times daily. PRN    . traZODone (DESYREL) 50 MG tablet Take 1 tablet (50 mg total) by mouth at bedtime. 30 tablet 0  . ziprasidone (GEODON) 20 MG capsule Take 60 mg by mouth See admin instructions. 20 mg in morning, 40 mg at night    . benztropine (COGENTIN) 0.5 MG tablet Take 1 tablet (0.5 mg total) by mouth at bedtime. (Patient not taking: Reported on 05/15/2019) 30 tablet 0  . carbamazepine (TEGRETOL XR) 100 MG 12 hr tablet Take 1 tablet (100 mg total) by mouth 2 (two) times daily. (Patient not taking: Reported on 05/15/2019) 60 tablet 0  . cloNIDine (CATAPRES - DOSED IN MG/24 HR) 0.1 mg/24hr patch Place 0.1 mg onto the skin once a week.    . cloNIDine (CATAPRES) 0.2 MG tablet Take 0.2 mg by mouth 2 (two) times daily.    Marland Kitchen doxycycline (VIBRA-TABS) 100 MG tablet Take 100 mg by mouth 2 (two) times daily.    . DULoxetine (CYMBALTA) 20 MG capsule Take 1 capsule (20 mg total) by mouth daily. (Patient not taking: Reported on 05/15/2019) 30 capsule 0  . mupirocin cream (BACTROBAN) 2 % Apply 1 application topically 2 (two) times daily.    . nicotine polacrilex (NICORETTE) 2 MG gum Take 1 each (2 mg total) by mouth as needed for smoking cessation. (Patient  not taking: Reported on 05/15/2019) 100 tablet 0  . risperiDONE (RISPERDAL) 0.5 MG tablet Take 1 tablet (0.5 mg total) by mouth at bedtime. (Patient not taking: Reported on 05/15/2019) 30 tablet 0  . triamcinolone ointment (KENALOG) 0.5 % Apply 1 application topically 2 (two) times daily. (Patient not taking: Reported on 05/15/2019) 30 g 0   No current facility-administered medications on file prior to visit.     Allergies  Allergen Reactions  . Sulfa Antibiotics Nausea And Vomiting and Other (See Comments)    sulfa    Social History   Socioeconomic History  . Marital status: Single    Spouse name: Not on file  . Number of children: Not on file  . Years of  education: Not on file  . Highest education level: Not on file  Occupational History  . Not on file  Social Needs  . Financial resource strain: Not on file  . Food insecurity    Worry: Not on file    Inability: Not on file  . Transportation needs    Medical: Not on file    Non-medical: Not on file  Tobacco Use  . Smoking status: Former Smoker    Packs/day: 0.50    Years: 20.00    Pack years: 10.00    Types: Cigarettes  . Smokeless tobacco: Never Used  . Tobacco comment: 1/2 pack per day  Substance and Sexual Activity  . Alcohol use: No    Alcohol/week: 5.0 standard drinks    Types: 5 Cans of beer per week    Comment: 4-5 beers daily  . Drug use: Yes    Types: Marijuana    Comment: stopped  . Sexual activity: Not Currently    Birth control/protection: Injection, Abstinence  Lifestyle  . Physical activity    Days per week: Not on file    Minutes per session: Not on file  . Stress: Not on file  Relationships  . Social Musicianconnections    Talks on phone: Not on file    Gets together: Not on file    Attends religious service: Not on file    Active member of club or organization: Not on file    Attends meetings of clubs or organizations: Not on file    Relationship status: Not on file  . Intimate partner violence    Fear of current or ex partner: Not on file    Emotionally abused: Not on file    Physically abused: Not on file    Forced sexual activity: Not on file  Other Topics Concern  . Not on file  Social History Narrative  . Not on file    Family History  Problem Relation Age of Onset  . Depression Mother   . Hypertension Father   . Heart failure Father   . Stroke Father     The following portions of the patient's history were reviewed and updated as appropriate: allergies, current medications, past family history, past medical history, past social history, past surgical history and problem list.  Review of Systems Pertinent items are noted in HPI.    Objective:  There were no vitals taken for this visit. CONSTITUTIONAL: Well-developed, well-nourished female in no acute distress.  HENT:  Normocephalic, atraumatic, External right and left ear normal. Oropharynx is clear and moist EYES: Conjunctivae and EOM are normal. Pupils are equal, round, and reactive to light. No scleral icterus.  NECK: Normal range of motion, supple, no masses.  Normal thyroid.  SKIN: Skin is  warm and dry. No rash noted. Not diaphoretic. No erythema. No pallor. NEUROLOGIC: Alert and oriented to person, place, and time. Normal reflexes, muscle tone coordination. No cranial nerve deficit noted. PSYCHIATRIC: Normal mood and affect. Normal behavior. Normal judgment and thought content. CARDIOVASCULAR: Normal heart rate noted, regular rhythm RESPIRATORY: Effort normal, no problems with respiration noted. BREASTS: Symmetric in size. No masses, skin changes, nipple drainage, or lymphadenopathy. ABDOMEN: Soft, normal bowel sounds, no distention noted.  No tenderness, rebound or guarding.  PELVIC: Normal appearing external genitalia; normal appearing vaginal mucosa and cervix.  No abnormal discharge noted.  Pap smear obtained.  Normal uterine size, no other palpable masses, no uterine or adnexal tenderness. MUSCULOSKELETAL: Normal range of motion. No tenderness.  No cyanosis, clubbing, or edema.  2+ distal pulses.  Exam done with chaperone present.  Assessment and Plan:   1. Well woman exam Reviewed low risk of ovarian cancer, bloating sounds functional since she has always had it and that there is no ovarian cancer screening tool, but if it continues or she notices worsening, to return for followup, offered GI referral, she declines today  2. Routine screening for STI (sexually transmitted infection) Declines STI screen  3. Cervical cancer screening Completed today - Cytology - PAP( [place])  4. Breast cancer screening by mammogram Ordered today - MM Digital  Screening; Future  5. Encounter for counseling regarding contraception Patient has been on depo long term, would liek to continue, reviewed risks/benefits of continuing, specifically risk of bone density loss, she verbalizes understanding and would like to continue as she states her periods are horrific when she misses a depo shot  Will follow up results of pap smear screen and manage accordingly. Encouraged improvement in diet and exercise.  Mammogram ordered today Referral for colonoscopy made Flu vaccine done per patient  Routine preventative health maintenance measures emphasized. Please refer to After Visit Summary for other counseling recommendations.   Total face-to-face time with patient: 30 minutes. Over 50% of encounter was spent on counseling and coordination of care.   Baldemar Lenis, M.D. Attending [facility])

## 2019-05-15 NOTE — Progress Notes (Signed)
New patient is in the office, currently on depo, sister is a Marine scientist and has been administering injections, last depo 03-13-19. Pt states she is not sexually active. Pt states that she already received flu vaccine in August.

## 2019-05-20 LAB — CYTOLOGY - PAP
Comment: NEGATIVE
Diagnosis: NEGATIVE
High risk HPV: NEGATIVE

## 2019-06-12 ENCOUNTER — Ambulatory Visit (INDEPENDENT_AMBULATORY_CARE_PROVIDER_SITE_OTHER): Payer: Medicare PPO

## 2019-06-12 ENCOUNTER — Other Ambulatory Visit: Payer: Self-pay

## 2019-06-12 DIAGNOSIS — Z3042 Encounter for surveillance of injectable contraceptive: Secondary | ICD-10-CM | POA: Diagnosis not present

## 2019-06-12 MED ORDER — MEDROXYPROGESTERONE ACETATE 150 MG/ML IM SUSP: 150.0000 mg | INTRAMUSCULAR | 3 refills | Status: DC

## 2019-06-12 MED ORDER — MEDROXYPROGESTERONE ACETATE 150 MG/ML IM SUSP
150.0000 mg | Freq: Once | INTRAMUSCULAR | Status: AC
  Administered 2019-06-12: 10:00:00 150 mg via INTRAMUSCULAR

## 2019-06-12 NOTE — Progress Notes (Signed)
I have reviewed this chart and agree with the RN/CMA assessment and management.    K. Meryl Davis, M.D. Attending [facility]   

## 2019-06-12 NOTE — Progress Notes (Signed)
Pt presents for a depo injection. Today is the last day in her window. Inj given in RUOQ. Pt tolerated well. Next depo is due 2/4-2/18.

## 2019-07-30 ENCOUNTER — Ambulatory Visit
Admission: RE | Admit: 2019-07-30 | Discharge: 2019-07-30 | Disposition: A | Discharge status: Home / Self Care | Payer: Medicare Other | Source: Ambulatory Visit | Attending: Obstetrics and Gynecology | Admitting: Obstetrics and Gynecology

## 2019-07-30 ENCOUNTER — Other Ambulatory Visit: Payer: Self-pay

## 2019-07-30 DIAGNOSIS — Z1231 Encounter for screening mammogram for malignant neoplasm of breast: Secondary | ICD-10-CM

## 2019-09-04 ENCOUNTER — Other Ambulatory Visit: Payer: Self-pay

## 2019-09-04 ENCOUNTER — Ambulatory Visit (INDEPENDENT_AMBULATORY_CARE_PROVIDER_SITE_OTHER): Payer: Medicare PPO

## 2019-09-04 DIAGNOSIS — Z3042 Encounter for surveillance of injectable contraceptive: Secondary | ICD-10-CM

## 2019-09-04 MED ORDER — MEDROXYPROGESTERONE ACETATE 150 MG/ML IM SUSP
150.0000 mg | Freq: Once | INTRAMUSCULAR | Status: AC
  Administered 2019-09-04: 09:00:00 150 mg via INTRAMUSCULAR

## 2019-09-04 NOTE — Progress Notes (Signed)
Pt presents for depo injection. Patient is within her window. Inj given in RUOQ. Pt tolerated well. Next depo due 4/29-5/13.

## 2019-09-04 NOTE — Progress Notes (Signed)
Patient seen and assessed by nursing staff during this encounter. I have reviewed the chart and agree with the documentation and plan.  Love Chowning, MD 09/04/2019 10:38 AM    

## 2019-11-27 ENCOUNTER — Other Ambulatory Visit: Payer: Self-pay

## 2019-11-27 ENCOUNTER — Ambulatory Visit (INDEPENDENT_AMBULATORY_CARE_PROVIDER_SITE_OTHER): Payer: Medicare PPO

## 2019-11-27 VITALS — BP 103/66 | HR 93 | Wt 153.6 lb

## 2019-11-27 DIAGNOSIS — Z3042 Encounter for surveillance of injectable contraceptive: Secondary | ICD-10-CM | POA: Diagnosis not present

## 2019-11-27 MED ORDER — MEDROXYPROGESTERONE ACETATE 150 MG/ML IM SUSP
150.0000 mg | Freq: Once | INTRAMUSCULAR | Status: AC
  Administered 2019-11-27: 150 mg via INTRAMUSCULAR

## 2019-11-27 NOTE — Progress Notes (Signed)
Patient ID: Alicia Logan, female   DOB: Mar 31, 1971, 49 y.o.   MRN: [id number] Patient seen and assessed by nursing staff during this encounter. I have reviewed the chart and agree with the documentation and plan. I have also made any necessary editorial changes.  Scheryl Darter, MD 11/27/2019 1:08 PM

## 2019-11-27 NOTE — Progress Notes (Signed)
Presents for DEPO, given in RUOQ, tolerated well.  Next DEPO July 22 - February 26, 2020  Administrations This Visit    medroxyPROGESTERone (DEPO-PROVERA) injection 150 mg    Admin Date 11/27/2019 Action Given Dose 150 mg Route Intramuscular Administered By Maretta Bees, RMA

## 2020-02-19 ENCOUNTER — Other Ambulatory Visit: Payer: Self-pay

## 2020-02-19 ENCOUNTER — Ambulatory Visit (INDEPENDENT_AMBULATORY_CARE_PROVIDER_SITE_OTHER): Payer: Medicare PPO

## 2020-02-19 DIAGNOSIS — Z3042 Encounter for surveillance of injectable contraceptive: Secondary | ICD-10-CM | POA: Diagnosis not present

## 2020-02-19 MED ORDER — MEDROXYPROGESTERONE ACETATE 150 MG/ML IM SUSP
150.0000 mg | INTRAMUSCULAR | Status: DC
  Administered 2020-02-19 – 2022-03-16 (×5): 150 mg via INTRAMUSCULAR

## 2020-02-19 NOTE — Progress Notes (Signed)
Pt is in the office for depo injection, administered in RUOQ, per pt request, pt tolerated well. Next due Oct 14-28. .. Administrations This Visit    medroxyPROGESTERone (DEPO-PROVERA) injection 150 mg    Admin Date 02/19/2020 Action Given Dose 150 mg Route Intramuscular Administered By Katrina Stack, RN

## 2020-02-19 NOTE — Progress Notes (Signed)
Patient was assessed and managed by nursing staff during this encounter. I have reviewed the chart and agree with the documentation and plan. I have also made any necessary editorial changes.  Afnan Cadiente A Kempton Milne, MD 02/19/2020 1:15 PM   

## 2020-03-12 ENCOUNTER — Other Ambulatory Visit: Payer: Self-pay

## 2020-03-12 ENCOUNTER — Ambulatory Visit: Payer: Medicare PPO | Admitting: Obstetrics and Gynecology

## 2020-03-12 ENCOUNTER — Other Ambulatory Visit (HOSPITAL_COMMUNITY)
Admission: RE | Admit: 2020-03-12 | Discharge: 2020-03-12 | Disposition: A | Discharge status: Home / Self Care | Payer: Medicare PPO | Source: Ambulatory Visit | Attending: Obstetrics and Gynecology | Admitting: Obstetrics and Gynecology

## 2020-03-12 ENCOUNTER — Encounter: Payer: Self-pay | Admitting: Obstetrics and Gynecology

## 2020-03-12 VITALS — BP 143/91 | HR 86 | Wt 163.0 lb

## 2020-03-12 DIAGNOSIS — Z124 Encounter for screening for malignant neoplasm of cervix: Secondary | ICD-10-CM | POA: Diagnosis present

## 2020-03-12 DIAGNOSIS — Z1151 Encounter for screening for human papillomavirus (HPV): Secondary | ICD-10-CM | POA: Insufficient documentation

## 2020-03-12 DIAGNOSIS — N9089 Other specified noninflammatory disorders of vulva and perineum: Secondary | ICD-10-CM

## 2020-03-12 NOTE — Progress Notes (Signed)
RGYN patient present for problem visit today.  JO:ITGPQDI bump, no pain, no bleeding pt states bump/knot has got larger.  No recent intercourse in x 2 yrs .

## 2020-03-12 NOTE — Progress Notes (Signed)
GYNECOLOGY OFFICE VISIT NOTE  History:  49 y.o. G0P0000 here today for bump in vulva. Has increased in size the last few months. No pain, itching, bleeding. Has not tried anything.   Past Medical History:  Diagnosis Date  . Anxiety   . Bipolar 1 disorder ([facility])   . Chronic back pain   . Depression   . Headache     Past Surgical History:  Procedure Laterality Date  . ARTHROSCOPIC REPAIR PCL    . DENTAL SURGERY    . NASAL SEPTUM SURGERY  07/2017  . SPINAL CORD STIMULATOR INSERTION N/A 05/18/2016   Procedure: LUMBAR SPINAL CORD STIMULATOR INSERTION;  Surgeon: Venita Lick, MD;  Location: [facility];  Service: Orthopedics;  Laterality: N/A;     Current Outpatient Medications:  .  buPROPion (WELLBUTRIN SR) 150 MG 12 hr tablet, Take 150 mg by mouth 2 (two) times daily., Disp: , Rfl:  .  gabapentin (NEURONTIN) 300 MG capsule, Take 1 capsule (300 mg total) by mouth 3 (three) times daily., Disp: 90 capsule, Rfl: 0 .  medroxyPROGESTERone (DEPO-PROVERA) 150 MG/ML injection, Inject 150 mg into the muscle every 3 (three) months., Disp: , Rfl:  .  medroxyPROGESTERone (DEPO-PROVERA) 150 MG/ML injection, Inject 1 mL (150 mg total) into the muscle every 3 (three) months., Disp: 1 mL, Rfl: 3 .  QUEtiapine (SEROQUEL) 25 MG tablet, Take 25 mg by mouth 3 (three) times daily. PRN, Disp: , Rfl:  .  ziprasidone (GEODON) 20 MG capsule, Take 60 mg by mouth See admin instructions. 20 mg in morning, 40 mg at night, Disp: , Rfl:  .  benztropine (COGENTIN) 0.5 MG tablet, Take 1 tablet (0.5 mg total) by mouth at bedtime. (Patient not taking: Reported on 05/15/2019), Disp: 30 tablet, Rfl: 0 .  carbamazepine (TEGRETOL XR) 100 MG 12 hr tablet, Take 1 tablet (100 mg total) by mouth 2 (two) times daily. (Patient not taking: Reported on 05/15/2019), Disp: 60 tablet, Rfl: 0 .  cloNIDine (CATAPRES - DOSED IN MG/24 HR) 0.1 mg/24hr patch, Place 0.1 mg onto the skin once a week., Disp: , Rfl:  .  cloNIDine (CATAPRES) 0.2 MG  tablet, Take 0.2 mg by mouth 2 (two) times daily., Disp: , Rfl:  .  cyclobenzaprine (FLEXERIL) 10 MG tablet, Take 10 mg by mouth 3 (three) times daily as needed for muscle spasms., Disp: , Rfl:  .  doxycycline (VIBRA-TABS) 100 MG tablet, Take 100 mg by mouth 2 (two) times daily. (Patient not taking: Reported on 03/12/2020), Disp: , Rfl:  .  DULoxetine (CYMBALTA) 20 MG capsule, Take 1 capsule (20 mg total) by mouth daily. (Patient not taking: Reported on 05/15/2019), Disp: 30 capsule, Rfl: 0 .  mupirocin cream (BACTROBAN) 2 %, Apply 1 application topically 2 (two) times daily., Disp: , Rfl:  .  nicotine polacrilex (NICORETTE) 2 MG gum, Take 1 each (2 mg total) by mouth as needed for smoking cessation. (Patient not taking: Reported on 05/15/2019), Disp: 100 tablet, Rfl: 0 .  risperiDONE (RISPERDAL) 0.5 MG tablet, Take 1 tablet (0.5 mg total) by mouth at bedtime. (Patient not taking: Reported on 05/15/2019), Disp: 30 tablet, Rfl: 0 .  traZODone (DESYREL) 50 MG tablet, Take 1 tablet (50 mg total) by mouth at bedtime. (Patient not taking: Reported on 03/12/2020), Disp: 30 tablet, Rfl: 0 .  triamcinolone ointment (KENALOG) 0.5 %, Apply 1 application topically 2 (two) times daily. (Patient not taking: Reported on 05/15/2019), Disp: 30 g, Rfl: 0  Current Facility-Administered Medications:  .  medroxyPROGESTERone (DEPO-PROVERA) injection 150 mg, 150 mg, Intramuscular, Q90 days, Warden Fillers, MD, 150 mg at 02/19/20 [id number]  The following portions of the patient's history were reviewed and updated as appropriate: allergies, current medications, past family history, past medical history, past social history, past surgical history and problem list.   Last pap: 04/2019 negative Mammogram: [facility] 1 07/2019  Review of Systems:  Pertinent items noted in HPI and remainder of comprehensive ROS otherwise negative.   Objective:  Physical Exam BP (!) 143/91   Pulse 86   Wt 163 lb (73.9 kg)   BMI 24.78 kg/m    CONSTITUTIONAL: Well-developed, well-nourished female in no acute distress.  HENT:  Normocephalic, atraumatic. External right and left ear normal. Oropharynx is clear and moist EYES: Conjunctivae and EOM are normal. Pupils are equal, round, and reactive to light. No scleral icterus.  NECK: Normal range of motion, supple, no masses SKIN: Skin is warm and dry. No rash noted. Not diaphoretic. No erythema. No pallor. NEUROLOGIC: Alert and oriented to person, place, and time. Normal reflexes, muscle tone coordination. No cranial nerve deficit noted. PSYCHIATRIC: Normal mood and affect. Normal behavior. Normal judgment and thought content. CARDIOVASCULAR: Normal heart rate noted RESPIRATORY: Effort normal, no problems with respiration noted ABDOMEN: Soft, no distention noted.   PELVIC: Normal appearing external genitalia; 0.5 x 1 cm firm, mobile sebaceous cyst just lateral of right clitoris, non-tender, normal appearing vaginal mucosa and cervix.  No abnormal discharge noted.  Pap smear obtained.   MUSCULOSKELETAL: Normal range of motion. No edema noted.  Exam done with chaperone present.  Labs and Imaging No results found.  Assessment & Plan:   1. Screening for cervical cancer - Cytology - PAP( [place])  2. Vulvar lesion Appearance of sebaceous cyst Offered incision and drainage today versus expectant management, pt opts for expectant management - encouraged sitz baths/warm compress - return for incision if enlarging or no improvement   Routine preventative health maintenance measures emphasized. Please refer to After Visit Summary for other counseling recommendations.   Return if symptoms worsen or fail to improve.   Total face-to-face time with patient: 12 minutes. Over 50% of encounter was spent on counseling and coordination of care.   Baldemar Lenis, M.D. Attending [facility])

## 2020-03-15 LAB — CYTOLOGY - PAP
Comment: NEGATIVE
Diagnosis: NEGATIVE
High risk HPV: NEGATIVE

## 2020-03-30 ENCOUNTER — Ambulatory Visit: Payer: Medicare PPO

## 2020-05-10 ENCOUNTER — Ambulatory Visit: Payer: Medicare PPO

## 2020-05-12 ENCOUNTER — Other Ambulatory Visit: Payer: Self-pay

## 2020-05-12 ENCOUNTER — Ambulatory Visit (INDEPENDENT_AMBULATORY_CARE_PROVIDER_SITE_OTHER): Payer: Medicare PPO

## 2020-05-12 DIAGNOSIS — Z3042 Encounter for surveillance of injectable contraceptive: Secondary | ICD-10-CM

## 2020-05-12 NOTE — Progress Notes (Addendum)
Nurse visit for pt supply Depo  Pt is on time for inj Injection can only be given in RUOQ due to spinal cord stimulator implanted in LUOQ area per pt  Next Depo due Jan 5-19, pt agrees.   During visit, pt c/o no relief with vaginal cyst.  Pt will schedule provider visit at checkout.   Administrations This Visit    medroxyPROGESTERone (DEPO-PROVERA) injection 150 mg    Admin Date 05/12/2020 Action Given Dose 150 mg Route Intramuscular Administered By Lewayne Bunting, CMA

## 2020-05-12 NOTE — Progress Notes (Signed)
Patient was assessed and managed by nursing staff during this encounter. I have reviewed the chart and agree with the documentation and plan. I have also made any necessary editorial changes.  Jayel Scaduto A Darel Ricketts, MD 05/12/2020 4:17 PM   

## 2020-06-02 ENCOUNTER — Ambulatory Visit: Payer: Medicare PPO | Admitting: Obstetrics and Gynecology

## 2020-06-02 ENCOUNTER — Other Ambulatory Visit: Payer: Self-pay

## 2020-06-02 ENCOUNTER — Encounter: Payer: Self-pay | Admitting: Obstetrics and Gynecology

## 2020-06-02 ENCOUNTER — Encounter: Payer: Self-pay | Admitting: General Practice

## 2020-06-02 VITALS — BP 128/75 | HR 106 | Ht 69.0 in | Wt 162.0 lb

## 2020-06-02 DIAGNOSIS — N898 Other specified noninflammatory disorders of vagina: Secondary | ICD-10-CM | POA: Diagnosis not present

## 2020-06-02 DIAGNOSIS — L723 Sebaceous cyst: Secondary | ICD-10-CM | POA: Diagnosis not present

## 2020-06-02 NOTE — Progress Notes (Signed)
    GYNECOLOGY PROCEDURE NOTE  Alicia Logan is a 49 y.o. G0P0000 here for incision and drainage of sebaceous cyst on right vulva. Has been getting bigger. Denies pain or discharge from area. Does not bother her but she would like it drained/removed.   Reviewed risks of incision and drainage including worsening infection, bleeding, damage to surrounding tissue and organ, inability to drain abscess, pain, possible need for further intervention. She verbalizes understanding and affirms desire to proceed. Consent signed.   Incision and drainage Procedure Patient identified, informed consent performed, consent signed. An adequate timeout was performed. The right vulva at the level of the clitoris was exposed and prepped with alcohol swab and then injected with 3 mL 1% lidocaine. The sebaceous cyst was then prepped with betadine x3. A small incision was made in a vertical manner to good effect and sebaceous material expelled with some gentle pressure. Hemostasis obtained with small amount of pressure. She tolerated the procedure well.   Post incision instructions were given to patient. She will follow up as needed.    Baldemar Lenis, M.D. Attending [facility])

## 2020-06-02 NOTE — Patient Instructions (Signed)
  CARE OF INCISION  Keep incision clean and dry.

## 2020-07-29 ENCOUNTER — Other Ambulatory Visit: Payer: Self-pay | Admitting: Obstetrics and Gynecology

## 2020-07-29 DIAGNOSIS — Z3042 Encounter for surveillance of injectable contraceptive: Secondary | ICD-10-CM

## 2020-07-29 NOTE — Telephone Encounter (Signed)
Pt has appt next week, refill sent

## 2020-08-03 ENCOUNTER — Other Ambulatory Visit: Payer: Self-pay

## 2020-08-03 ENCOUNTER — Ambulatory Visit (INDEPENDENT_AMBULATORY_CARE_PROVIDER_SITE_OTHER): Payer: Medicare PPO

## 2020-08-03 VITALS — BP 109/61 | HR 72

## 2020-08-03 DIAGNOSIS — Z3042 Encounter for surveillance of injectable contraceptive: Secondary | ICD-10-CM | POA: Diagnosis not present

## 2020-08-03 MED ORDER — MEDROXYPROGESTERONE ACETATE 150 MG/ML IM SUSP
150.0000 mg | Freq: Once | INTRAMUSCULAR | Status: AC
  Administered 2020-08-03: 150 mg via INTRAMUSCULAR

## 2020-08-03 NOTE — Progress Notes (Signed)
Patient presented to the office today for Medroxyprogesterone  birth control injection. Given in her right upper outer quadrant Dose: 150 mg HCG Serum: N/A Patient will follow up in three month for next injection

## 2020-08-24 DIAGNOSIS — K529 Noninfective gastroenteritis and colitis, unspecified: Secondary | ICD-10-CM

## 2020-08-24 HISTORY — DX: Noninfective gastroenteritis and colitis, unspecified: K52.9

## 2020-09-14 ENCOUNTER — Other Ambulatory Visit: Payer: Self-pay | Admitting: Obstetrics and Gynecology

## 2020-09-14 DIAGNOSIS — Z1231 Encounter for screening mammogram for malignant neoplasm of breast: Secondary | ICD-10-CM

## 2020-11-02 ENCOUNTER — Other Ambulatory Visit: Payer: Self-pay

## 2020-11-02 ENCOUNTER — Ambulatory Visit (INDEPENDENT_AMBULATORY_CARE_PROVIDER_SITE_OTHER): Payer: Medicare PPO

## 2020-11-02 DIAGNOSIS — Z3042 Encounter for surveillance of injectable contraceptive: Secondary | ICD-10-CM | POA: Diagnosis not present

## 2020-11-02 MED ORDER — MEDROXYPROGESTERONE ACETATE 150 MG/ML IM SUSP: 150.0000 mg | INTRAMUSCULAR | 1 refills | Status: DC

## 2020-11-02 MED ORDER — MEDROXYPROGESTERONE ACETATE 150 MG/ML IM SUSP
150.0000 mg | Freq: Once | INTRAMUSCULAR | Status: AC
  Administered 2020-11-02: 150 mg via INTRAMUSCULAR

## 2020-11-02 NOTE — Progress Notes (Signed)
Patient presents for depo injection. Patient is within her window. Inj given in LUOQ. Patient tolerated well.

## 2020-11-04 ENCOUNTER — Ambulatory Visit
Admission: RE | Admit: 2020-11-04 | Discharge: 2020-11-04 | Disposition: A | Discharge status: Home / Self Care | Payer: Medicare PPO | Source: Ambulatory Visit | Attending: Obstetrics and Gynecology | Admitting: Obstetrics and Gynecology

## 2020-11-04 ENCOUNTER — Other Ambulatory Visit: Payer: Self-pay

## 2020-11-04 DIAGNOSIS — Z1231 Encounter for screening mammogram for malignant neoplasm of breast: Secondary | ICD-10-CM

## 2021-01-20 ENCOUNTER — Ambulatory Visit (INDEPENDENT_AMBULATORY_CARE_PROVIDER_SITE_OTHER): Payer: Medicare PPO

## 2021-01-20 ENCOUNTER — Other Ambulatory Visit: Payer: Self-pay

## 2021-01-20 VITALS — BP 119/75 | HR 78

## 2021-01-20 DIAGNOSIS — Z3042 Encounter for surveillance of injectable contraceptive: Secondary | ICD-10-CM

## 2021-01-20 MED ORDER — MEDROXYPROGESTERONE ACETATE 150 MG/ML IM SUSP
150.0000 mg | Freq: Once | INTRAMUSCULAR | Status: AC
  Administered 2021-01-20: 150 mg via INTRAMUSCULAR

## 2021-01-20 NOTE — Progress Notes (Signed)
Patient presented to the office today for her depo-provera.  Given By:D.Derriona Branscom Depo-Provera given in RUOQ IM HCG Serum: N/A Side Effects: N/A Follow Up: 06/15-06-29

## 2021-01-26 ENCOUNTER — Other Ambulatory Visit: Payer: Self-pay | Admitting: Obstetrics and Gynecology

## 2021-01-26 DIAGNOSIS — Z3042 Encounter for surveillance of injectable contraceptive: Secondary | ICD-10-CM

## 2021-03-15 ENCOUNTER — Encounter: Payer: Self-pay | Admitting: Gastroenterology

## 2021-04-07 ENCOUNTER — Ambulatory Visit (INDEPENDENT_AMBULATORY_CARE_PROVIDER_SITE_OTHER): Payer: Medicare PPO | Admitting: Gastroenterology

## 2021-04-07 ENCOUNTER — Other Ambulatory Visit: Payer: Self-pay

## 2021-04-07 ENCOUNTER — Encounter: Payer: Self-pay | Admitting: Gastroenterology

## 2021-04-07 VITALS — BP 110/74 | HR 75 | Ht 69.0 in | Wt 154.2 lb

## 2021-04-07 DIAGNOSIS — R152 Fecal urgency: Secondary | ICD-10-CM

## 2021-04-07 DIAGNOSIS — R112 Nausea with vomiting, unspecified: Secondary | ICD-10-CM

## 2021-04-07 DIAGNOSIS — R197 Diarrhea, unspecified: Secondary | ICD-10-CM | POA: Diagnosis not present

## 2021-04-07 DIAGNOSIS — R109 Unspecified abdominal pain: Secondary | ICD-10-CM

## 2021-04-07 MED ORDER — CLENPIQ 10-3.5-12 MG-GM -GM/160ML PO SOLN: 1.0000 | Freq: Once | ORAL | 0 refills | Status: AC

## 2021-04-07 NOTE — Progress Notes (Signed)
Chief Complaint: Diarrhea, abdominal cramping   Referring Provider:     Self   HPI:    Alicia Logan is a 50 y.o. female with a history of bipolar disorder, depression, anxiety, spinal cord stimulator 2017, referred to the [facility] for evaluation of diarrhea.  Symptoms started in February, and worsening lately.  Having up to 10 BMs/day.  Described as loose to watery, nonbloody stools.  Worse in the AM. + Fecal urgency, but no episodes of incontinence or seepage.  No nocturnal stools. Does have lower abdominal cramping.  Loperamide was initially effective, but no longer working. Tried decreasing coffee from 3 cups/day to 1/day.   Tried changing diet- reduced red meat, pork. Worse with poor diet. Occasional nausea; no emesis.  No preceding changes in medications, sick contacts, travel, etc.  Quit smoking Jan; started vape then.  No EtOH or drug use in 4+ years.  No previous EGD or colonoscopy.  Her sister accompanies her to her appointment today.  Family history notable for maternal grandmother with colon cancer in her 30's, paternal grandfather with esophageal cancer.No FHX of IBD, Celiac.   Past Medical History:  Diagnosis Date  . Anxiety   . Bipolar 1 disorder ([place])   . Chronic back pain   . Depression   . Headache      Past Surgical History:  Procedure Laterality Date  . ARTHROSCOPIC REPAIR PCL    . DENTAL SURGERY    . NASAL SEPTUM SURGERY  07/2017  . SPINAL CORD STIMULATOR INSERTION N/A 05/18/2016   Procedure: LUMBAR SPINAL CORD STIMULATOR INSERTION;  Surgeon: Melina Schools, MD;  Location: [facility];  Service: Orthopedics;  Laterality: N/A;   Family History  Problem Relation Age of Onset  . Depression Mother   . Hypertension Father   . Heart failure Father   . Stroke Father   . Colon cancer Maternal Grandmother   . Esophageal cancer Maternal Grandfather    Social History   Tobacco Use  . Smoking status: Former    Packs/day: 0.50     Years: 20.00    Pack years: 10.00    Types: Cigarettes  . Smokeless tobacco: Never  . Tobacco comments:    1/2 pack per day  Vaping Use  . Vaping Use: Every day  Substance Use Topics  . Alcohol use: No    Alcohol/week: 5.0 standard drinks    Types: 5 Cans of beer per week    Comment: 4-5 beers daily  . Drug use: Yes    Types: Marijuana    Comment: occ   Current Outpatient Medications  Medication Sig Dispense Refill  . buPROPion (WELLBUTRIN SR) 150 MG 12 hr tablet Take 150 mg by mouth 2 (two) times daily.    Marland Kitchen gabapentin (NEURONTIN) 300 MG capsule Take 1 capsule (300 mg total) by mouth 3 (three) times daily. 90 capsule 0  . risperiDONE (RISPERDAL) 0.5 MG tablet Take 1 tablet (0.5 mg total) by mouth at bedtime. 30 tablet 0  . ziprasidone (GEODON) 20 MG capsule Take 60 mg by mouth See admin instructions. 20 mg in morning, 40 mg at night     Current Facility-Administered Medications  Medication Dose Route Frequency Provider Last Rate Last Admin  . medroxyPROGESTERone (DEPO-PROVERA) injection 150 mg  150 mg Intramuscular Q90 days Griffin Basil, MD   150 mg at 05/12/20 1100   Allergies  Allergen Reactions  . Sulfa Antibiotics Nausea  And Vomiting and Other (See Comments)    sulfa     Review of Systems: All systems reviewed and negative except where noted in HPI.     Physical Exam:    Wt Readings from Last 3 Encounters:  04/07/21 154 lb 4 oz (70 kg)  06/02/20 162 lb (73.5 kg)  03/12/20 163 lb (73.9 kg)    BP 110/74   Pulse 75   Ht '5\' 9"'  (1.753 m)   Wt 154 lb 4 oz (70 kg)   SpO2 98%   BMI 22.78 kg/m  Constitutional:  Pleasant, in no acute distress. Psychiatric: Normal mood and affect. Behavior is normal. Cardiovascular: Normal rate, regular rhythm. No edema Pulmonary/chest: Effort normal and breath sounds normal. No wheezing, rales or rhonchi. Abdominal: Minimal TTP in lower abdomen.  No rebound or guarding.  No peritoneal signs.  Soft, nondistended. Bowel sounds  active throughout. There are no masses palpable. No hepatomegaly. Neurological: Alert and oriented to person place and time. Skin: Skin is warm and dry. No rashes noted.   ASSESSMENT AND PLAN;   1) Diarrhea 2) Abdominal cramping 3) Fecal urgency 4) Nausea  Discussed the broad DDx for presenting symptoms today, to include IBS, IBD, Microscopic Colitis, Celiac, etc. with plan for the following:  - EGD with biopsies - Colonoscopy with random and directed biopsies - GI PCR, ESR, CRP, CBC, CMP, TSH - tTG, IgA - Continue adequate hydration - Diet diary - Depending on results from the above, can discuss specialized dietary plan vs low FODMAP diet  The indications, risks, and benefits of EGD and colonoscopy were explained to the patient in detail. Risks include but are not limited to bleeding, perforation, adverse reaction to medications, and cardiopulmonary compromise. Sequelae include but are not limited to the possibility of surgery, hospitalization, and mortality. The patient verbalized understanding and wished to proceed. All questions answered, referred to scheduler and bowel prep ordered. Further recommendations pending results of the exam.     [place], DO, FACG  04/07/2021, 3:43 PM   No ref. provider found

## 2021-04-07 NOTE — Patient Instructions (Signed)
If you are age 48 or older, your body mass index should be between 23-30. Your Body mass index is 22.78 kg/m. If this is out of the aforementioned range listed, please consider follow up with your Primary Care Provider.  If you are age 64 or younger, your body mass index should be between 19-25. Your Body mass index is 22.78 kg/m. If this is out of the aformentioned range listed, please consider follow up with your Primary Care Provider.   __________________________________________________________  The [facility] providers would like to encourage you to use [facility] to communicate with providers for non-urgent requests or questions.  Due to long hold times on the telephone, sending your provider a message by [facility] may be a faster and more efficient way to get a response.  Please allow 48 business hours for a response.  Please remember that this is for non-urgent requests.   Please go to the lab on the 2nd floor [address] before you leave the office today.   You have been scheduled for an endoscopy and colonoscopy. Please follow the written instructions given to you at your visit today. Please pick up your prep supplies at the pharmacy within the next 1-3 days. If you use inhalers (even only as needed), please bring them with you on the day of your procedure.  It was a pleasure to see you today!  Vito Cirigliano, D.O.

## 2021-04-08 ENCOUNTER — Other Ambulatory Visit (INDEPENDENT_AMBULATORY_CARE_PROVIDER_SITE_OTHER): Payer: Medicare PPO

## 2021-04-08 DIAGNOSIS — R197 Diarrhea, unspecified: Secondary | ICD-10-CM

## 2021-04-08 DIAGNOSIS — R112 Nausea with vomiting, unspecified: Secondary | ICD-10-CM

## 2021-04-11 ENCOUNTER — Other Ambulatory Visit: Payer: Self-pay

## 2021-04-11 ENCOUNTER — Telehealth: Payer: Self-pay | Admitting: General Surgery

## 2021-04-11 ENCOUNTER — Other Ambulatory Visit: Payer: Medicare PPO

## 2021-04-11 DIAGNOSIS — R197 Diarrhea, unspecified: Secondary | ICD-10-CM

## 2021-04-11 DIAGNOSIS — R112 Nausea with vomiting, unspecified: Secondary | ICD-10-CM

## 2021-04-11 LAB — COMPREHENSIVE METABOLIC PANEL
ALT: 10 IU/L (ref 0–32)
AST: 15 IU/L (ref 0–40)
Albumin/Globulin Ratio: 2.3 — ABNORMAL HIGH (ref 1.2–2.2)
Albumin: 4.8 g/dL (ref 3.8–4.8)
Alkaline Phosphatase: 49 IU/L (ref 44–121)
BUN/Creatinine Ratio: 14 (ref 9–23)
BUN: 13 mg/dL (ref 6–24)
Bilirubin Total: 0.8 mg/dL (ref 0.0–1.2)
CO2: 19 mmol/L — ABNORMAL LOW (ref 20–29)
Calcium: 9.7 mg/dL (ref 8.7–10.2)
Chloride: 106 mmol/L (ref 96–106)
Creatinine, Ser: 0.96 mg/dL (ref 0.57–1.00)
Globulin, Total: 2.1 g/dL (ref 1.5–4.5)
Glucose: 147 mg/dL — ABNORMAL HIGH (ref 65–99)
Potassium: 3.9 mmol/L (ref 3.5–5.2)
Sodium: 141 mmol/L (ref 134–144)
Total Protein: 6.9 g/dL (ref 6.0–8.5)
eGFR: 73 mL/min/{1.73_m2} (ref >59)

## 2021-04-11 LAB — TISSUE TRANSGLUTAMINASE ABS,IGG,IGA
Tissue Transglut Ab: <2 U/mL (ref 0–5)
Transglutaminase IgA: <2 U/mL (ref 0–3)

## 2021-04-11 LAB — CBC
Hematocrit: 39 % (ref 34.0–46.6)
Hemoglobin: 13 g/dL (ref 11.1–15.9)
MCH: 30.5 pg (ref 26.6–33.0)
MCHC: 33.3 g/dL (ref 31.5–35.7)
MCV: 92 fL (ref 79–97)
Platelets: 213 10*3/uL (ref 150–450)
RBC: 4.26 x10E6/uL (ref 3.77–5.28)
RDW: 12.6 % (ref 11.7–15.4)
WBC: 7.4 10*3/uL (ref 3.4–10.8)

## 2021-04-11 LAB — C-REACTIVE PROTEIN: CRP: <1 mg/L (ref 0–10)

## 2021-04-11 LAB — IGA: IgA/Immunoglobulin A, Serum: 161 mg/dL (ref 87–352)

## 2021-04-11 LAB — TSH: TSH: 0.996 u[IU]/mL (ref 0.450–4.500)

## 2021-04-11 LAB — SEDIMENTATION RATE: Sed Rate: 6 mm/hr (ref 0–32)

## 2021-04-11 NOTE — Telephone Encounter (Signed)
Notified the patient we received a PA for clenpiq. Offered the patient a sample and she is coming this morning to the [facility] office to pick it up.

## 2021-04-12 ENCOUNTER — Ambulatory Visit (INDEPENDENT_AMBULATORY_CARE_PROVIDER_SITE_OTHER): Payer: Medicare PPO

## 2021-04-12 DIAGNOSIS — Z3042 Encounter for surveillance of injectable contraceptive: Secondary | ICD-10-CM | POA: Diagnosis not present

## 2021-04-12 MED ORDER — MEDROXYPROGESTERONE ACETATE 150 MG/ML IM SUSP
150.0000 mg | Freq: Once | INTRAMUSCULAR | Status: AC
  Administered 2021-04-12: 150 mg via INTRAMUSCULAR

## 2021-04-12 NOTE — Progress Notes (Signed)
Patient presents for depo injection. Patient is within her window. Injection given in RUOQ. Patient tolerated well. Next depo due 12/6-12/20.

## 2021-04-13 ENCOUNTER — Ambulatory Visit: Payer: Medicare PPO

## 2021-04-14 LAB — GI PROFILE, STOOL, PCR

## 2021-04-20 ENCOUNTER — Other Ambulatory Visit: Payer: Self-pay

## 2021-04-20 ENCOUNTER — Ambulatory Visit (AMBULATORY_SURGERY_CENTER): Payer: Medicare PPO | Admitting: Gastroenterology

## 2021-04-20 ENCOUNTER — Encounter: Payer: Self-pay | Admitting: Gastroenterology

## 2021-04-20 VITALS — BP 123/84 | HR 67 | Temp 98.6°F | Resp 16 | Ht 69.0 in | Wt 154.0 lb

## 2021-04-20 DIAGNOSIS — R194 Change in bowel habit: Secondary | ICD-10-CM

## 2021-04-20 DIAGNOSIS — R197 Diarrhea, unspecified: Secondary | ICD-10-CM

## 2021-04-20 DIAGNOSIS — R110 Nausea: Secondary | ICD-10-CM | POA: Diagnosis not present

## 2021-04-20 DIAGNOSIS — K2970 Gastritis, unspecified, without bleeding: Secondary | ICD-10-CM | POA: Diagnosis not present

## 2021-04-20 DIAGNOSIS — R112 Nausea with vomiting, unspecified: Secondary | ICD-10-CM

## 2021-04-20 DIAGNOSIS — R1084 Generalized abdominal pain: Secondary | ICD-10-CM

## 2021-04-20 DIAGNOSIS — R152 Fecal urgency: Secondary | ICD-10-CM

## 2021-04-20 DIAGNOSIS — R109 Unspecified abdominal pain: Secondary | ICD-10-CM

## 2021-04-20 MED ORDER — SODIUM CHLORIDE 0.9 % IV SOLN: 500.0000 mL | Freq: Once | INTRAVENOUS | Status: DC

## 2021-04-20 MED ORDER — DICYCLOMINE HCL 10 MG PO CAPS: 10.0000 mg | ORAL_CAPSULE | Freq: Three times a day (TID) | ORAL | 3 refills | Status: DC

## 2021-04-20 NOTE — Patient Instructions (Addendum)
Handouts were given to your care partner on Gastritis and FODMAP diet information. Per Dr. Barron Alvine, use over the counter fiber, for example Citrucel, Fibercon, Konsyl or Metamucil. You may resume your current medications today. Await biopsy results.  May take 1-3 weeks to receive pathology results. Repeat screening colonoscopy in 10 years. Please call if any questions or concerns.     YOU HAD AN ENDOSCOPIC PROCEDURE TODAY AT THE [facility]:   Refer to the procedure report that was given to you for any specific questions about what was found during the examination.  If the procedure report does not answer your questions, please call your gastroenterologist to clarify.  If you requested that your care partner not be given the details of your procedure findings, then the procedure report has been included in a sealed envelope for you to review at your convenience later.  YOU SHOULD EXPECT: Some feelings of bloating in the abdomen. Passage of more gas than usual.  Walking can help get rid of the air that was put into your GI tract during the procedure and reduce the bloating. If you had a lower endoscopy (such as a colonoscopy or flexible sigmoidoscopy) you may notice spotting of blood in your stool or on the toilet paper. If you underwent a bowel prep for your procedure, you may not have a normal bowel movement for a few days.  Please Note:  You might notice some irritation and congestion in your nose or some drainage.  This is from the oxygen used during your procedure.  There is no need for concern and it should clear up in a day or so.  SYMPTOMS TO REPORT IMMEDIATELY:  Following lower endoscopy (colonoscopy or flexible sigmoidoscopy):  Excessive amounts of blood in the stool  Significant tenderness or worsening of abdominal pains  Swelling of the abdomen that is new, acute  Fever of 100F or higher  Following upper endoscopy (EGD)  Vomiting of blood or coffee ground  material  New chest pain or pain under the shoulder blades  Painful or persistently difficult swallowing  New shortness of breath  Fever of 100F or higher  Black, tarry-looking stools  For urgent or emergent issues, a gastroenterologist can be reached at any hour by calling [phone]. Do not use MyChart messaging for urgent concerns.    DIET:  We do recommend a small meal at first, but then you may proceed to your regular diet.  Drink plenty of fluids but you should avoid alcoholic beverages for 24 hours.  ACTIVITY:  You should plan to take it easy for the rest of today and you should NOT DRIVE or use heavy machinery until tomorrow (because of the sedation medicines used during the test).    FOLLOW UP: Our staff will call the number listed on your records 48-72 hours following your procedure to check on you and address any questions or concerns that you may have regarding the information given to you following your procedure. If we do not reach you, we will leave a message.  We will attempt to reach you two times.  During this call, we will ask if you have developed any symptoms of COVID 19. If you develop any symptoms (ie: fever, flu-like symptoms, shortness of breath, cough etc.) before then, please call [phone].  If you test positive for Covid 19 in the 2 weeks post procedure, please call and report this information to [place].    If any biopsies were taken you will be contacted  by phone or by letter within the next 1-3 weeks.  Please call us at [phone] if you have not heard about the biopsies in 3 weeks.    SIGNATURES/CONFIDENTIALITY: You and/or your care partner have signed paperwork which will be entered into your electronic medical record.  These signatures attest to the fact that that the information above on your After Visit Summary has been reviewed and is understood.  Full responsibility of the confidentiality of this discharge information lies with you and/or your  care-partner.

## 2021-04-20 NOTE — Op Note (Signed)
[facility] Patient Name: Alicia Logan Procedure Date: 04/20/2021 1:48 PM MRN: [id number] Endoscopist: Doristine Locks , MD Age: 50 Referring MD:  Date of Birth: 11/13/1970 Gender: Female Account #: [id number] Procedure:                Upper GI endoscopy Indications:              Generalized abdominal pain, Abdominal cramping,                            Diarrhea, Nausea Medicines:                Monitored Anesthesia Care Procedure:                Pre-Anesthesia Assessment:                           - Prior to the procedure, a History and Physical                            was performed, and patient medications and                            allergies were reviewed. The patient's tolerance of                            previous anesthesia was also reviewed. The risks                            and benefits of the procedure and the sedation                            options and risks were discussed with the patient.                            All questions were answered, and informed consent                            was obtained. Prior Anticoagulants: The patient has                            taken no previous anticoagulant or antiplatelet                            agents. ASA Grade Assessment: II - A patient with                            mild systemic disease. After reviewing the risks                            and benefits, the patient was deemed in                            satisfactory condition to undergo the procedure.  After obtaining informed consent, the endoscope was                            passed under direct vision. Throughout the                            procedure, the patient's blood pressure, pulse, and                            oxygen saturations were monitored continuously. The                            GIF HQ190 #[id number] was introduced through the                            mouth, and advanced to the second part of  duodenum.                            The upper GI endoscopy was accomplished without                            difficulty. The patient tolerated the procedure                            well. Scope In: Scope Out: Findings:                 The examined esophagus was normal.                           Minimal inflammation characterized by erythema was                            found in the gastric fundus, in the gastric body                            and in the gastric antrum. Biopsies were taken with                            a cold forceps for Helicobacter pylori testing.                            Estimated blood loss was minimal.                           The gastroesophageal flap valve was visualized                            endoscopically and classified as Hill Grade III                            (minimal fold, loose to endoscope, hiatal hernia                            likely).  The examined duodenum was normal. Biopsies were                            taken with a cold forceps for histology. Estimated                            blood loss was minimal. Complications:            No immediate complications. Estimated Blood Loss:     Estimated blood loss was minimal. Impression:               - Normal esophagus.                           - Gastritis. Biopsied.                           - Gastroesophageal flap valve classified as Hill                            Grade III (minimal fold, loose to endoscope, hiatal                            hernia likely).                           - Normal examined duodenum. Biopsied. Recommendation:           - Patient has a contact number available for                            emergencies. The signs and symptoms of potential                            delayed complications were discussed with the                            patient. Return to normal activities tomorrow.                            Written discharge  instructions were provided to the                            patient.                           - Resume previous diet.                           - Continue present medications.                           - Await pathology results.                           - Perform a colonoscopy today. Doristine Locks, MD 04/20/2021 2:21:49 PM

## 2021-04-20 NOTE — Progress Notes (Signed)
Pt Drowsy. VSS. To PACU, report to RN. No anesthetic complications noted.  

## 2021-04-20 NOTE — Op Note (Signed)
[facility] Patient Name: Alicia Logan Procedure Date: 04/20/2021 1:48 PM MRN: [id number] Endoscopist: Gerrit Heck , MD Age: 50 Referring MD:  Date of Birth: Jul 07, 1971 Gender: Female Account #: [id number] Procedure:                Colonoscopy Indications:              Generalized abdominal pain, Abdominal cramping,                            Change in bowel habits, Diarrhea                           Normal/negative GI PCR panel, tTG, IgA, CBC, ESR,                            CRP, TSH, CMP. Medicines:                Monitored Anesthesia Care Procedure:                Pre-Anesthesia Assessment:                           - Prior to the procedure, a History and Physical                            was performed, and patient medications and                            allergies were reviewed. The patient's tolerance of                            previous anesthesia was also reviewed. The risks                            and benefits of the procedure and the sedation                            options and risks were discussed with the patient.                            All questions were answered, and informed consent                            was obtained. Prior Anticoagulants: The patient has                            taken no previous anticoagulant or antiplatelet                            agents. ASA Grade Assessment: II - A patient with                            mild systemic disease. After reviewing the risks  and benefits, the patient was deemed in                            satisfactory condition to undergo the procedure.                           After obtaining informed consent, the colonoscope                            was passed under direct vision. Throughout the                            procedure, the patient's blood pressure, pulse, and                            oxygen saturations were monitored continuously. The                             Olympus CF-[id number] [id number]) Colonoscope was                            introduced through the anus and advanced to the the                            terminal ileum. The colonoscopy was performed                            without difficulty. The patient tolerated the                            procedure well. The quality of the bowel                            preparation was good. The terminal ileum, ileocecal                            valve, appendiceal orifice, and rectum were                            photographed. Scope In: 2:04:23 PM Scope Out: 2:17:05 PM Scope Withdrawal Time: 0 hours 9 minutes 55 seconds  Total Procedure Duration: 0 hours 12 minutes 42 seconds  Findings:                 The perianal and digital rectal examinations were                            normal.                           The colon appeared normal throughout. Biopsies for                            histology were taken with a cold forceps from the  right colon and left colon for evaluation of                            microscopic colitis. Estimated blood loss was                            minimal.                           The retroflexed view of the distal rectum and anal                            verge was normal and showed no anal or rectal                            abnormalities.                           The terminal ileum appeared normal. Complications:            No immediate complications. Estimated Blood Loss:     Estimated blood loss was minimal. Impression:               - The entire examined colon is normal. Biopsied.                           - The distal rectum and anal verge are normal on                            retroflexion view.                           - The examined portion of the ileum was normal. Recommendation:           - Patient has a contact number available for                            emergencies. The signs and symptoms of  potential                            delayed complications were discussed with the                            patient. Return to normal activities tomorrow.                            Written discharge instructions were provided to the                            patient.                           - Resume previous diet.                           - Continue present medications.                           -  Await pathology results.                           - Repeat colonoscopy in 10 years for screening                            purposes.                           - Return to GI office PRN.                           - Use fiber, for example Citrucel, Fibercon, Konsyl                            or Metamucil.                           - Trial low FODMAP diet. Gerrit Heck, MD 04/20/2021 2:29:31 PM

## 2021-04-20 NOTE — Progress Notes (Signed)
[facility]   GASTROENTEROLOGY PROCEDURE H&P NOTE   Primary Care Physician: Patient, No Pcp Per (Inactive)    Reason for Procedure:   Diarrhea, abdominal cramping, nausea  Plan:    EDG and colonoscopy  Patient is appropriate for endoscopic procedure(s) in the ambulatory ([facility]) setting.  The nature of the procedure, as well as the risks, benefits, and alternatives were carefully and thoroughly reviewed with the patient. Ample time for discussion and questions allowed. The patient understood, was satisfied, and agreed to proceed.     HPI: Alicia Logan is a 50 y.o. female who presents for EGD and colonoscopy for evaluation of diarrhea, fecal urgency, abdominal cramping, and nausea.  Patient was most recently seen in the Gastroenterology Clinic on 04/07/2021 by me.    Normal/negative GI PCR panel, tTG, IgA, CBC, ESR, CRP, TSH, CMP. Otherwise, no interval change in medical history since that appointment. Please refer to that note for full details regarding GI history and clinical presentation.   Past Medical History:  Diagnosis Date   Anxiety    Bipolar 1 disorder ([place])    Chronic back pain    Depression    Headache     Past Surgical History:  Procedure Laterality Date   ARTHROSCOPIC REPAIR PCL     DENTAL SURGERY     NASAL SEPTUM SURGERY  07/2017   SPINAL CORD STIMULATOR INSERTION N/A 05/18/2016   Procedure: LUMBAR SPINAL CORD STIMULATOR INSERTION;  Surgeon: Melina Schools, MD;  Location: [place];  Service: Orthopedics;  Laterality: N/A;    Prior to Admission medications   Medication Sig Start Date End Date Taking? Authorizing Provider  buPROPion (WELLBUTRIN SR) 150 MG 12 hr tablet Take 150 mg by mouth 2 (two) times daily.   Yes [provider]  gabapentin (NEURONTIN) 300 MG capsule Take 1 capsule (300 mg total) by mouth 3 (three) times daily. 07/04/16  Yes Kerrie Buffalo, NP  risperiDONE (RISPERDAL) 0.5 MG tablet Take 1 tablet (0.5 mg total) by mouth at bedtime. 07/04/16   Yes Kerrie Buffalo, NP  ziprasidone (GEODON) 20 MG capsule Take 60 mg by mouth See admin instructions. 20 mg in morning, 40 mg at night   Yes [provider]  diphenhydrAMINE (BENADRYL ALLERGY) 25 MG tablet 1 tablet at bedtime as needed    [provider]  EPINEPHrine (EPIPEN 2-PAK) 0.3 mg/0.3 mL IJ SOAJ injection See admin instructions.    [provider]  QUEtiapine (SEROQUEL) 25 MG tablet     [provider]    Current Outpatient Medications  Medication Sig Dispense Refill   buPROPion (WELLBUTRIN SR) 150 MG 12 hr tablet Take 150 mg by mouth 2 (two) times daily.     gabapentin (NEURONTIN) 300 MG capsule Take 1 capsule (300 mg total) by mouth 3 (three) times daily. 90 capsule 0   risperiDONE (RISPERDAL) 0.5 MG tablet Take 1 tablet (0.5 mg total) by mouth at bedtime. 30 tablet 0   ziprasidone (GEODON) 20 MG capsule Take 60 mg by mouth See admin instructions. 20 mg in morning, 40 mg at night     diphenhydrAMINE (BENADRYL ALLERGY) 25 MG tablet 1 tablet at bedtime as needed     EPINEPHrine (EPIPEN 2-PAK) 0.3 mg/0.3 mL IJ SOAJ injection See admin instructions.     QUEtiapine (SEROQUEL) 25 MG tablet      Current Facility-Administered Medications  Medication Dose Route Frequency Provider Last Rate Last Admin   0.9 %  sodium chloride infusion  500 mL Intravenous Once Marguetta Windish,  Hanan Mcwilliams V, DO       medroxyPROGESTERone (DEPO-PROVERA) injection 150 mg  150 mg Intramuscular Q90 days Griffin Basil, MD   150 mg at 05/12/20 1100    Allergies as of 04/20/2021 - Review Complete 04/20/2021  Allergen Reaction Noted   Montelukast Other (See Comments) 10/07/2018   Sulfa antibiotics Nausea And Vomiting and Other (See Comments) 11/26/2011   Neomycin Rash 02/08/2021    Family History  Problem Relation Age of Onset   Depression Mother    Hypertension Father    Heart failure Father    Stroke Father    Colon cancer Maternal Grandmother    Esophageal cancer Maternal  Grandfather     Social History   Socioeconomic History   Marital status: Single    Spouse name: Not on file   Number of children: Not on file   Years of education: Not on file   Highest education level: Not on file  Occupational History   Occupation: IT consultant  Tobacco Use   Smoking status: Former    Packs/day: 0.50    Years: 20.00    Pack years: 10.00    Types: Cigarettes   Smokeless tobacco: Never   Tobacco comments:    1/2 pack per day  Vaping Use   Vaping Use: Every day  Substance and Sexual Activity   Alcohol use: No    Alcohol/week: 5.0 standard drinks    Types: 5 Cans of beer per week    Comment: 4-5 beers daily   Drug use: Yes    Types: Marijuana    Comment: occ   Sexual activity: Not Currently    Birth control/protection: Injection, Abstinence  Other Topics Concern   Not on file  Social History Narrative   Not on file   Social Determinants of Health   Financial Resource Strain: Not on file  Food Insecurity: Not on file  Transportation Needs: Not on file  Physical Activity: Not on file  Stress: Not on file  Social Connections: Not on file  Intimate Partner Violence: Not on file    Physical Exam: Vital signs in last 24 hours: _0  (!) 119/59   Pulse 81   Temp 98.6 F (37 C)   Ht _1  (1.753 m)   Wt 154 lb (69.9 kg)   SpO2 99%   BMI 22.74 kg/m  GEN: NAD EYE: Sclerae anicteric ENT: MMM CV: Non-tachycardic Pulm: CTA b/l GI: Soft, NT/ND NEURO:  Alert & Oriented x 3   Gerrit Heck, DO [facility]   04/20/2021 1:47 PM

## 2021-04-20 NOTE — Progress Notes (Signed)
Called to room to assist during endoscopic procedure.  Patient ID and intended procedure confirmed with present staff. Received instructions for my participation in the procedure from the performing physician.  

## 2021-04-20 NOTE — Progress Notes (Signed)
No problems noted in the recovery room. maw 

## 2021-04-22 ENCOUNTER — Telehealth: Payer: Self-pay | Admitting: *Deleted

## 2021-04-22 NOTE — Telephone Encounter (Signed)
  Follow up Call-  Call back number 04/20/2021  Post procedure Call Back phone  # [phone]  Permission to leave phone message Yes  Some recent data might be hidden     Patient questions:  Do you have a fever, pain , or abdominal swelling? No. Pain Score  0 *  Have you tolerated food without any problems? Yes.    Have you been able to return to your normal activities? Yes.    Do you have any questions about your discharge instructions: Diet   No. Medications  No. Follow up visit  No.  Do you have questions or concerns about your Care? No.  Actions: * If pain score is 4 or above: No action needed, pain <4.  Have you developed a fever since your procedure? no  2.   Have you had an respiratory symptoms (SOB or cough) since your procedure? no  3.   Have you tested positive for COVID 19 since your procedure no  4.   Have you had any family members/close contacts diagnosed with the COVID 19 since your procedure?  no   If yes to any of these questions please route to Laverna Peace, RN and Karlton Lemon, RN

## 2021-04-27 ENCOUNTER — Encounter: Payer: Self-pay | Admitting: Gastroenterology

## 2021-05-19 NOTE — Telephone Encounter (Signed)
Glad to hear she is doing better after making that dietary modification.  She does not necessarily need a follow-up appointment, but rather can follow-up on a prn basis.  Thank you.

## 2021-06-15 MED ORDER — DIPHENOXYLATE-ATROPINE 2.5-0.025 MG PO TABS: ORAL_TABLET | ORAL | 1 refills | Status: DC

## 2021-06-15 NOTE — Telephone Encounter (Signed)
So far the evaluation has been unrevealing to include upper endoscopy with gastric/duodenal biopsies and colonoscopy with biopsies, along with normal/negative GI PCR panel, TTG, TSH, ESR, CRP, CBC, CMP.  Will plan for the following: - Start trial of Lomotil.  Can start at 4 times/day until symptoms improving, then decrease dose frequency. - If not already started, start fiber supplement for stool bulking effect - If not already doing, start low FODMAP diet - Depending on response to Lomotil, may consider trial of cholestyramine vs Viberzi vs Rifaximin.  We will discuss this medication at follow-up

## 2021-06-15 NOTE — Telephone Encounter (Signed)
Prescription for Lomotil has been called into [facility] on file. I gave Toni Amend the verbal order for the prescription. Patient notified of recommendations via my chart.

## 2021-06-20 NOTE — Telephone Encounter (Signed)
I have not seen weight gain with Lomotil, and this medication is only planned for short term use. If requiring prolonged use, we will plan to switch to another agent.

## 2021-07-04 ENCOUNTER — Ambulatory Visit (INDEPENDENT_AMBULATORY_CARE_PROVIDER_SITE_OTHER): Payer: Medicare PPO | Admitting: *Deleted

## 2021-07-04 ENCOUNTER — Ambulatory Visit: Payer: Medicare PPO

## 2021-07-04 ENCOUNTER — Other Ambulatory Visit: Payer: Self-pay

## 2021-07-04 DIAGNOSIS — Z3042 Encounter for surveillance of injectable contraceptive: Secondary | ICD-10-CM

## 2021-07-04 NOTE — Progress Notes (Signed)
Subjective: Alicia Logan presents today for Depo injection.     OBJECTIVE: Appears well, in no apparent distress.   I have reviewed the patient's allergy and medications.   ASSESSMENT: Depo injection given   PLAN: Next Depo due 2/27-3/13/23 Due for AEX.   Administrations This Visit     medroxyPROGESTERone (DEPO-PROVERA) injection 150 mg     Admin Date 07/04/2021 Action Given Dose 150 mg Route Intramuscular Administered By Lanney Gins, CMA

## 2021-07-04 NOTE — Progress Notes (Signed)
Patient was assessed and managed by nursing staff during this encounter. I have reviewed the chart and agree with the documentation and plan. I have also made any necessary editorial changes.  Catalina Antigua, MD 07/04/2021 4:42 PM

## 2021-07-05 NOTE — Telephone Encounter (Signed)
This does have some benefit, but was really looking to start a Probiotic (ie, Align). This is a Prebiotic, but does contain fiber which is nice. This appears to have 5 gm fiber/serving. Goal is about 25-30 gm/day. Typical fiber supplements are 7-10 gm/serving.

## 2021-07-19 ENCOUNTER — Encounter: Payer: Self-pay | Admitting: Gastroenterology

## 2021-07-19 ENCOUNTER — Ambulatory Visit (INDEPENDENT_AMBULATORY_CARE_PROVIDER_SITE_OTHER): Payer: Medicare PPO | Admitting: Gastroenterology

## 2021-07-19 ENCOUNTER — Other Ambulatory Visit: Payer: Self-pay

## 2021-07-19 VITALS — BP 120/80 | HR 90 | Ht 69.0 in | Wt 151.2 lb

## 2021-07-19 DIAGNOSIS — K580 Irritable bowel syndrome with diarrhea: Secondary | ICD-10-CM | POA: Diagnosis not present

## 2021-07-19 NOTE — Patient Instructions (Addendum)
If you are age 48 or older, your body mass index should be between 23-30. Your Body mass index is 22.34 kg/m. If this is out of the aforementioned range listed, please consider follow up with your Primary Care Provider.  If you are age 17 or younger, your body mass index should be between 19-25. Your Body mass index is 22.34 kg/m. If this is out of the aformentioned range listed, please consider follow up with your Primary Care Provider.   ________________________________________________________  The [facility] providers would like to encourage you to use [facility] to communicate with providers for non-urgent requests or questions.  Due to long hold times on the telephone, sending your provider a message by [facility] may be a faster and more efficient way to get a response.  Please allow 48 business hours for a response.  Please remember that this is for non-urgent requests.  _______________________________________________________  Please purchase the following medications over the counter and take as directed: Benefiber or Citrucel  Please call in 3 months to schedule a follow up appointment.  It was a pleasure to see you today!  Vito Cirigliano, D.O.   Low-FODMAP Eating Plan FODMAP stands for fermentable oligosaccharides, disaccharides, monosaccharides, and polyols. These are sugars that are hard for some people to digest. A low-FODMAP eating plan may help some people who have irritable bowel syndrome (IBS) and certain other bowel (intestinal) diseases to manage their symptoms. This meal plan can be complicated to follow. Work with a diet and nutrition specialist (dietitian) to make a low-FODMAP eating plan that is right for you. A dietitian can help make sure that you get enough nutrition from this diet. What are tips for following this plan? Reading food labels Check labels for hidden FODMAPs such as: High-fructose syrup. Honey. Agave. Natural fruit flavors. Onion or garlic  powder. Choose low-FODMAP foods that contain 3-4 grams of fiber per serving. Check food labels for serving sizes. Eat only one serving at a time to make sure FODMAP levels stay low. Shopping Shop with a list of foods that are recommended on this diet and make a meal plan. Meal planning Follow a low-FODMAP eating plan for up to 6 weeks, or as told by your health care provider or dietitian. To follow the eating plan: Eliminate high-FODMAP foods from your diet completely. Choose only low-FODMAP foods to eat. You will do this for 2-6 weeks. Gradually reintroduce high-FODMAP foods into your diet one at a time. Most people should wait a few days before introducing the next new high-FODMAP food into their meal plan. Your dietitian can recommend how quickly you may reintroduce foods. Keep a daily record of what and how much you eat and drink. Make note of any symptoms that you have after eating. Review your daily record with a dietitian regularly to identify which foods you can eat and which foods you should avoid. General tips Drink enough fluid each day to keep your urine pale yellow. Avoid processed foods. These often have added sugar and may be high in FODMAPs. Avoid most dairy products, whole grains, and sweeteners. Work with a dietitian to make sure you get enough fiber in your diet. Avoid high FODMAP foods at meals to manage symptoms. Recommended foods Fruits Bananas, oranges, tangerines, lemons, limes, blueberries, raspberries, strawberries, grapes, cantaloupe, honeydew melon, kiwi, papaya, passion fruit, and pineapple. Limited amounts of dried cranberries, banana chips, and shredded coconut. Vegetables Eggplant, zucchini, cucumber, peppers, green beans, bean sprouts, lettuce, arugula, kale, Swiss chard, spinach, collard greens, bok  choy, summer squash, potato, and tomato. Limited amounts of corn, carrot, and sweet potato. Green parts of scallions. Grains Gluten-free grains, such as rice,  oats, buckwheat, quinoa, corn, polenta, and millet. Gluten-free pasta, bread, or cereal. Rice noodles. Corn tortillas. Meats and other proteins Unseasoned beef, pork, poultry, or fish. Eggs. Tomasa Blase. Tofu (firm) and tempeh. Limited amounts of nuts and seeds, such as almonds, walnuts, [place] nuts, pecans, peanuts, nut butters, pumpkin seeds, chia seeds, and sunflower seeds. Dairy Lactose-free milk, yogurt, and kefir. Lactose-free cottage cheese and ice cream. Non-dairy milks, such as almond, coconut, hemp, and rice milk. Non-dairy yogurt. Limited amounts of goat cheese, brie, mozzarella, parmesan, swiss, and other hard cheeses. Fats and oils Butter-free spreads. Vegetable oils, such as olive, canola, and sunflower oil. Seasoning and other foods Artificial sweeteners with names that do not end in "ol," such as aspartame, saccharine, and stevia. Maple syrup, white table sugar, raw sugar, brown sugar, and molasses. Mayonnaise, soy sauce, and tamari. Fresh basil, coriander, parsley, rosemary, and thyme. Beverages Water and mineral water. Sugar-sweetened soft drinks. Small amounts of orange juice or cranberry juice. Black and green tea. Most dry wines. Coffee. The items listed above may not be a complete list of foods and beverages you can eat. Contact a dietitian for more information. Foods to avoid Fruits Fresh, dried, and juiced forms of apple, pear, watermelon, peach, plum, cherries, apricots, blackberries, boysenberries, figs, nectarines, and mango. Avocado. Vegetables Chicory root, artichoke, asparagus, cabbage, snow peas, Brussels sprouts, broccoli, sugar snap peas, mushrooms, celery, and cauliflower. Onions, garlic, leeks, and the white part of scallions. Grains Wheat, including kamut, durum, and semolina. Barley and bulgur. Couscous. Wheat-based cereals. Wheat noodles, bread, crackers, and pastries. Meats and other proteins Fried or fatty meat. Sausage. Cashews and pistachios. Soybeans, baked  beans, black beans, chickpeas, kidney beans, fava beans, navy beans, lentils, black-eyed peas, and split peas. Dairy Milk, yogurt, ice cream, and soft cheese. Cream and sour cream. Milk-based sauces. Custard. Buttermilk. Soy milk. Seasoning and other foods Any sugar-free gum or candy. Foods that contain artificial sweeteners such as sorbitol, mannitol, isomalt, or xylitol. Foods that contain honey, high-fructose corn syrup, or agave. Bouillon, vegetable stock, beef stock, and chicken stock. Garlic and onion powder. Condiments made with onion, such as hummus, chutney, pickles, relish, salad dressing, and salsa. Tomato paste. Beverages Chicory-based drinks. Coffee substitutes. Chamomile tea. Fennel tea. Sweet or fortified wines such as port or sherry. Diet soft drinks made with isomalt, mannitol, maltitol, sorbitol, or xylitol. Apple, pear, and mango juice. Juices with high-fructose corn syrup. The items listed above may not be a complete list of foods and beverages you should avoid. Contact a dietitian for more information. Summary FODMAP stands for fermentable oligosaccharides, disaccharides, monosaccharides, and polyols. These are sugars that are hard for some people to digest. A low-FODMAP eating plan is a short-term diet that helps to ease symptoms of certain bowel diseases. The eating plan usually lasts up to 6 weeks. After that, high-FODMAP foods are reintroduced gradually and one at a time. This can help you find out which foods may be causing symptoms. A low-FODMAP eating plan can be complicated. It is best to work with a dietitian who has experience with this type of plan. This information is not intended to replace advice given to you by your health care provider. Make sure you discuss any questions you have with your health care provider. Document Revised: 11/27/2019 Document Reviewed: 11/27/2019 Elsevier Patient Education  2022 [employer] Inc.   [employer] Content  in Foods Fiber is a  substance that is found in plant foods, such as fruits, vegetables, whole grains, nuts, seeds, and beans. As part of your treatment and recovery plan, your health care provider may recommend that you eat foods that have specific amounts of dietary fiber. Some conditions may require a high-fiber diet while others may require a low-fiber diet. This sheet gives you information about the dietary fiber content of some common foods. Your health care provider will tell you how much fiber you need in your diet. If you have problems or questions, contact your health care provider or dietitian. What foods are high in fiber? Fruits Blackberries or raspberries (fresh) --  cup (75 g) has 4 g of fiber. Pear (fresh) -- 1 medium (180 g) has 5.5 g of fiber. Prunes (dried) -- 6 to 8 pieces (57-76 g) has 5 g of fiber. Apple with skin -- 1 medium (182 g) has 4.8 g of fiber. Guava -- 1 cup (128 g) has 8.9 g of fiber. Vegetables Peas (frozen) --  cup (80 g) has 4.4 g of fiber. Potato with skin (baked) -- 1 medium (173 g) has 4.4 g of fiber. Pumpkin (canned) --  cup (122 g) has 5 g of fiber. Brussels sprouts (cooked) --  cup (78 g) has 4 g of fiber. Sweet potato --  cup mashed (124 g) has 4 g of fiber. Winter squash -- 1 cup cooked (205 g) has 5.7 g of fiber. Grains Bran cereal --  cup (31 g) has 8.6 g of fiber. Bulgur (cooked) --  cup (70 g) has 4 g of fiber. Quinoa (cooked) -- 1 cup (185 g) has 5.2 g of fiber. Popcorn -- 3 cups (375 g) popped has 5.8 g of fiber. Spaghetti, whole wheat -- 1 cup (140 g) has 6 g of fiber. Meats and other proteins Pinto beans (cooked) --  cup (90 g) has 7.7 g of fiber. Lentils (cooked) --  cup (90 g) has 7.8 g of fiber. Kidney beans (canned) --  cup (92.5 g) has 5.7 g of fiber. Soybeans (canned, frozen, or fresh) --  cup (92.5 g) has 5.2 g of fiber. Baked beans, plain or vegetarian (canned) --  cup (130 g) has 5.2 g of fiber. Garbanzo beans or chickpeas (canned) --   cup (90 g) has 6.6 g of fiber. Black beans (cooked) --  cup (86 g) has 7.5 g of fiber. White beans or navy beans (cooked) --  cup (91 g) has 9.3 g of fiber. The items listed above may not be a complete list of foods with high fiber. Actual amounts of fiber may be different depending on processing. Contact a dietitian for more information. What foods are moderate in fiber? Fruits Banana -- 1 medium (126 g) has 3.2 g of fiber. Melon -- 1 cup (155 g) has 1.4 g of fiber. Orange -- 1 small (154 g) has 3.7 g of fiber. Raisins --  cup (40 g) has 1.8 g of fiber. Applesauce, sweetened --  cup (125 g) has 1.5 g of fiber. Blueberries (fresh) --  cup (75 g) has 1.8 g of fiber. Strawberries (fresh, sliced) -- 1 cup (150 g) has 3 g of fiber. Cherries -- 1 cup (140 g) has 2.9 g of fiber. Vegetables Broccoli (cooked) --  cup (77.5 g) has 2.1 g of fiber. Carrots (cooked) --  cup (77.5 g) has 2.2 g of fiber. Corn (canned or frozen) --  cup (82.5 g) has 2.1 g  of fiber. Potatoes, mashed --  cup (105 g) has 1.6 g of fiber. Tomato -- 1 medium (62 g) has 1.5 g of fiber. Green beans (canned) --  cup (83 g) has 2 g of fiber. Squash, winter --  cup (58 g) has 1 g of fiber. Sweet potato, baked -- 1 medium (150 g) has 3 g of fiber. Cauliflower (cooked) -- 1/2 cup (90 g) has 2.3 g of fiber. Grains Long-grain brown rice (cooked) -- 1 cup (196 g) has 3.5 g of fiber. Bagel, plain -- one 4-inch (10 cm) bagel has 2 g of fiber. Instant oatmeal --  cup (120 g) has about 2 g of fiber. Macaroni noodles, enriched (cooked) -- 1 cup (140 g) has 2.5 g of fiber. Multigrain cereal --  cup (15 g) has about 2-4 g of fiber. Whole-wheat bread -- 1 slice (26 g) has 2 g of fiber. Whole-wheat spaghetti noodles --  cup (70 g) has 3.2 g of fiber. Corn tortilla -- one 6-inch (15 cm) tortilla has 1.5 g of fiber. Meats and other proteins Almonds --  cup or 1 oz (28 g) has 3.5 g of fiber. Sunflower seeds in shell --  cup  or  oz (11.5 g) has 1.1 g of fiber. Vegetable or soy patty -- 1 patty (70 g) has 3.4 g of fiber. Walnuts --  cup or 1 oz (30 g) has 2 g of fiber. Flax seed -- 1 Tbsp (7 g) has 2.8 g of fiber. The items listed above may not be a complete list of foods that have moderate amounts of fiber. Actual amounts of fiber may be different depending on processing. Contact a dietitian for more information. What foods are low in fiber? Low-fiber foods contain less than 1 g of fiber per serving. They include: Fruits Fruit juice --  cup or 4 fl oz (118 mL) has 0.5 g of fiber. Vegetables Lettuce -- 1 cup (35 g) has 0.5 g of fiber. Cucumber (slices) --  cup (60 g) has 0.3 g of fiber. Celery -- 1 stalk (40 g) has 0.1 g of fiber. Grains Flour tortilla -- one 6-inch (15 cm) tortilla has 0.5 g of fiber. White rice (cooked) --  cup (81.5 g) has 0.3 g of fiber. Meats and other proteins Egg -- 1 large (50 g) has 0 g of fiber. Meat, poultry, or fish -- 3 oz (85 g) has 0 g of fiber. Dairy Milk -- 1 cup or 8 fl oz (237 mL) has 0 g of fiber. Yogurt -- 1 cup (245 g) has 0 g of fiber. The items listed above may not be a complete list of foods that are low in fiber. Actual amounts of fiber may be different depending on processing. Contact a dietitian for more information. Summary Fiber is a substance that is found in plant foods, such as fruits, vegetables, whole grains, nuts, seeds, and beans. As part of your treatment and recovery plan, your health care provider may recommend that you eat foods that have specific amounts of dietary fiber. This information is not intended to replace advice given to you by your health care provider. Make sure you discuss any questions you have with your health care provider. Document Revised: 11/13/2019 Document Reviewed: 11/13/2019 Elsevier Patient Education  2022 [employer].

## 2021-07-19 NOTE — Progress Notes (Signed)
Chief Complaint:    Diarrhea, procedure follow-up  GI History: 50 y.o. female with a history of bipolar disorder, depression, anxiety, spinal cord stimulator 2017, initially seen in the GI clinic in 03/2021 for evaluation of diarrhea x7 months.  Was having up to 10 BMs/day described as loose to watery, nonbloody stools.  Worse in the AM.  No nocturnal stools.  Does have associated lower abdominal cramping. - Quit smoking in January; started to [place] changing diet-reduced red meat, pork with some improvement - Loperamide initially effective but eventually lost efficacy - Decreased coffee from 3 cups/day to 1 cup/day - 03/2021: Normal CBC, CMP (BG 147), ESR, CRP, tTG IgA, total IgA, TSH, GI PCR panel - 03/2021: EGD/colonoscopy essentially normal with normal gastric, duodenal, and colonic biopsies  Family history notable for maternal grandmother with colon cancer in her 37's, paternal grandfather with esophageal cancer.No FHX of IBD, Celiac.  Endoscopic History: - EGD (03/2021): Minimal non-H. pylori gastritis, otherwise normal.  Duodenal biopsies normal - Colonoscopy (03/2021): Normal colon.  Biopsies negative for IBD, MC.  Normal TI.  Repeat in 10 years.  HPI:     Patient is a 50 y.o. female presenting to the Gastroenterology Clinic for follow-up.  Initially seen in 03/2021 for evaluation of diarrhea and abdominal cramping.  Labs and stool studies unrevealing and normal EGD/colonoscopy as outlined above.  Was started on probiotic earlier this month.  Was prescribed Lomotil.  Today, she states she continues to have diarrhea. Started taking Align last week with some improvement in stool consistency. Started low FODMAP diet but still with loose stools. Taking up to 5 Lomotil/day.  Avoiding food due to fear of postprandial diarrhea with subsequent 10#weight loss over the last month or so.  Continued to exercise, yoga, Pilates.  Symptoms worse around the holiday season and with reported recent  increase in anxiety/stress.  The office with her sister, Stanton Kidney, who is an Therapist, sports across the hall into the [employer]  Review of systems:     No chest pain, no SOB, no fevers, no urinary sx   Past Medical History:  Diagnosis Date   Anxiety    Bipolar 1 disorder (HCC)    Chronic back pain    Depression    Headache     Patient's surgical history, family medical history, social history, medications and allergies were all reviewed in Epic    Current Outpatient Medications  Medication Sig Dispense Refill   buPROPion (WELLBUTRIN SR) 150 MG 12 hr tablet Take 150 mg by mouth 2 (two) times daily.     diphenhydrAMINE (BENADRYL) 25 MG tablet 1 tablet at bedtime as needed     diphenoxylate-atropine (LOMOTIL) 2.5-0.025 MG tablet Take 1-2 tablets by mouth 4 times a day as needed, then decrease frequency 120 tablet 1   EPINEPHrine 0.3 mg/0.3 mL IJ SOAJ injection See admin instructions.     gabapentin (NEURONTIN) 300 MG capsule Take 1 capsule (300 mg total) by mouth 3 (three) times daily. 90 capsule 0   QUEtiapine (SEROQUEL) 25 MG tablet      risperiDONE (RISPERDAL) 0.5 MG tablet Take 1 tablet (0.5 mg total) by mouth at bedtime. 30 tablet 0   ziprasidone (GEODON) 20 MG capsule Take 60 mg by mouth See admin instructions. 20 mg in morning, 40 mg at night     Current Facility-Administered Medications  Medication Dose Route Frequency Provider Last Rate Last Admin   medroxyPROGESTERone (DEPO-PROVERA) injection 150 mg  150 mg Intramuscular Q90 days Elgie Congo,  Carolann Littler, MD   150 mg at 07/04/21 1515    Physical Exam:     There were no vitals taken for this visit.  GENERAL:  Pleasant female in NAD PSYCH: : Cooperative, normal affect Musculoskeletal:  Normal muscle tone, normal strength NEURO: Alert and oriented x 3, no focal neurologic deficits   IMPRESSION and PLAN:    1) IBS-D Clinical presentation consistent with IBS-D.  Discussed pathophysiology at length today.  Exacerbation of symptoms with  worsening anxiety and underlying bipolar and anxiety/depression. - EGD/colonoscopy unrevealing.  Labs and stool study otherwise unremarkable - Continue low FODMAP diet. Discussed diet and provided with handout with detailed instructions today - Start fiber supplement (i.e. Citrucel or Benefiber) with goal 25-30 g of fiber/day - Continue probiotic - Continue regular exercise and yoga given demonstrable benefits and IBS patients - Patient to call her Psychiatrist to schedule follow-up for possible medication adjustment, addressing her anxiety, and consideration of CBT or other interventions.  Depending on availability and treatment options, could also consider reaching out to [facility] as appropriate - Abdominal cramping does not seem as pronounced today.  Holding off on trialing Bentyl, Symax, etc. for the moment - Discussed neuromodulation with TCA, SSRI, but would only start those medications in concert with her Psychiatrist given currently prescribed medications - Ok to review Lomotil prn - Depending on response to fiber supplement, discussed starting either cholestyramine or rifaximin.  Will reserve use of Viberzi if refractory to other medications (confirmed she does have intact gallbladder, no history of liver disease, does not drink any alcohol)  - RTC in 3 months or sooner as needed  2) Bipolar disorder 3) Anxiety/Depression - As above, patient to follow-up with her Psychiatrist.  I spent 35 minutes of time, including in depth chart review, independent review of results as outlined above, communicating results with the patient directly, face-to-face time with the patient, coordinating care, and ordering studies and medications as appropriate, and documentation.          Lavena Bullion ,DO, FACG 07/19/2021, 9:32 AM

## 2021-08-08 ENCOUNTER — Telehealth: Payer: Self-pay

## 2021-08-08 NOTE — Telephone Encounter (Signed)
Returned call, no answer, left vm 

## 2021-08-09 ENCOUNTER — Other Ambulatory Visit: Payer: Self-pay | Admitting: Gastroenterology

## 2021-08-09 NOTE — Telephone Encounter (Signed)
Ok to resume.

## 2021-08-10 MED ORDER — DIPHENOXYLATE-ATROPINE 2.5-0.025 MG PO TABS: ORAL_TABLET | ORAL | 5 refills | Status: DC

## 2021-08-21 ENCOUNTER — Other Ambulatory Visit: Payer: Self-pay

## 2021-08-21 ENCOUNTER — Emergency Department (HOSPITAL_BASED_OUTPATIENT_CLINIC_OR_DEPARTMENT_OTHER)
Admission: EM | Admit: 2021-08-21 | Discharge: 2021-08-21 | Disposition: A | Discharge status: Home / Self Care | Payer: Medicare PPO | Attending: Emergency Medicine | Admitting: Emergency Medicine

## 2021-08-21 ENCOUNTER — Encounter (HOSPITAL_BASED_OUTPATIENT_CLINIC_OR_DEPARTMENT_OTHER): Payer: Self-pay | Admitting: *Deleted

## 2021-08-21 ENCOUNTER — Emergency Department (HOSPITAL_BASED_OUTPATIENT_CLINIC_OR_DEPARTMENT_OTHER): Payer: Medicare PPO

## 2021-08-21 DIAGNOSIS — M25569 Pain in unspecified knee: Secondary | ICD-10-CM

## 2021-08-21 DIAGNOSIS — M25562 Pain in left knee: Secondary | ICD-10-CM | POA: Insufficient documentation

## 2021-08-21 MED ORDER — NAPROXEN 500 MG PO TABS: 500.0000 mg | ORAL_TABLET | Freq: Two times a day (BID) | ORAL | 0 refills | Status: DC

## 2021-08-21 MED ORDER — LIDOCAINE 5 % EX PTCH: 1.0000 | MEDICATED_PATCH | CUTANEOUS | 0 refills | Status: DC

## 2021-08-21 NOTE — ED Triage Notes (Signed)
Pt reports left knee "locked up" today. States this has happened before but she has been unable to "get it back into place" today. Hx of meniscus surgery on the knee

## 2021-08-21 NOTE — Discharge Instructions (Signed)
Follow-up with [facility]  Take medications as prescribed  Return for new or worsening symptoms

## 2021-08-21 NOTE — ED Provider Notes (Signed)
[facility] EMERGENCY DEPARTMENT Provider Note   CSN: [id number] Arrival date & time: 08/21/21  1350    History  Chief Complaint  Patient presents with   Knee Pain    Alicia Logan is a 51 y.o. female "Pop" to left knee earlier today. Prior meniscus sx. Has occ 3 x since prior surgery 3 years ago ( In [place]). Able to flex and extend. "Feels ike something is stuck." No swelling, redness, paresthesias, weakness. No pain currently however with pain with ambulation.  No redness, swelling, warmth, paresthesias.  No tenderness to femur, tib-fib, feet or ankles.  HPI     Home Medications Prior to Admission medications   Medication Sig Start Date End Date Taking? Authorizing Provider  lidocaine (LIDODERM) 5 % Place 1 patch onto the skin daily. Remove & Discard patch within 12 hours or as directed by MD 08/21/21  Yes Mionna Advincula A, PA-C  naproxen (NAPROSYN) 500 MG tablet Take 1 tablet (500 mg total) by mouth 2 (two) times daily. 08/21/21  Yes Stuart Guillen A, PA-C  buPROPion (WELLBUTRIN SR) 150 MG 12 hr tablet Take 150 mg by mouth 2 (two) times daily.    [provider]  diphenhydrAMINE (BENADRYL) 25 MG tablet 1 tablet at bedtime as needed    [provider]  diphenoxylate-atropine (LOMOTIL) 2.5-0.025 MG tablet TAKE 1 TO 2 TABLETS BY MOUTH FOUR TIMES A DAY AS NEEDED DECREASE AS DIRECTED 08/10/21   Cirigliano, Vito V, DO  EPINEPHrine 0.3 mg/0.3 mL IJ SOAJ injection See admin instructions.    [provider]  gabapentin (NEURONTIN) 300 MG capsule Take 1 capsule (300 mg total) by mouth 3 (three) times daily. 07/04/16   [name], NP  QUEtiapine (SEROQUEL) 25 MG tablet     [provider]  risperiDONE (RISPERDAL) 0.5 MG tablet Take 1 tablet (0.5 mg total) by mouth at bedtime. 07/04/16   [name], NP  ziprasidone (GEODON) 20 MG capsule Take 60 mg by mouth See admin instructions. 20 mg in morning, 40 mg at night    [provider]      Allergies    Montelukast, Sulfa antibiotics, and Neomycin    Review of Systems   Review of Systems  Constitutional: Negative.   HENT: Negative.    Respiratory: Negative.    Cardiovascular: Negative.   Gastrointestinal: Negative.   Genitourinary: Negative.   Musculoskeletal:        Left knee pain  Skin: Negative.   Neurological: Negative.   All other systems reviewed and are negative.  Physical Exam Updated Vital Signs BP 106/68 (BP Location: Right Arm)    Pulse 65    Temp 98 F (36.7 C) (Oral)    Resp 16    Ht 5\' 9"  (1.753 m)    Wt 66.2 kg    SpO2 96%    BMI 21.56 kg/m  Physical Exam Vitals and nursing note reviewed.  Constitutional:      General: She is not in acute distress.    Appearance: She is well-developed. She is not ill-appearing, toxic-appearing or diaphoretic.  HENT:     Head: Atraumatic.  Eyes:     Pupils: Pupils are equal, round, and reactive to light.  Cardiovascular:     Rate and Rhythm: Normal rate.     Pulses: Normal pulses.  Pulmonary:     Effort: No respiratory distress.  Abdominal:     General: There is no distension.  Musculoskeletal:  General: Normal range of motion.     Cervical back: Normal range of motion.     Comments: Tenderness medial meniscus.  Slight laxity in anterior drawer.  No bony tenderness femur, tib-fib, foot or ankle.  No edema, erythema or warmth.  Skin:    General: Skin is warm and dry.     Capillary Refill: Capillary refill takes less than 2 seconds.  Neurological:     General: No focal deficit present.     Mental Status: She is alert and oriented to person, place, and time.     Comments: Ambulatory, intact sensation, equal strength  Psychiatric:        Mood and Affect: Mood normal.    ED Results / Procedures / Treatments   Labs (all labs ordered are listed, but only abnormal results are displayed) Labs Reviewed - No data to display  EKG None  Radiology DG Knee Complete 4 Views  Left  Result Date: 08/21/2021 CLINICAL DATA:  Pt states that every 6 months or so her left knee will " lock up" and that most of the time just standing up will put her knee "back in place" but not this time. Pt is able to bend and walk on left knee. 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 Hx of meniscus surgery. EXAM: LEFT KNEE - COMPLETE 4+ VIEW COMPARISON:  08/16/2015. FINDINGS: No evidence of fracture, dislocation, or joint effusion. No evidence of arthropathy or other focal bone abnormality. Soft tissues are unremarkable. IMPRESSION: Negative. Electronically Signed   By: Amie Portlandavid  Ormond M.D.   On: 08/21/2021 14:58    Procedures .Ortho Injury Treatment  Date/Time: 08/21/2021 3:17 PM Performed by: Ralph LeydenHenderly, Jatavion Peaster A, PA-C Authorized by: Linwood DibblesHenderly, Liberato Stansbery A, PA-C   Consent:    Consent obtained:  Verbal   Consent given by:  Patient   Risks discussed:  Fracture, nerve damage, restricted joint movement, vascular damage, stiffness, recurrent dislocation and irreducible dislocation   Alternatives discussed:  No treatment, alternative treatment, immobilization, referral and delayed treatmentInjury location: knee Location details: left knee Injury type: soft tissue Pre-procedure neurovascular assessment: neurovascularly intact Pre-procedure distal perfusion: normal Pre-procedure neurological function: normal Pre-procedure range of motion: normal  Anesthesia: Local anesthesia used: no  Patient sedated: NoImmobilization: crutches, brace and splint Splint type: knee immobilizer. Splint Applied by: ED Tech Post-procedure neurovascular assessment: post-procedure neurovascularly intact Post-procedure distal perfusion: normal Post-procedure neurological function: normal Post-procedure range of motion: normal      Medications Ordered in ED Medications - No data to display  ED Course/ Medical Decision Making/ A&P    51 year old here for evaluation of left knee pain after locking up earlier  today.  Has had this happen previously.  Does have prior history of meniscus surgery.  [facility].  She is in tenderness to her medial meniscus, slight laxity in anterior drawer.  No bony tenderness femur, tib-fib.  Patella is well aligned, low suspicion for prior tibial dislocation.  Neurovascularly intact.  Skin well perfused.  Imaging personally reviewed and interpreted: No fracture, dislocation or effusion  Discussed with patient, suspect sprain or strain, meniscal injury.  Will place in knee immobilizer, crutches.  Discussed RICE present management, follow-up with her orthopedic.  At this time I have low suspicion for occult fracture, dislocation, VTE, ischemia, infectious process, vascular injury or dislocation  The patient has been appropriately medically screened and/or stabilized in the ED. I have low suspicion for any other emergent medical condition which would require  further screening, evaluation or treatment in the ED or require inpatient management.  Patient is hemodynamically stable and in no acute distress.  Patient able to ambulate in department prior to ED.  Evaluation does not show acute pathology that would require ongoing or additional emergent interventions while in the emergency department or further inpatient treatment.  I have discussed the diagnosis with the patient and answered all questions.  Pain is been managed while in the emergency department and patient has no further complaints prior to discharge.  Patient is comfortable with plan discussed in room and is stable for discharge at this time.  I have discussed strict return precautions for returning to the emergency department.  Patient was encouraged to follow-up with PCP/specialist refer to at discharge.                           Medical Decision Making Amount and/or Complexity of Data Reviewed External Data Reviewed: labs and notes. Radiology: ordered and independent  interpretation performed. Decision-making details documented in ED Course.  Risk OTC drugs. Prescription drug management. Diagnosis or treatment significantly limited by social determinants of health. Risk Details: Risk: Prior knee surgeries  Do not feel patient needs additional labs, imaging or hospitalization at this time          Final Clinical Impression(s) / ED Diagnoses Final diagnoses:  None    Rx / DC Orders ED Discharge Orders          Ordered    naproxen (NAPROSYN) 500 MG tablet  2 times daily        08/21/21 1519    lidocaine (LIDODERM) 5 %  Every 24 hours        08/21/21 1519              Zorianna Taliaferro A, PA-C 08/21/21 1520    [place], DO 08/22/21 [phone]

## 2021-08-22 ENCOUNTER — Other Ambulatory Visit: Payer: Self-pay | Admitting: Physician Assistant

## 2021-08-22 ENCOUNTER — Other Ambulatory Visit (HOSPITAL_COMMUNITY): Payer: Self-pay | Admitting: Physician Assistant

## 2021-08-22 DIAGNOSIS — M25562 Pain in left knee: Secondary | ICD-10-CM

## 2021-08-29 ENCOUNTER — Other Ambulatory Visit: Payer: Self-pay | Admitting: Physician Assistant

## 2021-08-29 DIAGNOSIS — M25562 Pain in left knee: Secondary | ICD-10-CM

## 2021-09-20 ENCOUNTER — Other Ambulatory Visit: Payer: Self-pay | Admitting: Family Medicine

## 2021-09-21 ENCOUNTER — Ambulatory Visit
Admission: RE | Admit: 2021-09-21 | Discharge: 2021-09-21 | Disposition: A | Discharge status: Home / Self Care | Payer: Medicare PPO | Source: Ambulatory Visit | Attending: Physician Assistant | Admitting: Physician Assistant

## 2021-09-21 ENCOUNTER — Ambulatory Visit: Payer: Medicare PPO | Admitting: Advanced Practice Midwife

## 2021-09-21 ENCOUNTER — Other Ambulatory Visit: Payer: Self-pay

## 2021-09-21 DIAGNOSIS — M25562 Pain in left knee: Secondary | ICD-10-CM

## 2021-09-21 MED ORDER — IOPAMIDOL (ISOVUE-M 200) INJECTION 41%
30.0000 mL | Freq: Once | INTRAMUSCULAR | Status: AC
  Administered 2021-09-21: 30 mL via INTRA_ARTICULAR

## 2021-09-22 ENCOUNTER — Ambulatory Visit (INDEPENDENT_AMBULATORY_CARE_PROVIDER_SITE_OTHER): Payer: Medicare PPO | Admitting: *Deleted

## 2021-09-22 DIAGNOSIS — Z3042 Encounter for surveillance of injectable contraceptive: Secondary | ICD-10-CM

## 2021-09-22 NOTE — Progress Notes (Signed)
Subjective: Alicia Logan presents today for Depo injection.  ?   ?OBJECTIVE: Appears well, in no apparent distress.  ?  ?ASSESSMENT: Depo injection given  ?  ?PLAN: ?Next Depo due 5/18-12/22/21 ?Pt advised to keep upcoming AEX.  ? ?Administrations This Visit   ? ? medroxyPROGESTERone (DEPO-PROVERA) injection 150 mg   ? ? Admin Date ?09/22/2021 Action ?Given Dose ?150 mg Route ?Intramuscular Administered By ?Marya Landry D, CMA  ? ?  ?  ? ?  ? ? ? ? ?

## 2021-10-25 ENCOUNTER — Encounter: Payer: Self-pay | Admitting: Gastroenterology

## 2021-10-27 ENCOUNTER — Encounter: Payer: Self-pay | Admitting: Obstetrics and Gynecology

## 2021-10-27 ENCOUNTER — Other Ambulatory Visit (HOSPITAL_COMMUNITY)
Admission: RE | Admit: 2021-10-27 | Discharge: 2021-10-27 | Disposition: A | Discharge status: Home / Self Care | Payer: Medicare PPO | Source: Ambulatory Visit | Attending: Obstetrics and Gynecology | Admitting: Obstetrics and Gynecology

## 2021-10-27 ENCOUNTER — Ambulatory Visit (INDEPENDENT_AMBULATORY_CARE_PROVIDER_SITE_OTHER): Payer: Medicare PPO | Admitting: Obstetrics and Gynecology

## 2021-10-27 VITALS — BP 123/76 | HR 59 | Ht 69.0 in | Wt 148.6 lb

## 2021-10-27 DIAGNOSIS — Z01419 Encounter for gynecological examination (general) (routine) without abnormal findings: Secondary | ICD-10-CM | POA: Diagnosis present

## 2021-10-27 DIAGNOSIS — Z1151 Encounter for screening for human papillomavirus (HPV): Secondary | ICD-10-CM | POA: Insufficient documentation

## 2021-10-27 NOTE — Progress Notes (Signed)
Pt is in the office for annual ?Last pap 03/12/2020 ?Last mammogram 11/04/2020 ?Currently on depo for [facility] ?

## 2021-10-27 NOTE — Progress Notes (Signed)
? ?  WELL-WOMAN PHYSICAL & PAP ?Patient name: Alicia Logan MRN [id number]  Date of birth: Jul 24, 1971 ?Chief Complaint:   ?Gynecologic Exam ? ?History of Present Illness:   ?Alicia Logan is a 51 y.o. G0P0000 Caucasian female being seen today for a routine well-woman exam.  ?Current complaints: no GYN complaints ? ?PCP: none      ?does not desire labs ?No LMP recorded. Patient has had an injection. ?The current method of family planning is Depo-Provera injections.  ?Last pap 03/12/2020. Results were: normal ?Last mammogram: 11/04/2020. Results were: normal. Family h/o breast cancer: unsure ?Last colonoscopy: 03/2021. Results were: normal. Family h/o colorectal cancer: unsure ?Review of Systems:   ?Pertinent items are noted in HPI ?Denies any headaches, blurred vision, fatigue, shortness of breath, chest pain, abdominal pain, abnormal vaginal discharge/itching/odor/irritation, problems with periods, bowel movements, urination, or intercourse unless otherwise stated above. ?Pertinent History Reviewed:  ?Reviewed past medical,surgical, social and family history.  ?Reviewed problem list, medications and allergies. ?Physical Assessment:  ? ?Vitals:  ? 10/27/21 0818  ?BP: 123/76  ?Pulse: (!) 59  ?Weight: 148 lb 9.6 oz (67.4 kg)  ?Height: 5\' 9"  (1.753 m)  ?Body mass index is 21.94 kg/m?. ?  ?     Physical Examination:  ? General appearance - well appearing, and in no distress ? Mental status - alert, oriented to person, place, and time ? Psych:  She has a normal mood and affect ? Skin - warm and dry, normal color, no suspicious lesions noted ? Chest - effort normal, all lung fields clear to auscultation bilaterally ? Heart - normal rate and regular rhythm ? Neck:  midline trachea, no thyromegaly or nodules ? Breasts - breasts appear normal, no suspicious masses, no skin or nipple changes or  axillary nodes ? Abdomen - soft, nontender, nondistended, no masses or organomegaly ? Pelvic - VULVA: normal appearing vulva with no  masses, tenderness or lesions  VAGINA: normal appearing vagina with normal color and discharge, no lesions  CERVIX: normal appearing cervix without discharge or lesions, no CMT ? Thin prep pap is done with HR HPV cotesting ? UTERUS: uterus is felt to be normal size, shape, consistency and nontender  ? ADNEXA: No adnexal masses or tenderness noted. ? Rectal - deferred ? Extremities:  No swelling or varicosities noted ? ?No results found for this or any previous visit (from the past 24 hour(s)).  ?Assessment & Plan:  ?1) Well-Woman Exam with Pap ? ?Labs/procedures today: none ? ?Mammogram schedule for this month or sooner if problems ?Colonoscopy in 1 year or sooner if problems ? ?No orders of the defined types were placed in this encounter. ? ? ?Meds: No orders of the defined types were placed in this encounter. ? ? ?Follow-up: No follow-ups on file. ? ? MSN, CNM ?10/27/2021 ?8:51 AM ? ?

## 2021-11-01 LAB — CYTOLOGY - PAP
Comment: NEGATIVE
Diagnosis: NEGATIVE
High risk HPV: NEGATIVE

## 2021-12-15 ENCOUNTER — Ambulatory Visit (INDEPENDENT_AMBULATORY_CARE_PROVIDER_SITE_OTHER): Payer: Medicare PPO | Admitting: *Deleted

## 2021-12-15 VITALS — BP 128/82 | HR 76

## 2021-12-15 DIAGNOSIS — Z3042 Encounter for surveillance of injectable contraceptive: Secondary | ICD-10-CM | POA: Diagnosis not present

## 2021-12-15 MED ORDER — MEDROXYPROGESTERONE ACETATE 150 MG/ML IM SUSP
150.0000 mg | Freq: Once | INTRAMUSCULAR | Status: AC
  Administered 2021-12-15: 150 mg via INTRAMUSCULAR

## 2021-12-15 NOTE — Progress Notes (Cosign Needed)
Date last pap: 10/27/21. Last Depo-Provera: 09/22/21. Side Effects if any: NA. Serum HCG indicated? NA. Depo-Provera 150 mg IM given by: Selena Batten. Rolley Sims, RNC in [employer] . Next appointment due 03/02/22-03/16/22.

## 2022-02-15 NOTE — Progress Notes (Signed)
Patient was assessed and managed by nursing staff during this encounter. I have reviewed the chart and agree with the documentation and plan. I have also made any necessary editorial changes.  Scheryl Darter, MD 02/15/2022 2:25 PM

## 2022-03-16 ENCOUNTER — Other Ambulatory Visit: Payer: Self-pay

## 2022-03-16 ENCOUNTER — Ambulatory Visit: Payer: Medicare PPO

## 2022-03-16 ENCOUNTER — Ambulatory Visit (INDEPENDENT_AMBULATORY_CARE_PROVIDER_SITE_OTHER): Payer: Medicare PPO

## 2022-03-16 VITALS — BP 124/70 | HR 74 | Wt 147.7 lb

## 2022-03-16 DIAGNOSIS — Z3042 Encounter for surveillance of injectable contraceptive: Secondary | ICD-10-CM | POA: Diagnosis not present

## 2022-03-16 MED ORDER — MEDROXYPROGESTERONE ACETATE 150 MG/ML IM SUSP: 150.0000 mg | INTRAMUSCULAR | 0 refills | Status: DC

## 2022-03-16 NOTE — Progress Notes (Addendum)
Subjective:  Pt in for Depo Provera injection.    Objective: Need for contraception. No unusual complaints.    Assessment: Depo given R upper outer quadrant. Pt tolerated Depo injection.  Plan:  Next Depo scheduled 06/01/22  Administrations This Visit     medroxyPROGESTERone (DEPO-PROVERA) injection 150 mg     Admin Date 03/16/2022 Action Given Dose 150 mg Route Intramuscular Administered By Lewayne Bunting, CMA

## 2022-03-31 NOTE — Progress Notes (Signed)
Sent message, via epic in basket, requesting orders in epic from surgeon.  

## 2022-04-06 NOTE — Patient Instructions (Signed)
DUE TO COVID-19 ONLY TWO VISITORS  (aged 49 and older)  ARE ALLOWED TO COME WITH YOU AND STAY IN THE WAITING ROOM ONLY DURING PRE OP AND PROCEDURE.   **NO VISITORS ARE ALLOWED IN THE SHORT STAY AREA OR RECOVERY ROOM!!**  IF YOU WILL BE ADMITTED INTO THE HOSPITAL YOU ARE ALLOWED ONLY FOUR SUPPORT PEOPLE DURING VISITATION HOURS ONLY (7 AM -8PM)   The support person(s) must pass our screening, gel in and out, and wear a mask at all times, including in the patient's room. Patients must also wear a mask when staff or their support person are in the room. Visitors GUEST BADGE MUST BE WORN VISIBLY  One adult visitor may remain with you overnight and MUST be in the room by 8 P.M.     Your procedure is scheduled on: 04/20/22   Report to [facility] Main Entrance    Report to admitting at : 5:15 AM   Call this number if you have problems the morning of surgery [phone]   Do not eat food :After Midnight.   After Midnight you may have the following liquids until: 4:00 AM DAY OF SURGERY  Water Black Coffee (sugar ok, NO MILK/CREAM OR CREAMERS)  Tea (sugar ok, NO MILK/CREAM OR CREAMERS) regular and decaf                             Plain Jell-O (NO RED)                                           Fruit ices (not with fruit pulp, NO RED)                                     Popsicles (NO RED)                                                                  Juice: apple, WHITE grape, WHITE cranberry Sports drinks like Gatorade (NO RED)    Drink  Ensure drink AT : 4:00 AM the day of surgery.    The day of surgery:  Drink ONE (1) Pre-Surgery Clear Ensure or G2 at AM the morning of surgery. Drink in one sitting. Do not sip.  This drink was given to you during your hospital  pre-op appointment visit. Nothing else to drink after completing the  Pre-Surgery Clear Ensure or G2.          If you have questions, please contact your surgeon's office.   Oral Hygiene is also important to reduce  your risk of infection.                                    Remember - BRUSH YOUR TEETH THE MORNING OF SURGERY WITH YOUR REGULAR TOOTHPASTE   Do NOT smoke after Midnight   Take these medicines the morning of surgery with A SIP OF WATER: gabapentin,bupropion,Seroquel,Geodon.  DO NOT TAKE ANY ORAL DIABETIC MEDICATIONS  DAY OF YOUR SURGERY  Bring CPAP mask and tubing day of surgery.                              You may not have any metal on your body including hair pins, jewelry, and body piercing             Do not wear make-up, lotions, powders, perfumes/cologne, or deodorant  Do not wear nail polish including gel and S&S, artificial/acrylic nails, or any other type of covering on natural nails including finger and toenails. If you have artificial nails, gel coating, etc. that needs to be removed by a nail salon please have this removed prior to surgery or surgery may need to be canceled/ delayed if the surgeon/ anesthesia feels like they are unable to be safely monitored.   Do not shave  48 hours prior to surgery.    Do not bring valuables to the hospital. [facility] IS NOT             RESPONSIBLE   FOR VALUABLES.   Contacts, dentures or bridgework may not be worn into surgery.   Bring small overnight bag day of surgery.   DO NOT BRING YOUR HOME MEDICATIONS TO THE HOSPITAL. PHARMACY WILL DISPENSE MEDICATIONS LISTED ON YOUR MEDICATION LIST TO YOU DURING YOUR ADMISSION IN THE HOSPITAL!    Patients discharged on the day of surgery will not be allowed to drive home.  Someone NEEDS to stay with you for the first 24 hours after anesthesia.   Special Instructions: Bring a copy of your healthcare power of attorney and living will documents         the day of surgery if you haven't scanned them before.              Please read over the following fact sheets you were given: IF YOU HAVE QUESTIONS ABOUT YOUR PRE-OP INSTRUCTIONS PLEASE CALL [phone]     [facility] - Preparing for  Surgery Before surgery, you can play an important role.  Because skin is not sterile, your skin needs to be as free of germs as possible.  You can reduce the number of germs on your skin by washing with CHG (chlorahexidine gluconate) soap before surgery.  CHG is an antiseptic cleaner which kills germs and bonds with the skin to continue killing germs even after washing. Please DO NOT use if you have an allergy to CHG or antibacterial soaps.  If your skin becomes reddened/irritated stop using the CHG and inform your nurse when you arrive at Short Stay. Do not shave (including legs and underarms) for at least 48 hours prior to the first CHG shower.  You may shave your face/neck. Please follow these instructions carefully:  1.  Shower with CHG Soap the night before surgery and the  morning of Surgery.  2.  If you choose to wash your hair, wash your hair first as usual with your  normal  shampoo.  3.  After you shampoo, rinse your hair and body thoroughly to remove the  shampoo.                           4.  Use CHG as you would any other liquid soap.  You can apply chg directly  to the skin and wash  Gently with a scrungie or clean washcloth.  5.  Apply the CHG Soap to your body ONLY FROM THE NECK DOWN.   Do not use on face/ open                           Wound or open sores. Avoid contact with eyes, ears mouth and genitals (private parts).                       Wash face,  Genitals (private parts) with your normal soap.             6.  Wash thoroughly, paying special attention to the area where your surgery  will be performed.  7.  Thoroughly rinse your body with warm water from the neck down.  8.  DO NOT shower/wash with your normal soap after using and rinsing off  the CHG Soap.                9.  Pat yourself dry with a clean towel.            10.  Wear clean pajamas.            11.  Place clean sheets on your bed the night of your first shower and do not  sleep with pets. Day  of Surgery : Do not apply any lotions/deodorants the morning of surgery.  Please wear clean clothes to the hospital/surgery center.  FAILURE TO FOLLOW THESE INSTRUCTIONS MAY RESULT IN THE CANCELLATION OF YOUR SURGERY PATIENT SIGNATURE_________________________________  NURSE SIGNATURE__________________________________  ________________________________________________________________________

## 2022-04-07 ENCOUNTER — Encounter (HOSPITAL_COMMUNITY)
Admission: RE | Admit: 2022-04-07 | Discharge: 2022-04-07 | Disposition: A | Discharge status: Home / Self Care | Payer: Medicare PPO | Source: Ambulatory Visit | Attending: Orthopedic Surgery | Admitting: Orthopedic Surgery

## 2022-04-07 ENCOUNTER — Other Ambulatory Visit: Payer: Self-pay

## 2022-04-07 ENCOUNTER — Encounter (HOSPITAL_COMMUNITY): Payer: Self-pay

## 2022-04-07 VITALS — BP 115/95 | HR 101 | Temp 98.1°F | Ht 69.0 in | Wt 145.0 lb

## 2022-04-07 DIAGNOSIS — Z01812 Encounter for preprocedural laboratory examination: Secondary | ICD-10-CM | POA: Diagnosis not present

## 2022-04-07 DIAGNOSIS — Z01818 Encounter for other preprocedural examination: Secondary | ICD-10-CM

## 2022-04-07 HISTORY — DX: Unspecified osteoarthritis, unspecified site: M19.90

## 2022-04-07 HISTORY — DX: Other psychoactive substance abuse, uncomplicated: F19.10

## 2022-04-07 LAB — CBC
HCT: 36.9 % (ref 36.0–46.0)
Hemoglobin: 12.2 g/dL (ref 12.0–15.0)
MCH: 31.2 pg (ref 26.0–34.0)
MCHC: 33.1 g/dL (ref 30.0–36.0)
MCV: 94.4 fL (ref 80.0–100.0)
Platelets: 192 10*3/uL (ref 150–400)
RBC: 3.91 MIL/uL (ref 3.87–5.11)
RDW: 12.2 % (ref 11.5–15.5)
WBC: 7.2 10*3/uL (ref 4.0–10.5)
nRBC: 0 % (ref 0.0–0.2)

## 2022-04-07 NOTE — Progress Notes (Addendum)
For Short Stay: COVID SWAB appointment date: Date of COVID positive in last 90 days:  Bowel Prep reminder:   For Anesthesia: PCP - NO PCP. Cardiologist -   Chest x-ray -  EKG -  Stress Test -  ECHO -  Cardiac Cath -  Pacemaker/ICD device last checked: Pacemaker orders received: Device Rep notified:  Spinal Cord Stimulator: Yes  Sleep Study -  CPAP -   Fasting Blood Sugar -  Checks Blood Sugar _____ times a day Date and result of last Hgb A1c-  Blood Thinner Instructions: Aspirin Instructions: Last Dose:  Activity level: Can go up a flight of stairs and activities of daily living without stopping and without chest pain and/or shortness of breath   Able to exercise without chest pain and/or shortness of breath   Unable to go up a flight of stairs without chest pain and/or shortness of breath     Anesthesia review:   Patient denies shortness of breath, fever, cough and chest pain at PAT appointment   Patient verbalized understanding of instructions that were given to them at the PAT appointment. Patient was also instructed that they will need to review over the PAT instructions again at home before surgery.

## 2022-04-20 ENCOUNTER — Ambulatory Visit (HOSPITAL_COMMUNITY)
Admission: RE | Admit: 2022-04-20 | Discharge: 2022-04-20 | Disposition: A | Discharge status: Home / Self Care | Payer: Medicare PPO | Attending: Orthopedic Surgery | Admitting: Orthopedic Surgery

## 2022-04-20 ENCOUNTER — Encounter (HOSPITAL_COMMUNITY): Payer: Self-pay | Admitting: Orthopedic Surgery

## 2022-04-20 ENCOUNTER — Other Ambulatory Visit: Payer: Self-pay

## 2022-04-20 ENCOUNTER — Ambulatory Visit (HOSPITAL_BASED_OUTPATIENT_CLINIC_OR_DEPARTMENT_OTHER): Payer: Medicare PPO | Admitting: Anesthesiology

## 2022-04-20 ENCOUNTER — Encounter (HOSPITAL_COMMUNITY)
Admission: RE | Disposition: A | Discharge status: Home / Self Care | Payer: Self-pay | Source: Home / Self Care | Attending: Orthopedic Surgery

## 2022-04-20 ENCOUNTER — Ambulatory Visit (HOSPITAL_COMMUNITY): Payer: Medicare PPO | Admitting: Anesthesiology

## 2022-04-20 DIAGNOSIS — S83272A Complex tear of lateral meniscus, current injury, left knee, initial encounter: Secondary | ICD-10-CM | POA: Diagnosis not present

## 2022-04-20 DIAGNOSIS — M659 Synovitis and tenosynovitis, unspecified: Secondary | ICD-10-CM | POA: Insufficient documentation

## 2022-04-20 DIAGNOSIS — F3189 Other bipolar disorder: Secondary | ICD-10-CM | POA: Diagnosis not present

## 2022-04-20 DIAGNOSIS — S83282A Other tear of lateral meniscus, current injury, left knee, initial encounter: Secondary | ICD-10-CM | POA: Diagnosis present

## 2022-04-20 DIAGNOSIS — Z87891 Personal history of nicotine dependence: Secondary | ICD-10-CM | POA: Insufficient documentation

## 2022-04-20 DIAGNOSIS — Z01818 Encounter for other preprocedural examination: Secondary | ICD-10-CM

## 2022-04-20 DIAGNOSIS — X58XXXA Exposure to other specified factors, initial encounter: Secondary | ICD-10-CM | POA: Insufficient documentation

## 2022-04-20 DIAGNOSIS — Y939 Activity, unspecified: Secondary | ICD-10-CM | POA: Insufficient documentation

## 2022-04-20 HISTORY — PX: KNEE ARTHROSCOPY WITH LATERAL MENISECTOMY: SHX6193

## 2022-04-20 LAB — POCT PREGNANCY, URINE: Preg Test, Ur: NEGATIVE

## 2022-04-20 SURGERY — ARTHROSCOPY, KNEE, WITH LATERAL MENISCECTOMY
Anesthesia: General | Site: Knee | Laterality: Left

## 2022-04-20 MED ORDER — ONDANSETRON HCL 4 MG/2ML IJ SOLN: 4.0000 mg | Freq: Once | INTRAMUSCULAR | Status: DC | PRN

## 2022-04-20 MED ORDER — PROPOFOL 10 MG/ML IV BOLUS
INTRAVENOUS | Status: AC
  Filled 2022-04-20: qty 20

## 2022-04-20 MED ORDER — LIDOCAINE HCL (PF) 2 % IJ SOLN
INTRAMUSCULAR | Status: AC
  Filled 2022-04-20: qty 5

## 2022-04-20 MED ORDER — PROPOFOL 10 MG/ML IV BOLUS
INTRAVENOUS | Status: DC | PRN
  Administered 2022-04-20: 200 mg via INTRAVENOUS

## 2022-04-20 MED ORDER — OXYCODONE HCL 5 MG PO TABS
ORAL_TABLET | ORAL | Status: AC
  Filled 2022-04-20: qty 1

## 2022-04-20 MED ORDER — FENTANYL CITRATE PF 50 MCG/ML IJ SOSY
PREFILLED_SYRINGE | INTRAMUSCULAR | Status: AC
  Administered 2022-04-20: 50 ug via INTRAVENOUS
  Filled 2022-04-20: qty 2

## 2022-04-20 MED ORDER — FENTANYL CITRATE (PF) 100 MCG/2ML IJ SOLN
INTRAMUSCULAR | Status: DC | PRN
  Administered 2022-04-20 (×2): 50 ug via INTRAVENOUS

## 2022-04-20 MED ORDER — MIDAZOLAM HCL 2 MG/2ML IJ SOLN
INTRAMUSCULAR | Status: AC
  Filled 2022-04-20: qty 2

## 2022-04-20 MED ORDER — OXYCODONE HCL 5 MG PO TABS
5.0000 mg | ORAL_TABLET | Freq: Once | ORAL | Status: AC | PRN
  Administered 2022-04-20: 5 mg via ORAL

## 2022-04-20 MED ORDER — CHLORHEXIDINE GLUCONATE 0.12 % MT SOLN
15.0000 mL | Freq: Once | OROMUCOSAL | Status: AC
  Administered 2022-04-20: 15 mL via OROMUCOSAL

## 2022-04-20 MED ORDER — ORAL CARE MOUTH RINSE: 15.0000 mL | Freq: Once | OROMUCOSAL | Status: AC

## 2022-04-20 MED ORDER — FENTANYL CITRATE PF 50 MCG/ML IJ SOSY: 25.0000 ug | PREFILLED_SYRINGE | INTRAMUSCULAR | Status: DC | PRN

## 2022-04-20 MED ORDER — ONDANSETRON HCL 4 MG PO TABS: 4.0000 mg | ORAL_TABLET | Freq: Three times a day (TID) | ORAL | 1 refills | Status: DC | PRN

## 2022-04-20 MED ORDER — DEXMEDETOMIDINE HCL IN NACL 80 MCG/20ML IV SOLN
INTRAVENOUS | Status: DC | PRN
  Administered 2022-04-20: 8 ug via BUCCAL

## 2022-04-20 MED ORDER — ACETAMINOPHEN 10 MG/ML IV SOLN: 1000.0000 mg | Freq: Once | INTRAVENOUS | Status: DC | PRN

## 2022-04-20 MED ORDER — DEXAMETHASONE SODIUM PHOSPHATE 10 MG/ML IJ SOLN
INTRAMUSCULAR | Status: DC | PRN
  Administered 2022-04-20: 4 mg via INTRAVENOUS

## 2022-04-20 MED ORDER — LACTATED RINGERS IV SOLN: INTRAVENOUS | Status: DC

## 2022-04-20 MED ORDER — BUPIVACAINE-EPINEPHRINE (PF) 0.25% -1:200000 IJ SOLN
INTRAMUSCULAR | Status: AC
  Filled 2022-04-20: qty 30

## 2022-04-20 MED ORDER — BUPIVACAINE HCL 0.25 % IJ SOLN
INTRAMUSCULAR | Status: DC | PRN
  Administered 2022-04-20: 20 mL

## 2022-04-20 MED ORDER — FENTANYL CITRATE (PF) 100 MCG/2ML IJ SOLN
INTRAMUSCULAR | Status: AC
  Filled 2022-04-20: qty 2

## 2022-04-20 MED ORDER — DEXAMETHASONE SODIUM PHOSPHATE 10 MG/ML IJ SOLN
INTRAMUSCULAR | Status: AC
  Filled 2022-04-20: qty 1

## 2022-04-20 MED ORDER — CEFAZOLIN SODIUM-DEXTROSE 2-4 GM/100ML-% IV SOLN
2.0000 g | INTRAVENOUS | Status: AC
  Administered 2022-04-20: 2 g via INTRAVENOUS
  Filled 2022-04-20: qty 100

## 2022-04-20 MED ORDER — SODIUM CHLORIDE 0.9 % IR SOLN
Status: DC | PRN
  Administered 2022-04-20: 3000 mL

## 2022-04-20 MED ORDER — OXYCODONE HCL 5 MG/5ML PO SOLN: 5.0000 mg | Freq: Once | ORAL | Status: AC | PRN

## 2022-04-20 MED ORDER — 0.9 % SODIUM CHLORIDE (POUR BTL) OPTIME
TOPICAL | Status: DC | PRN
  Administered 2022-04-20: 500 mL

## 2022-04-20 MED ORDER — ONDANSETRON HCL 4 MG/2ML IJ SOLN
INTRAMUSCULAR | Status: AC
  Filled 2022-04-20: qty 2

## 2022-04-20 MED ORDER — ONDANSETRON HCL 4 MG/2ML IJ SOLN
INTRAMUSCULAR | Status: DC | PRN
  Administered 2022-04-20: 4 mg via INTRAVENOUS

## 2022-04-20 MED ORDER — TRAMADOL HCL 50 MG PO TABS: 50.0000 mg | ORAL_TABLET | Freq: Four times a day (QID) | ORAL | 0 refills | Status: DC | PRN

## 2022-04-20 MED ORDER — MIDAZOLAM HCL 5 MG/5ML IJ SOLN
INTRAMUSCULAR | Status: DC | PRN
  Administered 2022-04-20 (×2): 1 mg via INTRAVENOUS

## 2022-04-20 MED ORDER — LIDOCAINE HCL 1 % IJ SOLN
INTRAMUSCULAR | Status: DC | PRN
  Administered 2022-04-20: 100 mg via INTRADERMAL

## 2022-04-20 SURGICAL SUPPLY — 32 items
BAG COUNTER SPONGE SURGICOUNT (BAG) ×1 IMPLANT
BLADE SHAVER TORPEDO 4X13 (MISCELLANEOUS) ×1 IMPLANT
BNDG ELASTIC 6X5.8 VLCR STR LF (GAUZE/BANDAGES/DRESSINGS) ×1 IMPLANT
COVER SURGICAL LIGHT HANDLE (MISCELLANEOUS) ×1 IMPLANT
CUFF TOURN SGL QUICK 34 (TOURNIQUET CUFF)
CUFF TRNQT CYL 34X4.125X (TOURNIQUET CUFF) IMPLANT
DRAPE ARTHROSCOPY W/POUCH 114 (DRAPES) ×1 IMPLANT
DRAPE SHEET LG 3/4 BI-LAMINATE (DRAPES) IMPLANT
DRAPE U-SHAPE 47X51 STRL (DRAPES) ×1 IMPLANT
DURAPREP 26ML APPLICATOR (WOUND CARE) ×1 IMPLANT
GAUZE 4X4 16PLY ~~LOC~~+RFID DBL (SPONGE) ×1 IMPLANT
GAUZE PAD ABD 8X10 STRL (GAUZE/BANDAGES/DRESSINGS) ×2 IMPLANT
GAUZE SPONGE 4X4 12PLY STRL (GAUZE/BANDAGES/DRESSINGS) ×1 IMPLANT
GLOVE BIO SURGEON STRL SZ7.5 (GLOVE) ×2 IMPLANT
GLOVE BIOGEL PI IND STRL 8 (GLOVE) ×2 IMPLANT
GOWN STRL REUS W/ TWL XL LVL3 (GOWN DISPOSABLE) ×2 IMPLANT
GOWN STRL REUS W/TWL XL LVL3 (GOWN DISPOSABLE) ×2
IV NS IRRIG 3000ML ARTHROMATIC (IV SOLUTION) ×2 IMPLANT
KIT BASIN OR (CUSTOM PROCEDURE TRAY) ×1 IMPLANT
KIT TURNOVER KIT A (KITS) IMPLANT
MANIFOLD NEPTUNE II (INSTRUMENTS) ×1 IMPLANT
PACK ARTHROSCOPY WL (CUSTOM PROCEDURE TRAY) ×1 IMPLANT
PORT APPOLLO RF 90DEGREE MULTI (SURGICAL WAND) IMPLANT
PROBE HOOK APOLLO (SURGICAL WAND) IMPLANT
STRIP CLOSURE SKIN 1/2X4 (GAUZE/BANDAGES/DRESSINGS) ×1 IMPLANT
SUT ETHILON 4 0 PS 2 18 (SUTURE) IMPLANT
SUT MNCRL AB 3-0 PS2 18 (SUTURE) ×1 IMPLANT
TOWEL OR 17X26 10 PK STRL BLUE (TOWEL DISPOSABLE) ×1 IMPLANT
TUBING ARTHROSCOPY IRRIG 16FT (MISCELLANEOUS) ×1 IMPLANT
WAND APOLLORF SJ50 AR-9845 (SURGICAL WAND) IMPLANT
WATER STERILE IRR 500ML POUR (IV SOLUTION) ×1 IMPLANT
WRAP KNEE MAXI GEL POST OP (GAUZE/BANDAGES/DRESSINGS) ×1 IMPLANT

## 2022-04-20 NOTE — Op Note (Signed)
Surgery Date: 04/20/2022  Surgeon(s): Nicholes Stairs, MD  ANESTHESIA:  general, local  Assistant: Jonelle Sidle, PA-C  Assistant attestation:  PA McClung present for the entire procedure.  FLUIDS: Per anesthesia record.   ESTIMATED BLOOD LOSS: minimal  PREOPERATIVE DIAGNOSES:  1.  Left knee medial meniscus tear 2.  knee synovitis  POSTOPERATIVE DIAGNOSES:  1.  Left knee complex tear lateral meniscus, initial encounter 2.  Left knee synovitis  PROCEDURES PERFORMED:  1.  Left knee arthroscopy with limited synovectomy 2.  Left knee arthroscopy with arthroscopic partial lateral meniscectomy  DESCRIPTION OF PROCEDURE: Alicia Logan is a 51 y.o.-year-old female with left knee lateral and possible medial meniscus tear. Plans are to proceed with partial meniscectomies and diagnostic arthroscopy with debridement as indicated. Full discussion held regarding risks benefits alternatives and complications related surgical intervention. Conservative care options reviewed. All questions answered.  The patient was identified in the preoperative holding area and the operative extremity was marked. The patient was brought to the operating room and transferred to operating table in a supine position. Satisfactory general anesthesia was induced by anesthesiology.    Standard anterolateral, anteromedial arthroscopy portals were obtained. The anteromedial portal was obtained with a spinal needle for localization under direct visualization with subsequent diagnostic findings.   Anteromedial and anterolateral chambers: moderate synovitis. The synovitis was debrided with a 4.5 mm full radius shaver through both the anteromedial and lateral portals.   Suprapatellar pouch and gutters: mild synovitis or debris. Patella chondral surface: Grade 0 Trochlear chondral surface: Grade 1 Patellofemoral tracking: Midline and level Medial meniscus: Intact with no tears on probing.  Medial femoral condyle  flexion bearing surface: Grade 0 Medial femoral condyle extension bearing surface: Grade 0 Medial tibial plateau: Grade 0 Anterior cruciate ligament:stable Posterior cruciate ligament:stable Lateral meniscus: Evidence of previous all inside repair of a red zone vertical posterior horn tear.  However, on examination and probing today this had not healed.  It was quite unstable and we were able to pull the posterior horn anterior into the joint.  Therefore this continued to be an unstable vertical tear of the lateral meniscus of the entire posterior horn.   Lateral femoral condyle flexion bearing surface: Grade 0 Lateral femoral condyle extension bearing surface: Grade 0 Lateral tibial plateau: Grade 0  Partial lateral meniscectomy was carried out combination of biter and shaver.  We removed the entire posterior horn for a total of about 35% of the lateral meniscus given that we did have to trim around the popliteal hiatus as well.  After completion of synovectomy, diagnostic exam, and debridements as described, all compartments were checked and no residual debris remained. Hemostasis was achieved with the cautery wand. The portals were approximated with buried monocryl. All excess fluid was expressed from the joint.  Xeroform sterile gauze dressings were applied followed by Ace bandage and ice pack.   DISPOSITION: The patient was awakened from general anesthetic, extubated, taken to the recovery room in medically stable condition, no apparent complications. The patient may be weightbearing as tolerated to the operative lower extremity.  Range of motion of left knee as tolerated.

## 2022-04-20 NOTE — Anesthesia Procedure Notes (Signed)
Procedure Name: LMA Insertion Date/Time: 04/20/2022 7:27 AM  Performed by: Garrel Ridgel, CRNAPre-anesthesia Checklist: Patient identified, Emergency Drugs available, Suction available and Patient being monitored Patient Re-evaluated:Patient Re-evaluated prior to induction Oxygen Delivery Method: Circle system utilized Preoxygenation: Pre-oxygenation with 100% oxygen Induction Type: IV induction Ventilation: Mask ventilation without difficulty LMA: LMA inserted LMA Size: 4.0 Tube type: Oral Number of attempts: 1 Placement Confirmation: positive ETCO2 and breath sounds checked- equal and bilateral Tube secured with: Tape Dental Injury: Teeth and Oropharynx as per pre-operative assessment

## 2022-04-20 NOTE — Anesthesia Preprocedure Evaluation (Signed)
Anesthesia Evaluation  Patient identified by MRN, date of birth, ID band Patient awake    Reviewed: Allergy & Precautions, NPO status , Patient's Chart, lab work & pertinent test results  Airway Mallampati: II  TM Distance: >3 FB Neck ROM: Full    Dental no notable dental hx.    Pulmonary neg pulmonary ROS, Patient abstained from smoking., former smoker,    Pulmonary exam normal breath sounds clear to auscultation       Cardiovascular negative cardio ROS Normal cardiovascular exam Rhythm:Regular Rate:Normal     Neuro/Psych Bipolar Disorder negative neurological ROS     GI/Hepatic negative GI ROS, (+)     substance abuse  ,   Endo/Other  negative endocrine ROS  Renal/GU negative Renal ROS  negative genitourinary   Musculoskeletal negative musculoskeletal ROS (+)   Abdominal   Peds negative pediatric ROS (+)  Hematology negative hematology ROS (+)   Anesthesia Other Findings   Reproductive/Obstetrics negative OB ROS                             Anesthesia Physical Anesthesia Plan  ASA: 2  Anesthesia Plan: General   Post-op Pain Management: Minimal or no pain anticipated and Toradol IV (intra-op)*   Induction: Intravenous  PONV Risk Score and Plan: 3 and Ondansetron, Dexamethasone and Treatment may vary due to age or medical condition  Airway Management Planned: LMA  Additional Equipment:   Intra-op Plan:   Post-operative Plan: Extubation in OR  Informed Consent: I have reviewed the patients History and Physical, chart, labs and discussed the procedure including the risks, benefits and alternatives for the proposed anesthesia with the patient or authorized representative who has indicated his/her understanding and acceptance.     Dental advisory given  Plan Discussed with: CRNA and Surgeon  Anesthesia Plan Comments:         Anesthesia Quick Evaluation

## 2022-04-20 NOTE — Brief Op Note (Signed)
04/20/2022  8:11 AM  PATIENT:  Alicia Logan  51 y.o. female  PRE-OPERATIVE DIAGNOSIS:  Left knee medial meniscal tear  POST-OPERATIVE DIAGNOSIS:  Left knee Lateral meniscal tear  PROCEDURE:  Procedure(s): KNEE ARTHROSCOPY WITH PARTIAL  lateral MENISECTOMY (Left)  SURGEON:  Surgeon(s) and Role:    * Nicholes Stairs, MD - Primary  PHYSICIAN ASSISTANT: Jonelle Sidle, PA-C  ANESTHESIA:   local and general  EBL:  5 cc  BLOOD ADMINISTERED:none  DRAINS: none   LOCAL MEDICATIONS USED:  MARCAINE     SPECIMEN:  No Specimen  DISPOSITION OF SPECIMEN:  N/A  COUNTS:  YES  TOURNIQUET:  * No tourniquets in log *  DICTATION: .Note written in EPIC  PLAN OF CARE: Discharge to home after PACU  PATIENT DISPOSITION:  PACU - hemodynamically stable.   Delay start of Pharmacological VTE agent (>24hrs) due to surgical blood loss or risk of bleeding: not applicable

## 2022-04-20 NOTE — H&P (Signed)
ORTHOPAEDIC H&P  REQUESTING PHYSICIAN: Nicholes Stairs, MD  PCP:  Patient, No Pcp Per  Chief Complaint: Left knee medial meniscus tear  HPI: Alicia Logan is a 51 y.o. female who complains of left knee pain and mechanical symptoms.  Here today for arthroscopic surgery to treat the above.  No new complaints at this time.  She continues with mechanical symptoms and fell and swelling and pain.  Past Medical History:  Diagnosis Date   Anxiety    Arthritis    Bipolar 1 disorder ([place])    Chronic back pain    Chronic diarrhea 08/2020   Depression    Headache    Substance abuse [facility])    Past Surgical History:  Procedure Laterality Date   ARTHROSCOPIC REPAIR PCL     DENTAL SURGERY     NASAL SEPTUM SURGERY  07/2017   SPINAL CORD STIMULATOR INSERTION N/A 05/18/2016   Procedure: LUMBAR SPINAL CORD STIMULATOR INSERTION;  Surgeon: Melina Schools, MD;  Location: [place];  Service: Orthopedics;  Laterality: N/A;   Social History   Socioeconomic History   Marital status: Single    Spouse name: Not on file   Number of children: Not on file   Years of education: Not on file   Highest education level: Not on file  Occupational History   Occupation: IT consultant  Tobacco Use   Smoking status: Former    Packs/day: 0.50    Years: 20.00    Total pack years: 10.00    Types: Cigarettes    Passive exposure: Past   Smokeless tobacco: Never   Tobacco comments:    1/2 pack per day  Vaping Use   Vaping Use: Every day   Substances: Nicotine  Substance and Sexual Activity   Alcohol use: No    Alcohol/week: 5.0 standard drinks of alcohol    Types: 5 Cans of beer per week    Comment: sober for 5 years   Drug use: Yes    Types: Marijuana    Comment: occ   Sexual activity: Not Currently    Birth control/protection: Injection, Abstinence  Other Topics Concern   Not on file  Social History Narrative   Not on file   Social Determinants of Health   Financial Resource  Strain: Not on file  Food Insecurity: Not on file  Transportation Needs: Not on file  Physical Activity: Not on file  Stress: Not on file  Social Connections: Not on file   Family History  Problem Relation Age of Onset   Depression Mother    Hypertension Father    Heart failure Father    Stroke Father    Colon cancer Maternal Grandmother    Esophageal cancer Maternal Grandfather    Allergies  Allergen Reactions   Singulair [Montelukast] Other (See Comments)    Syncope when used with her psych meds   Neomycin Rash   Prograf [Tacrolimus] Rash   Sulfa Antibiotics Nausea And Vomiting and Rash   Prior to Admission medications   Medication Sig Start Date End Date Taking? Authorizing Provider  acetaminophen (TYLENOL) 500 MG tablet Take 1,000 mg by mouth every 6 (six) hours as needed for mild pain or moderate pain.   Yes [provider]  buPROPion (WELLBUTRIN XL) 150 MG 24 hr tablet Take 150 mg by mouth every morning. 04/06/22  Yes [provider]  diphenoxylate-atropine (LOMOTIL) 2.5-0.025 MG tablet TAKE 1 TO 2 TABLETS BY MOUTH FOUR TIMES A DAY AS NEEDED DECREASE AS  DIRECTED Patient taking differently: Take 1-2 tablets by mouth 4 (four) times daily as needed for diarrhea or loose stools. 08/10/21  Yes Cirigliano, Vito V, DO  gabapentin (NEURONTIN) 300 MG capsule Take 1 capsule (300 mg total) by mouth 3 (three) times daily. 07/04/16  Yes Kerrie Buffalo, NP  guanFACINE (INTUNIV) 2 MG TB24 ER tablet Take 2 mg by mouth daily. 03/28/22  Yes [provider]  lurasidone (LATUDA) 40 MG TABS tablet Take 40 mg by mouth at bedtime.   Yes [provider]  QUEtiapine (SEROQUEL) 25 MG tablet Take 25 mg by mouth at bedtime.   Yes [provider]  EPINEPHrine 0.3 mg/0.3 mL IJ SOAJ injection Inject 0.3 mg into the muscle as needed for anaphylaxis.    [provider]  medroxyPROGESTERone (DEPO-PROVERA) 150 MG/ML injection Inject 1 mL (150 mg total) into  the muscle every 3 (three) months. 03/16/22   Griffin Basil, MD   No results found.  Positive ROS: All other systems have been reviewed and were otherwise negative with the exception of those mentioned in the HPI and as above.  Physical Exam: General: Alert, no acute distress Cardiovascular: No pedal edema Respiratory: No cyanosis, no use of accessory musculature GI: No organomegaly, abdomen is soft and non-tender Skin: No lesions in the area of chief complaint Neurologic: Sensation intact distally Psychiatric: Patient is competent for consent with normal mood and affect Lymphatic: No axillary or cervical lymphadenopathy  MUSCULOSKELETAL: Left lower extremity is warm and well-perfused with no open wounds or lesions.  Assessment: Left knee medial meniscus tear, initial encounter  Plan: Plan to proceed today with arthroscopic intervention on the left knee with arthroscopic partial medial meniscectomy.  No new complaints at this time.  We again reviewed the risk of bleeding, infection, damage to surrounding nerves and vessels, stiffness, failure of pain relief, need for further surgery as well as risk of DVT and anesthesia.  She has provided informed consent.  - dc home post op    Nicholes Stairs, MD Cell [phone]    04/20/2022 7:14 AM

## 2022-04-20 NOTE — Anesthesia Postprocedure Evaluation (Signed)
Anesthesia Post Note  Patient: DYMOND GUTT  Procedure(s) Performed: KNEE ARTHROSCOPY WITH PARTIAL  lateral MENISECTOMY (Left: Knee)     Patient location during evaluation: PACU Anesthesia Type: General Level of consciousness: awake and alert Pain management: pain level controlled Vital Signs Assessment: post-procedure vital signs reviewed and stable Respiratory status: spontaneous breathing, nonlabored ventilation, respiratory function stable and patient connected to nasal cannula oxygen Cardiovascular status: blood pressure returned to baseline and stable Postop Assessment: no apparent nausea or vomiting Anesthetic complications: no   No notable events documented.  Last Vitals:  Vitals:   04/20/22 0830 04/20/22 0845  BP: 115/75 (!) 130/90  Pulse: 69 63  Resp: 12 11  Temp:    SpO2: 100% 99%    Last Pain:  Vitals:   04/20/22 0830  TempSrc:   PainSc: 5                  Lailee Hoelzel S

## 2022-04-20 NOTE — Discharge Instructions (Signed)

## 2022-04-20 NOTE — Transfer of Care (Signed)
Immediate Anesthesia Transfer of Care Note  Patient: Alicia Logan  Procedure(s) Performed: KNEE ARTHROSCOPY WITH PARTIAL  lateral MENISECTOMY (Left: Knee)  Patient Location: PACU  Anesthesia Type:General  Level of Consciousness: sedated  Airway & Oxygen Therapy: Patient Spontanous Breathing and Patient connected to face mask oxygen  Post-op Assessment: Report given to RN and Post -op Vital signs reviewed and stable  Post vital signs: Reviewed and stable  Last Vitals:  Vitals Value Taken Time  BP 107/72 04/20/22 0809  Temp    Pulse 58 04/20/22 0811  Resp 10 04/20/22 0811  SpO2 100 % 04/20/22 0811  Vitals shown include unvalidated device data.  Last Pain:  Vitals:   04/20/22 0551  TempSrc: Oral  PainSc:          Complications: No notable events documented.

## 2022-04-21 ENCOUNTER — Encounter (HOSPITAL_COMMUNITY): Payer: Self-pay | Admitting: Orthopedic Surgery

## 2022-06-01 ENCOUNTER — Ambulatory Visit: Payer: Medicare PPO

## 2022-06-02 ENCOUNTER — Ambulatory Visit (INDEPENDENT_AMBULATORY_CARE_PROVIDER_SITE_OTHER): Payer: Medicare PPO | Admitting: *Deleted

## 2022-06-02 VITALS — BP 146/90 | HR 48

## 2022-06-02 DIAGNOSIS — Z3042 Encounter for surveillance of injectable contraceptive: Secondary | ICD-10-CM | POA: Diagnosis not present

## 2022-06-02 MED ORDER — MEDROXYPROGESTERONE ACETATE 150 MG/ML IM SUSP
150.0000 mg | Freq: Once | INTRAMUSCULAR | Status: AC
  Administered 2022-06-02: 150 mg via INTRAMUSCULAR

## 2022-06-02 NOTE — Progress Notes (Signed)
Date last pap: 11/16/21. Last Depo-Provera: 03/16/25. Side Effects if any: NA. Serum HCG indicated? NA. Depo-Provera 150 mg IM given by: Harrel Lemon, RNC in RUO glut. Next appointment due 08/18/22-09/01/22.

## 2022-06-11 ENCOUNTER — Other Ambulatory Visit: Payer: Self-pay | Admitting: Obstetrics and Gynecology

## 2022-08-22 ENCOUNTER — Ambulatory Visit (INDEPENDENT_AMBULATORY_CARE_PROVIDER_SITE_OTHER): Payer: Medicare PPO | Admitting: Emergency Medicine

## 2022-08-22 VITALS — BP 138/93 | HR 97 | Wt 156.9 lb

## 2022-08-22 DIAGNOSIS — Z3042 Encounter for surveillance of injectable contraceptive: Secondary | ICD-10-CM

## 2022-08-22 MED ORDER — MEDROXYPROGESTERONE ACETATE 150 MG/ML IM SUSP
150.0000 mg | Freq: Once | INTRAMUSCULAR | Status: AC
  Administered 2022-08-22: 150 mg via INTRAMUSCULAR

## 2022-08-22 NOTE — Progress Notes (Signed)
Date last pap: 10/27/2021. Last Depo-Provera: 06/02/2022. Side Effects if any: None. Serum HCG indicated? NA. Depo-Provera 150 mg IM given by: Corinna Lines, RN into RUQ, tolerated well. Next appointment due April 17-May 1.

## 2022-10-16 ENCOUNTER — Ambulatory Visit: Payer: Medicare PPO | Admitting: Urology

## 2022-10-16 VITALS — BP 132/83 | HR 84 | Ht 69.0 in | Wt 151.0 lb

## 2022-10-16 DIAGNOSIS — R32 Unspecified urinary incontinence: Secondary | ICD-10-CM | POA: Diagnosis not present

## 2022-10-16 LAB — URINALYSIS, COMPLETE
Bilirubin, UA: NEGATIVE
Glucose, UA: NEGATIVE
Ketones, UA: NEGATIVE
Leukocytes,UA: NEGATIVE
Nitrite, UA: NEGATIVE
Protein,UA: NEGATIVE
RBC, UA: NEGATIVE
Specific Gravity, UA: <1.005 — ABNORMAL LOW (ref 1.005–1.030)
Urobilinogen, Ur: 0.2 mg/dL (ref 0.2–1.0)
pH, UA: 6 (ref 5.0–7.5)

## 2022-10-16 LAB — MICROSCOPIC EXAMINATION

## 2022-10-16 MED ORDER — MIRABEGRON ER 50 MG PO TB24: 50.0000 mg | ORAL_TABLET | Freq: Every day | ORAL | 0 refills | Status: DC

## 2022-10-16 MED ORDER — NITROFURANTOIN MONOHYD MACRO 100 MG PO CAPS: 100.0000 mg | ORAL_CAPSULE | Freq: Two times a day (BID) | ORAL | 0 refills | Status: DC

## 2022-10-16 NOTE — Progress Notes (Signed)
10/16/2022 9:29 AM   Alicia Logan [birth year] [id number]  Referring provider: No referring provider defined for this encounter.  Chief Complaint  Patient presents with   New Patient (Initial Visit)   Urinary Incontinence    HPI: I was consulted to assess the patient's urinary incontinence worse in last several weeks.  She has urge incontinence especially when she drives from [place] early in the morning to [facility] to [place].  She has to pull over.  She wonders if it is due to coffee which she is reduced.  Her bladder may have been more urgent 6 months ago but she has been going through a lot with anxiety and knee issues.  She is also having a 2-year history of diarrhea which is upsetting  She wears 1 pad a day when she goes out in public and it can be damp.  She does not have stress incontinence with Pilates coughing sneezing or bending.  No bedwetting  Flow is reasonable.  She does not necessarily feel empty.  She double voids a small amount.  She will feel urgency and void a small amount.  She has a spinal cord stimulator for low back pain and has had a back procedure and is not using the stimulator  No cystitis symptoms  No hysterectomy  No treatment.   PMH: Past Medical History:  Diagnosis Date   Anxiety    Arthritis    Bipolar 1 disorder ([place])    Chronic back pain    Chronic diarrhea 08/2020   Depression    Headache    Substance abuse [facility])     Surgical History: Past Surgical History:  Procedure Laterality Date   ARTHROSCOPIC REPAIR PCL     DENTAL SURGERY     KNEE ARTHROSCOPY WITH LATERAL MENISECTOMY Left 04/20/2022   Procedure: KNEE ARTHROSCOPY WITH PARTIAL  lateral MENISECTOMY;  Surgeon: Nicholes Stairs, MD;  Location: [facility];  Service: Orthopedics;  Laterality: Left;   NASAL SEPTUM SURGERY  07/2017   SPINAL CORD STIMULATOR INSERTION N/A 05/18/2016   Procedure: LUMBAR SPINAL CORD STIMULATOR INSERTION;  Surgeon: Melina Schools, MD;  Location:  [place];  Service: Orthopedics;  Laterality: N/A;    Home Medications:  Allergies as of 10/16/2022       Reactions   Singulair [montelukast] Other (See Comments)   Syncope when used with her psych meds   Neomycin Rash   Prograf [tacrolimus] Rash   Sulfa Antibiotics Nausea And Vomiting, Rash        Medication List        Accurate as of October 16, 2022  9:29 AM. If you have any questions, ask your nurse or doctor.          acetaminophen 500 MG tablet Commonly known as: TYLENOL Take 1,000 mg by mouth every 6 (six) hours as needed for mild pain or moderate pain.   buPROPion 150 MG 24 hr tablet Commonly known as: WELLBUTRIN XL Take 150 mg by mouth every morning.   diphenoxylate-atropine 2.5-0.025 MG tablet Commonly known as: LOMOTIL TAKE 1 TO 2 TABLETS BY MOUTH FOUR TIMES A DAY AS NEEDED DECREASE AS DIRECTED What changed:  how much to take how to take this when to take this reasons to take this additional instructions   EPINEPHrine 0.3 mg/0.3 mL Soaj injection Commonly known as: EPI-PEN Inject 0.3 mg into the muscle as needed for anaphylaxis.   gabapentin 300 MG capsule Commonly known as: NEURONTIN Take 1 capsule (300 mg total) by  mouth 3 (three) times daily.   guanFACINE 2 MG Tb24 ER tablet Commonly known as: INTUNIV Take 2 mg by mouth daily.   lurasidone 40 MG Tabs tablet Commonly known as: LATUDA Take 40 mg by mouth at bedtime.   medroxyPROGESTERone 150 MG/ML injection Commonly known as: DEPO-PROVERA INJECT 1 ML (150 MG TOTAL) INTO THE MUSCLE EVERY 3 (THREE) MONTHS   ondansetron 4 MG tablet Commonly known as: Zofran Take 1 tablet (4 mg total) by mouth every 8 (eight) hours as needed for nausea or vomiting.   QUEtiapine 25 MG tablet Commonly known as: SEROQUEL Take 25 mg by mouth at bedtime.   traMADol 50 MG tablet Commonly known as: Ultram Take 1 tablet (50 mg total) by mouth every 6 (six) hours as needed for moderate pain.        Allergies:   Allergies  Allergen Reactions   Singulair [Montelukast] Other (See Comments)    Syncope when used with her psych meds   Neomycin Rash   Prograf [Tacrolimus] Rash   Sulfa Antibiotics Nausea And Vomiting and Rash    Family History: Family History  Problem Relation Age of Onset   Depression Mother    Hypertension Father    Heart failure Father    Stroke Father    Colon cancer Maternal Grandmother    Esophageal cancer Maternal Grandfather     Social History:  reports that she has quit smoking. Her smoking use included cigarettes. She has a 10.00 pack-year smoking history. She has been exposed to tobacco smoke. She has never used smokeless tobacco. She reports current drug use. Drug: Marijuana. She reports that she does not drink alcohol.  ROS:                                        Physical Exam: BP 132/83   Pulse 84   Ht 5\' 9"  (1.753 m)   Wt 68.5 kg   BMI 22.30 kg/m   Constitutional:  Alert and oriented, No acute distress. HEENT: [place] AT, moist mucus membranes.  Trachea midline, no masses. Cardiovascular: No clubbing, cyanosis, or edema. Respiratory: Normal respiratory effort, no increased work of breathing. GI: Abdomen is soft, nontender, nondistended, no abdominal masses GU: On pelvic examination patient had mild grade 2 hypermobility bladder neck and a mild grade 1 cystocele asymptomatic.  No stress incontinence. Skin: No rashes, bruises or suspicious lesions. Lymph: No cervical or inguinal adenopathy. Neurologic: Grossly intact, no focal deficits, moving all 4 extremities. Psychiatric: Normal mood and affect.  Laboratory Data: Lab Results  Component Value Date   WBC 7.2 04/07/2022   HGB 12.2 04/07/2022   HCT 36.9 04/07/2022   MCV 94.4 04/07/2022   PLT 192 04/07/2022    Lab Results  Component Value Date   CREATININE 0.96 04/08/2021    No results found for: "PSA"  No results found for: "TESTOSTERONE"  Lab Results  Component Value  Date   HGBA1C 5.2 07/01/2016    Urinalysis    Component Value Date/Time   COLORURINE YELLOW 07/01/2016 1315   APPEARANCEUR CLEAR 07/01/2016 1315   LABSPEC 1.026 07/01/2016 1315   PHURINE 6.0 07/01/2016 1315   GLUCOSEU NEGATIVE 07/01/2016 1315   HGBUR NEGATIVE 07/01/2016 1315   BILIRUBINUR NEGATIVE 07/01/2016 1315   KETONESUR 20 (A) 07/01/2016 1315   PROTEINUR NEGATIVE 07/01/2016 1315   UROBILINOGEN 1.0 12/02/2012 1003   NITRITE NEGATIVE 07/01/2016 1315  LEUKOCYTESUR NEGATIVE 07/01/2016 1315    Pertinent Imaging: Urine reviewed.  Chart reviewed.  Urine sent for culture  Assessment & Plan: Patient has frequency and urgency and some urge incontinence especially in last few weeks.  The patient may have a bladder infection based upon urinalysis and will call if culture positive.  If this was the diagnosis she could stop all therapy.  The role of medical and behavioral therapy discussed in detail as well as future cystoscopy for safety reasons.  No blood in urine today  The patient is actually leaving work because of the urgency is so severe and going to public restrooms because of a going restroom.  She wanted me to call in Macrodantin 100 mg twice a day for 7 days.  Call if culture differs.  Of course if this works she will take the Myrbetriq 50 mg samples and prescription.  I will also cancel the cystoscopy in 6 weeks if she has normalized and had a positive UTI  1. Urinary incontinence, unspecified type  - Urinalysis, Complete   No follow-ups on file.  Reece Packer, MD  [facility] [address], [place], [place] [address] [phone]

## 2022-10-21 LAB — CULTURE, URINE COMPREHENSIVE

## 2022-11-08 ENCOUNTER — Other Ambulatory Visit: Payer: Self-pay | Admitting: *Deleted

## 2022-11-08 DIAGNOSIS — R32 Unspecified urinary incontinence: Secondary | ICD-10-CM

## 2022-11-08 MED ORDER — MIRABEGRON ER 50 MG PO TB24: 50.0000 mg | ORAL_TABLET | Freq: Every day | ORAL | 11 refills | Status: DC

## 2022-11-09 ENCOUNTER — Other Ambulatory Visit: Payer: Self-pay | Admitting: Family Medicine

## 2022-11-13 ENCOUNTER — Other Ambulatory Visit: Payer: Self-pay

## 2022-11-13 DIAGNOSIS — Z3042 Encounter for surveillance of injectable contraceptive: Secondary | ICD-10-CM

## 2022-11-13 MED ORDER — MEDROXYPROGESTERONE ACETATE 150 MG/ML IM SUSP: 150.0000 mg | INTRAMUSCULAR | 0 refills | Status: DC

## 2022-11-14 ENCOUNTER — Ambulatory Visit: Payer: Medicare PPO

## 2022-11-21 ENCOUNTER — Ambulatory Visit (INDEPENDENT_AMBULATORY_CARE_PROVIDER_SITE_OTHER): Payer: Medicare PPO

## 2022-11-21 DIAGNOSIS — Z3042 Encounter for surveillance of injectable contraceptive: Secondary | ICD-10-CM

## 2022-11-21 MED ORDER — MEDROXYPROGESTERONE ACETATE 150 MG/ML IM SUSP
150.0000 mg | Freq: Once | INTRAMUSCULAR | Status: AC
  Administered 2022-11-21: 150 mg via INTRAMUSCULAR

## 2022-11-21 MED ORDER — MEDROXYPROGESTERONE ACETATE 150 MG/ML IM SUSP: 150.0000 mg | INTRAMUSCULAR | 0 refills | Status: DC

## 2022-11-21 NOTE — Progress Notes (Signed)
Subjective:  Pt in for Depo Provera injection.    Objective: Need for contraception. No unusual complaints.    Assessment: Depo given R upper outer quadrant. Pt tolerated Depo injection.   Plan:  Next injection due July 16-30.   Schedule annual at checkout

## 2022-12-04 ENCOUNTER — Ambulatory Visit: Payer: Medicare PPO | Admitting: Urology

## 2022-12-04 VITALS — BP 123/78 | HR 87 | Ht 69.0 in | Wt 157.0 lb

## 2022-12-04 DIAGNOSIS — N3941 Urge incontinence: Secondary | ICD-10-CM

## 2022-12-04 DIAGNOSIS — R32 Unspecified urinary incontinence: Secondary | ICD-10-CM | POA: Diagnosis not present

## 2022-12-04 MED ORDER — GEMTESA 75 MG PO TABS: 1.0000 | ORAL_TABLET | Freq: Every day | ORAL | 11 refills | Status: DC

## 2022-12-04 NOTE — Progress Notes (Signed)
12/04/2022 1:29 PM   Jodi Mourning [birth year] [id number]  Referring provider: No referring provider defined for this encounter.  Chief Complaint  Patient presents with   Cysto    HPI: I was consulted to assess the patient's urinary incontinence worse in last several weeks.  She has urge incontinence especially when she drives from [place] early in the morning to [facility] to [place].  She has to pull over.  She wonders if it is due to coffee which she is reduced.  Her bladder may have been more urgent 6 months ago but she has been going through a lot with anxiety and knee issues.  She is also having a 2-year history of diarrhea which is upsetting   She wears 1 pad a day when she goes out in public and it can be damp.  She does not have stress incontinence with Pilates coughing sneezing or bending.  No bedwetting   Flow is reasonable.  She does not necessarily feel empty.  She double voids a small amount.  She will feel urgency and void a small amount.   She has a spinal cord stimulator for low back pain and has had a back procedure and is not using the stimulator   On pelvic examination patient had mild grade 2 hypermobility bladder neck and a mild grade 1 cystocele asymptomatic. No stress incontinence.   Patient has frequency and urgency and some urge incontinence especially in last few weeks.  The patient may have a bladder infection based upon urinalysis and will call if culture positive.  If this was the diagnosis she could stop all therapy.  The role of medical and behavioral therapy discussed in detail as well as future cystoscopy for safety reasons.  No blood in urine today   The patient is actually leaving work because of the urgency is so severe and going to public restrooms because of a going restroom.  She wanted me to call in Macrodantin 100 mg twice a day for 7 days.  Call if culture differs.  Of course if this works she will take the Myrbetriq 50 mg samples and  prescription.  I will also cancel the cystoscopy in 6 weeks if she has normalized and had a positive UTI  Today Frequency stable.  Culture negative. Patient was doing much better on Myrbetriq but efficacy decreased.  Still having a little bit of leaking but is overall doing better. No stress incontinence and repeat pelvic examination Cystoscopy: Patient underwent flexible cystoscopy.  Bladder mucosa and trigone were normal.  No cystitis.  No carcinoma.      PMH: Past Medical History:  Diagnosis Date   Anxiety    Arthritis    Bipolar 1 disorder ([facility])    Chronic back pain    Chronic diarrhea 08/2020   Depression    Headache    Substance abuse [facility])     Surgical History: Past Surgical History:  Procedure Laterality Date   ARTHROSCOPIC REPAIR PCL     DENTAL SURGERY     KNEE ARTHROSCOPY WITH LATERAL MENISECTOMY Left 04/20/2022   Procedure: KNEE ARTHROSCOPY WITH PARTIAL  lateral MENISECTOMY;  Surgeon: Yolonda Kida, MD;  [facility]: [facility];  Service: Orthopedics;  Laterality: Left;   NASAL SEPTUM SURGERY  07/2017   SPINAL CORD STIMULATOR INSERTION N/A 05/18/2016   Procedure: LUMBAR SPINAL CORD STIMULATOR INSERTION;  Surgeon: Venita Lick, MD;  Location: [facility];  Service: Orthopedics;  Laterality: N/A;    Home Medications:  Allergies as  of 12/04/2022       Reactions   Singulair [montelukast] Other (See Comments)   Syncope when used with her psych meds   Neomycin Rash   Prograf [tacrolimus] Rash   Sulfa Antibiotics Nausea And Vomiting, Rash        Medication List        Accurate as of Dec 04, 2022  1:29 PM. If you have any questions, ask your nurse or doctor.          STOP taking these medications    ondansetron 4 MG tablet Commonly known as: Zofran   traMADol 50 MG tablet Commonly known as: Ultram       TAKE these medications    acetaminophen 500 MG tablet Commonly known as: TYLENOL Take 1,000 mg by mouth every 6 (six) hours as needed for mild  pain or moderate pain.   buPROPion 150 MG 24 hr tablet Commonly known as: WELLBUTRIN XL Take 150 mg by mouth every morning.   diphenoxylate-atropine 2.5-0.025 MG tablet Commonly known as: LOMOTIL TAKE 1 TO 2 TABLETS BY MOUTH FOUR TIMES A DAY AS NEEDED DECREASE AS DIRECTED What changed:  how much to take how to take this when to take this reasons to take this additional instructions   EPINEPHrine 0.3 mg/0.3 mL Soaj injection Commonly known as: EPI-PEN Inject 0.3 mg into the muscle as needed for anaphylaxis.   gabapentin 300 MG capsule Commonly known as: NEURONTIN Take 1 capsule (300 mg total) by mouth 3 (three) times daily.   guanFACINE 2 MG Tb24 ER tablet Commonly known as: INTUNIV Take 2 mg by mouth daily.   lurasidone 40 MG Tabs tablet Commonly known as: LATUDA Take 40 mg by mouth at bedtime.   medroxyPROGESTERone 150 MG/ML injection Commonly known as: DEPO-PROVERA Inject 1 mL (150 mg total) into the muscle every 3 (three) months.   mirabegron ER 50 MG Tb24 tablet Commonly known as: MYRBETRIQ Take 1 tablet (50 mg total) by mouth daily.   nitrofurantoin (macrocrystal-monohydrate) 100 MG capsule Commonly known as: MACROBID Take 1 capsule (100 mg total) by mouth 2 (two) times daily.   QUEtiapine 25 MG tablet Commonly known as: SEROQUEL Take 25 mg by mouth at bedtime.        Allergies:  Allergies  Allergen Reactions   Singulair [Montelukast] Other (See Comments)    Syncope when used with her psych meds   Neomycin Rash   Prograf [Tacrolimus] Rash   Sulfa Antibiotics Nausea And Vomiting and Rash    Family History: Family History  Problem Relation Age of Onset   Depression Mother    Hypertension Father    Heart failure Father    Stroke Father    Colon cancer Maternal Grandmother    Esophageal cancer Maternal Grandfather     Social History:  reports that she has quit smoking. Her smoking use included cigarettes. She has a 10.00 pack-year smoking  history. She has been exposed to tobacco smoke. She has never used smokeless tobacco. She reports current drug use. Drug: Marijuana. She reports that she does not drink alcohol.  ROS:                                        Physical Exam: There were no vitals taken for this visit.  Constitutional:  Alert and oriented, No acute distress. HEENT: [place] AT, moist mucus membranes.  Trachea midline, no masses.  Laboratory Data: Lab Results  Component Value Date   WBC 7.2 04/07/2022   HGB 12.2 04/07/2022   HCT 36.9 04/07/2022   MCV 94.4 04/07/2022   PLT 192 04/07/2022    Lab Results  Component Value Date   CREATININE 0.96 04/08/2021    No results found for: "PSA"  No results found for: "TESTOSTERONE"  Lab Results  Component Value Date   HGBA1C 5.2 07/01/2016    Urinalysis    Component Value Date/Time   COLORURINE YELLOW 07/01/2016 1315   APPEARANCEUR Clear 10/16/2022 0921   LABSPEC 1.026 07/01/2016 1315   PHURINE 6.0 07/01/2016 1315   GLUCOSEU Negative 10/16/2022 0921   HGBUR NEGATIVE 07/01/2016 1315   BILIRUBINUR Negative 10/16/2022 0921   KETONESUR 20 (A) 07/01/2016 1315   PROTEINUR Negative 10/16/2022 0921   PROTEINUR NEGATIVE 07/01/2016 1315   UROBILINOGEN 1.0 12/02/2012 1003   NITRITE Negative 10/16/2022 0921   NITRITE NEGATIVE 07/01/2016 1315   LEUKOCYTESUR Negative 10/16/2022 0921    Pertinent Imaging:   Assessment & Plan: Reassess in 6 weeks on Gemtesa samples and prescription.  Encourage physical therapy.  We can always order urodynamics depending on her progress.  1. Urinary incontinence, unspecified type  - Urinalysis, Complete   No follow-ups on file.  Martina Sinner, MD  [facility] [address], [address] [place], [place] [address] [phone]

## 2022-12-05 LAB — URINALYSIS, COMPLETE
Bilirubin, UA: NEGATIVE
Glucose, UA: NEGATIVE
Ketones, UA: NEGATIVE
Leukocytes,UA: NEGATIVE
Nitrite, UA: NEGATIVE
Protein,UA: NEGATIVE
RBC, UA: NEGATIVE
Specific Gravity, UA: 1.02 (ref 1.005–1.030)
Urobilinogen, Ur: 0.2 mg/dL (ref 0.2–1.0)
pH, UA: 6 (ref 5.0–7.5)

## 2022-12-05 LAB — MICROSCOPIC EXAMINATION

## 2022-12-07 LAB — CULTURE, URINE COMPREHENSIVE

## 2022-12-14 IMAGING — DX DG KNEE COMPLETE 4+V*L*
4 series · 4 of 4 positions shown · non-contrast
Comparison: 08/16/2015.

CLINICAL DATA: Pt states that every 6 months or so her left knee
will " lock up" and that most of the time just standing up will put
her knee "back in place" but not this time. Pt is able to bend and
walk on left knee. States she has a hx of knee dislocations. Hx of
meniscus surgery.

EXAM:
LEFT KNEE - COMPLETE 4+ VIEW

[knee ap]
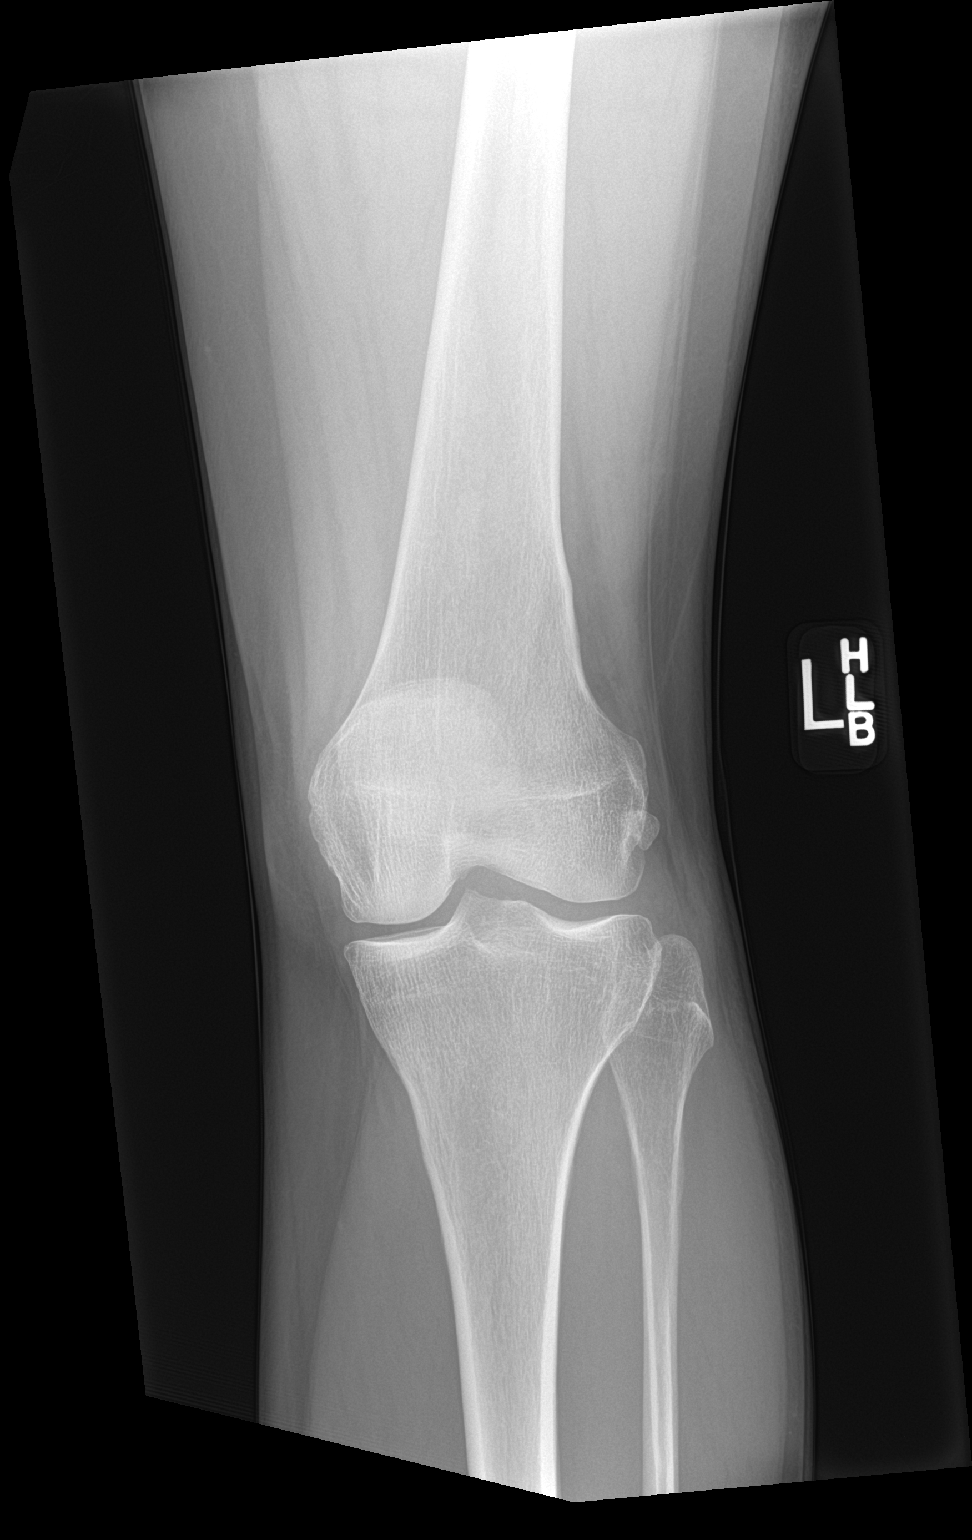

[knee lat]
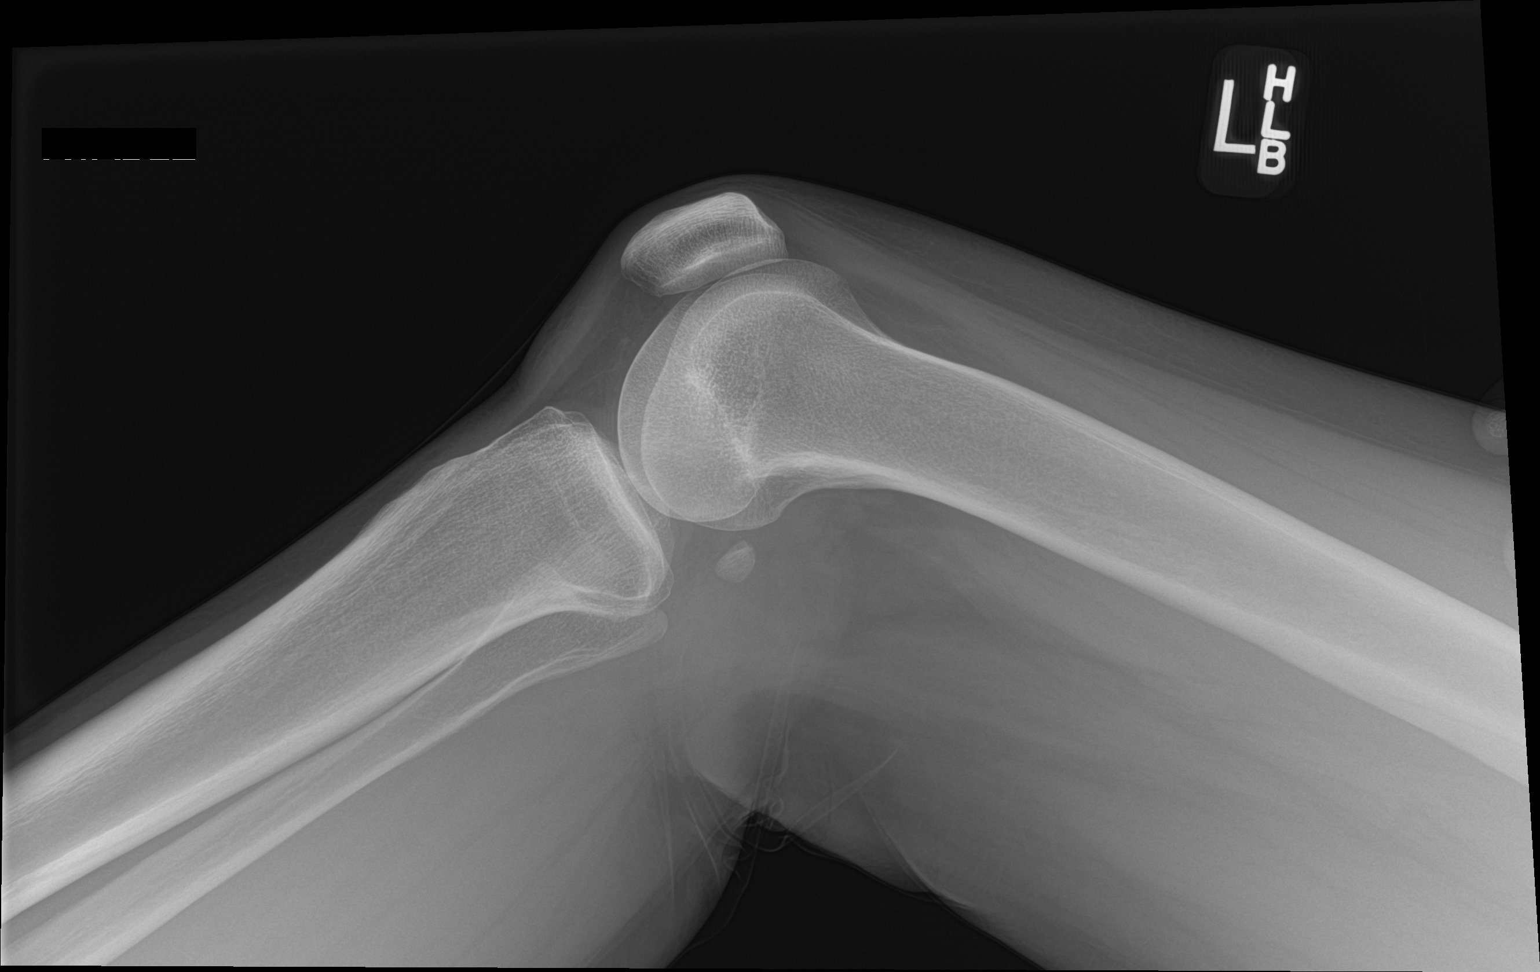

[knee obl (1 of 2)]
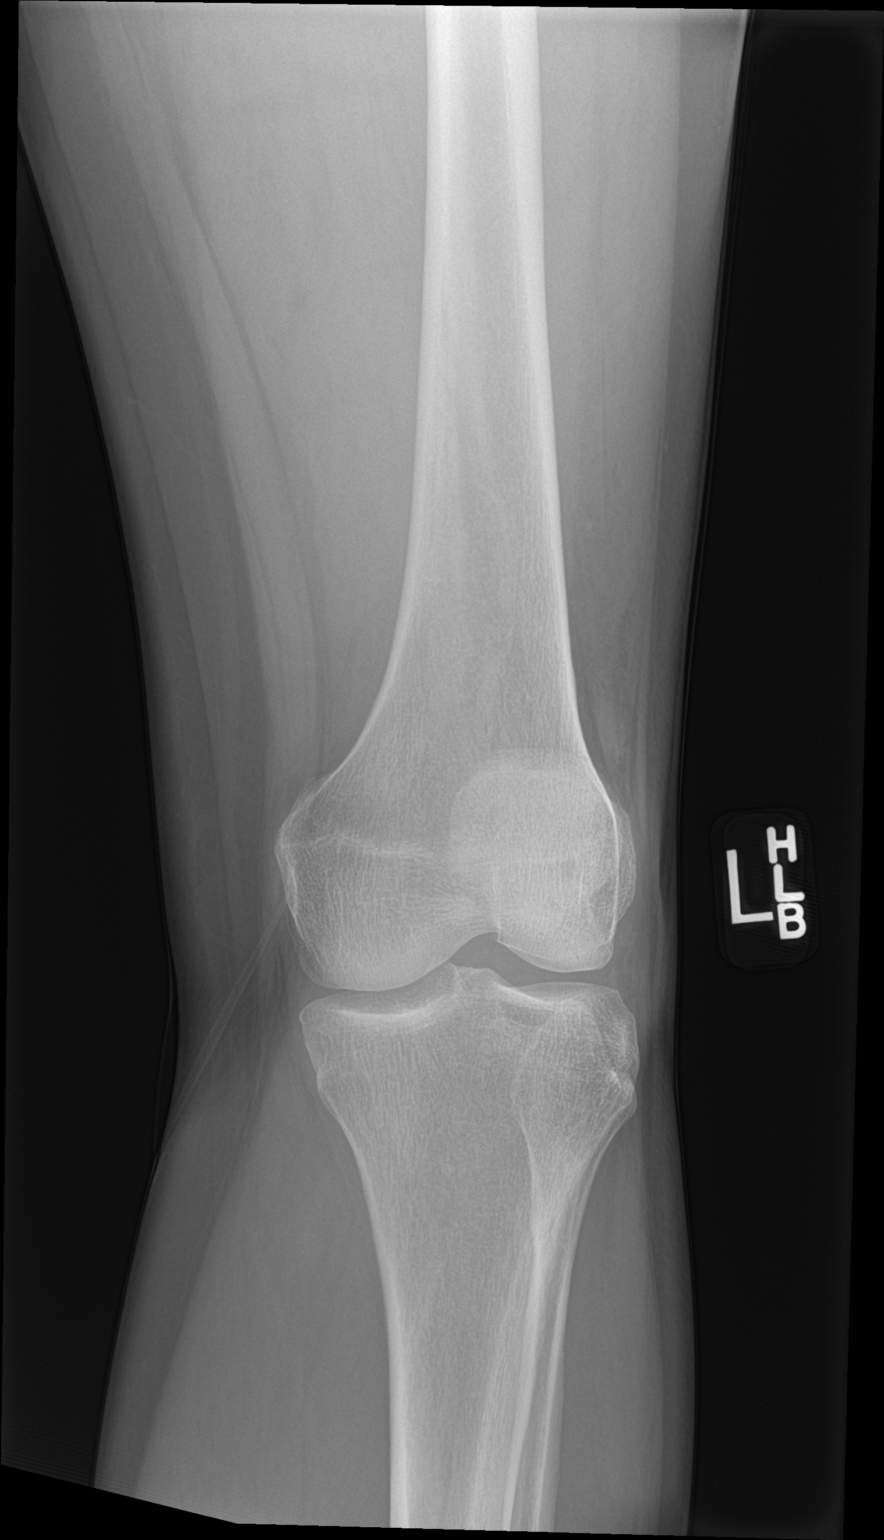

[knee obl (2 of 2)]
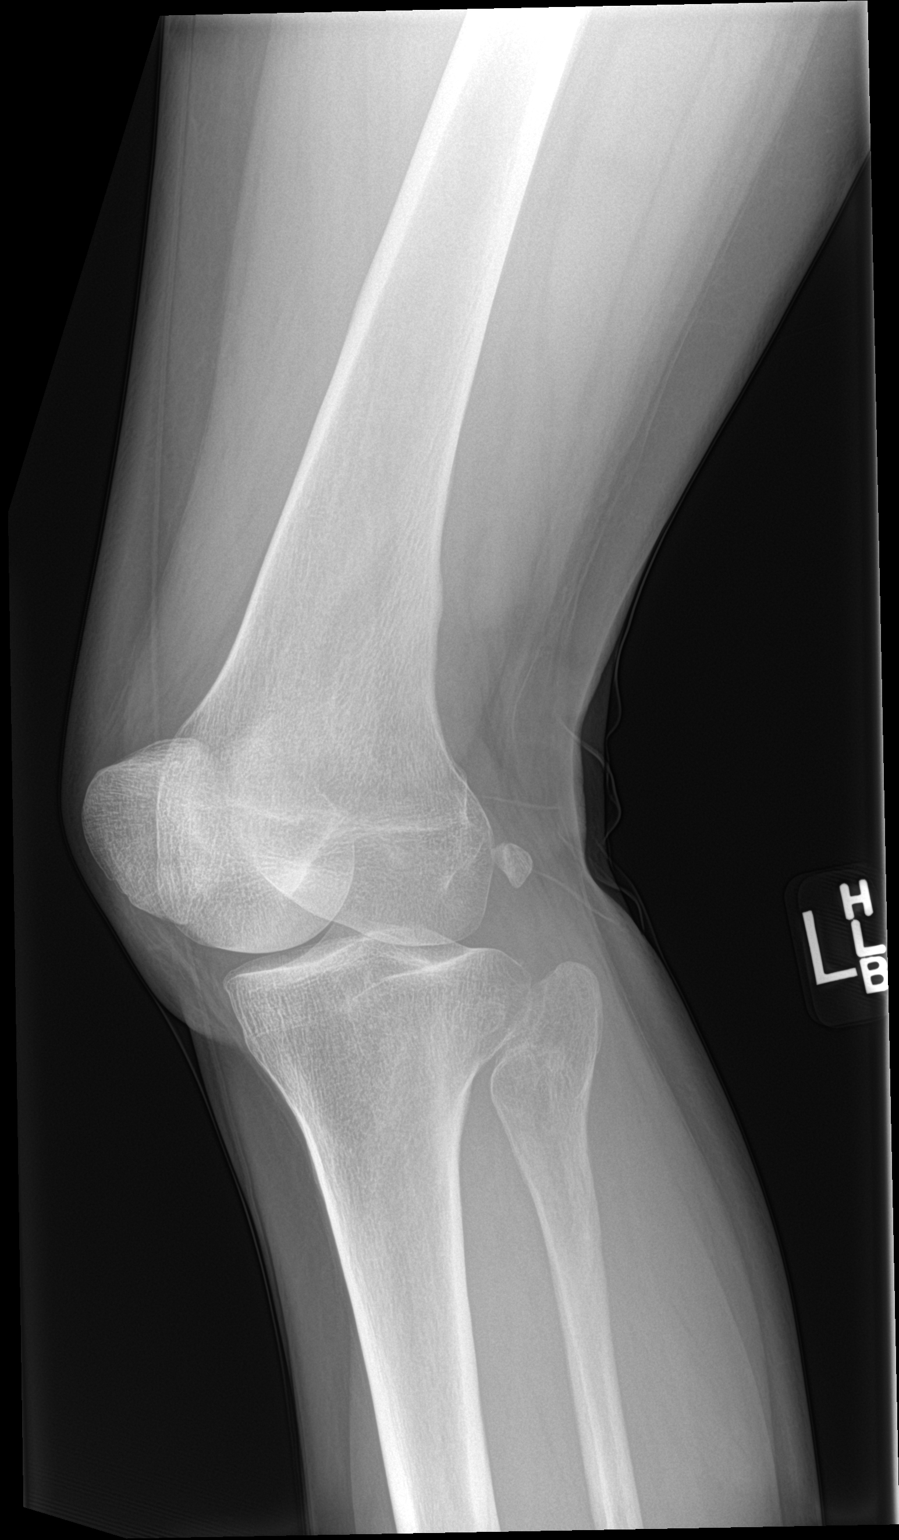

[4 of 4 positions shown; findings below may reference images not displayed]

FINDINGS: No evidence of fracture, dislocation, or joint effusion. No evidence
of arthropathy or other focal bone abnormality. Soft tissues are
unremarkable.
IMPRESSION: Negative.

## 2023-01-18 ENCOUNTER — Encounter: Payer: Self-pay | Admitting: Student

## 2023-01-18 ENCOUNTER — Ambulatory Visit (INDEPENDENT_AMBULATORY_CARE_PROVIDER_SITE_OTHER): Payer: Medicare PPO | Admitting: Student

## 2023-01-18 VITALS — BP 133/92 | HR 91 | Ht 69.0 in | Wt 147.2 lb

## 2023-01-18 DIAGNOSIS — Z01419 Encounter for gynecological examination (general) (routine) without abnormal findings: Secondary | ICD-10-CM | POA: Diagnosis not present

## 2023-01-18 DIAGNOSIS — Z3042 Encounter for surveillance of injectable contraceptive: Secondary | ICD-10-CM

## 2023-01-18 MED ORDER — MEDROXYPROGESTERONE ACETATE 150 MG/ML IM SUSP: 150.0000 mg | INTRAMUSCULAR | 3 refills | Status: AC

## 2023-01-18 NOTE — Progress Notes (Signed)
ANNUAL EXAM Patient name: [name] MRN [id number]  Date of birth: Aug 07, 1970 Chief Complaint:   No chief complaint on file.  History of Present Illness:   Alicia Logan is a 52 y.o. G0P0000 Caucasian female being seen today for a routine annual exam.  Current complaints: No GYN concerns. Off-note patient did share that she has a lot of other medical issues that she is having to manage right now. She is experiencing immense knee pain and being followed by Ortho and PT. Has initiated care with Urology for urinary leakage. Is also experiencing bowel troubles that have indicated her need to pursue a colonoscopy this year. Patient also feels her BP is elevated today due to feeling overwhelmed with managing all of her healthcare needs.   No LMP recorded. Patient has had an injection. Receiving Depo and has been on since 52 years old. Does not desire to discontinue at this time.   The pregnancy intention screening data noted above was reviewed.   Last pap 2023. Results were: NILM w/ HRHPV negative.  Last mammogram: 2022. Results were: normal.  Last colonoscopy: 03/2021. Results were: normal.       No data to display               No data to display           Review of Systems:   Pertinent items are noted in HPI Denies any headaches, blurred vision, fatigue, shortness of breath, chest pain, abdominal pain, abnormal vaginal discharge/itching/odor/irritation, problems with periods, bowel movements, urination, or intercourse unless otherwise stated above. Pertinent History Reviewed:  Reviewed past medical,surgical, social and family history.  Reviewed problem list, medications and allergies. Physical Assessment:   Vitals:   01/18/23 0813 01/18/23 0820  BP: (!) 158/99 (!) 133/92  Pulse: 99 91  Weight: 147 lb 3.2 oz (66.8 kg)   Height: 5\' 9"  (1.753 m)   Body mass index is 21.74 kg/m.        Physical Examination:   General appearance - well appearing, and in no  distress  Mental status - alert, oriented to person, place, and time  Psych:  She has a normal mood and affect  Skin - warm and dry, normal color, no suspicious lesions noted  Chest - effort normal, all lung fields clear to auscultation bilaterally  Heart - normal rate and regular rhythm  Neck:  midline trachea, no thyromegaly or nodules  Breasts - breasts appear normal, no suspicious masses, no skin or nipple changes or  axillary nodes  Abdomen - soft, nontender, nondistended, no masses or organomegaly  Pelvic - patient declined   Extremities:  No swelling or varicosities noted  Chaperone present for exam  No results found for this or any previous visit (from the past 24 hour(s)).  Assessment & Plan:  1. Women's annual routine gynecological examination - MM 3D SCREENING MAMMOGRAM BILATERAL BREAST; Future - medroxyPROGESTERone (DEPO-PROVERA) 150 MG/ML injection; Inject 1 mL (150 mg total) into the muscle every 3 (three) months.  Dispense: 1 mL; Refill: 3 - Patient has strong support system, and provider ensured patient is aware that our office is her to support her for any mental and emotional needs while she takes on the next steps   2. Surveillance for Depo-Provera contraception - Discussed option to check FSH levels prior to next injection to assess menopausal status. May require 2 FSH levels before certain conclusion for menopause can be made - Provider to coordinate lab draw for same  day as follow-up injection - Continue Depo until menopausal or 50 years old - medroxyPROGESTERone (DEPO-PROVERA) 150 MG/ML injection; Inject 1 mL (150 mg total) into the muscle every 3 (three) months.  Dispense: 1 mL; Refill: 3  Labs/procedures today: none  Mammogram: schedule screening mammo as soon as possible, or sooner if problems Colonoscopy: per GI, or sooner if problems  Orders Placed This Encounter  Procedures   MM 3D SCREENING MAMMOGRAM BILATERAL BREAST   FSH    Meds:  Meds ordered  this encounter  Medications   medroxyPROGESTERone (DEPO-PROVERA) 150 MG/ML injection    Sig: Inject 1 mL (150 mg total) into the muscle every 3 (three) months.    Dispense:  1 mL    Refill:  3    Follow-up: Return in about 1 year (around 01/18/2024), or if symptoms worsen or fail to improve, for ANN.  Corlis Hove, NP 01/18/2023 8:46 AM

## 2023-01-23 ENCOUNTER — Ambulatory Visit
Admission: RE | Admit: 2023-01-23 | Discharge: 2023-01-23 | Disposition: A | Discharge status: Home / Self Care | Payer: Medicare PPO | Source: Ambulatory Visit | Attending: Student | Admitting: Student

## 2023-01-23 DIAGNOSIS — Z01419 Encounter for gynecological examination (general) (routine) without abnormal findings: Secondary | ICD-10-CM

## 2023-01-29 ENCOUNTER — Ambulatory Visit: Payer: Medicare PPO | Admitting: Urology

## 2023-02-13 ENCOUNTER — Ambulatory Visit (INDEPENDENT_AMBULATORY_CARE_PROVIDER_SITE_OTHER): Payer: Medicare PPO

## 2023-02-13 DIAGNOSIS — Z01419 Encounter for gynecological examination (general) (routine) without abnormal findings: Secondary | ICD-10-CM

## 2023-02-13 DIAGNOSIS — Z3042 Encounter for surveillance of injectable contraceptive: Secondary | ICD-10-CM

## 2023-02-13 MED ORDER — MEDROXYPROGESTERONE ACETATE 150 MG/ML IM SUSP
150.0000 mg | INTRAMUSCULAR | Status: AC
  Administered 2023-02-13 – 2023-05-08 (×2): 150 mg via INTRAMUSCULAR

## 2023-02-13 NOTE — Progress Notes (Signed)
Pt is in the office for depo injection, administered in RUOQ per pt request and pt tolerated well. Next due Oct 8-22. Per visit note on 01/18/23, pt is also getting [facility] labs drawn today to assess menopausal status. .. Administrations This Visit     medroxyPROGESTERone (DEPO-PROVERA) injection 150 mg     Admin Date 02/13/2023 Action Given Dose 150 mg Route Intramuscular Documented By Katrina Stack, RN

## 2023-02-14 LAB — FOLLICLE STIMULATING HORMONE: FSH: 69.6 m[IU]/mL

## 2023-02-15 ENCOUNTER — Other Ambulatory Visit: Payer: Self-pay | Admitting: Student

## 2023-02-15 DIAGNOSIS — N951 Menopausal and female climacteric states: Secondary | ICD-10-CM

## 2023-02-15 DIAGNOSIS — Z3042 Encounter for surveillance of injectable contraceptive: Secondary | ICD-10-CM

## 2023-02-19 ENCOUNTER — Ambulatory Visit: Payer: Medicare PPO | Admitting: Urology

## 2023-02-19 ENCOUNTER — Encounter: Payer: Self-pay | Admitting: Urology

## 2023-02-19 VITALS — BP 151/91 | HR 91 | Wt 145.0 lb

## 2023-02-19 DIAGNOSIS — N3941 Urge incontinence: Secondary | ICD-10-CM

## 2023-02-19 LAB — URINALYSIS, COMPLETE
Bilirubin, UA: NEGATIVE
Glucose, UA: NEGATIVE
Ketones, UA: NEGATIVE
Leukocytes,UA: NEGATIVE
Nitrite, UA: NEGATIVE
Protein,UA: NEGATIVE
RBC, UA: NEGATIVE
Specific Gravity, UA: 1.015 (ref 1.005–1.030)
Urobilinogen, Ur: 0.2 mg/dL (ref 0.2–1.0)
pH, UA: 7.5 (ref 5.0–7.5)

## 2023-02-19 LAB — MICROSCOPIC EXAMINATION: Bacteria, UA: NONE SEEN

## 2023-02-19 MED ORDER — OXYBUTYNIN CHLORIDE ER 10 MG PO TB24: 10.0000 mg | ORAL_TABLET | Freq: Every day | ORAL | 3 refills | Status: DC

## 2023-02-19 NOTE — Progress Notes (Signed)
02/19/2023 8:16 AM   Alicia Logan [birth year] [id number]  Referring provider: No referring provider defined for this encounter.  No chief complaint on file.   HPI: I was consulted to assess the patient's urinary incontinence worse in last several weeks.  She has urge incontinence especially when she drives from [place] early in the morning to [facility] to Pilates.  She has to pull over.  She wonders if it is due to coffee which she is reduced.  Her bladder may have been more urgent 6 months ago but she has been going through a lot with anxiety and knee issues.  She is also having a 2-year history of diarrhea which is upsetting   She wears 1 pad a day when she goes out in public and it can be damp.  She does not have stress incontinence with Pilates coughing sneezing or bending.  No bedwetting   Flow is reasonable.  She does not necessarily feel empty.  She double voids a small amount.  She will feel urgency and void a small amount.   She has a spinal cord stimulator for low back pain and has had a back procedure and is not using the stimulator   On pelvic examination patient had mild grade 2 hypermobility bladder neck and a mild grade 1 cystocele asymptomatic. No stress incontinence.    Patient has frequency and urgency and some urge incontinence especially in last few weeks.  The patient may have a bladder infection based upon urinalysis and will call if culture positive.  If this was the diagnosis she could stop all therapy.  The role of medical and behavioral therapy discussed in detail as well as future cystoscopy for safety reasons.  No blood in urine today   The patient is actually leaving work because of the urgency is so severe and going to public restrooms because of a going restroom.  She wanted me to call in Macrodantin 100 mg twice a day for 7 days.  Call if culture differs.  Of course if this works she will take the Myrbetriq 50 mg samples and prescription.  I will also  cancel the cystoscopy in 6 weeks if she has normalized and had a positive UTI   Culture negative. Patient was doing much better on Myrbetriq but efficacy decreased.  Still having a little bit of leaking but is overall doing better. No stress incontinence and repeat pelvic examination Cystoscopy: normal  reassess in 6 weeks on Gemtesa samples and prescription.  Encourage physical therapy.  We can always order urodynamics depending on her progress.  Today Patient once again responded to the [facility] and was pleased with the samples.  She said the effects are wearing off again.  When she does a long drive she has to wear a pad or she would wet through her close.  Clinically not infected.  Frequency stable      PMH: Past Medical History:  Diagnosis Date   Anxiety    Arthritis    Bipolar 1 disorder (HCC)    Chronic back pain    Chronic diarrhea 08/2020   Depression    Headache    Substance abuse [facility])     Surgical History: Past Surgical History:  Procedure Laterality Date   ARTHROSCOPIC REPAIR PCL     DENTAL SURGERY     KNEE ARTHROSCOPY WITH LATERAL MENISECTOMY Left 04/20/2022   Procedure: KNEE ARTHROSCOPY WITH PARTIAL  lateral MENISECTOMY;  Surgeon: Yolonda Kida, MD;  Location: [facility];  Service: Orthopedics;  Laterality: Left;   NASAL SEPTUM SURGERY  07/2017   SPINAL CORD STIMULATOR INSERTION N/A 05/18/2016   Procedure: LUMBAR SPINAL CORD STIMULATOR INSERTION;  Surgeon: Venita Lick, MD;  Location: [facility] OR;  Service: Orthopedics;  Laterality: N/A;    Home Medications:  Allergies as of 02/19/2023       Reactions   Singulair [montelukast] Other (See Comments)   Syncope when used with her psych meds   Neomycin Rash   Prograf [tacrolimus] Rash   Sulfa Antibiotics Nausea And Vomiting, Rash        Medication List        Accurate as of February 19, 2023  8:16 AM. If you have any questions, ask your nurse or doctor.          acetaminophen 500 MG tablet Commonly  known as: TYLENOL Take 1,000 mg by mouth every 6 (six) hours as needed for mild pain or moderate pain.   buPROPion 150 MG 24 hr tablet Commonly known as: WELLBUTRIN XL Take 150 mg by mouth every morning.   diphenoxylate-atropine 2.5-0.025 MG tablet Commonly known as: LOMOTIL TAKE 1 TO 2 TABLETS BY MOUTH FOUR TIMES A DAY AS NEEDED DECREASE AS DIRECTED What changed:  how much to take how to take this when to take this reasons to take this additional instructions   EPINEPHrine 0.3 mg/0.3 mL Soaj injection Commonly known as: EPI-PEN Inject 0.3 mg into the muscle as needed for anaphylaxis.   gabapentin 300 MG capsule Commonly known as: NEURONTIN Take 1 capsule (300 mg total) by mouth 3 (three) times daily.   Gemtesa 75 MG Tabs Generic drug: Vibegron Take 1 tablet (75 mg total) by mouth daily.   guanFACINE 2 MG Tb24 ER tablet Commonly known as: INTUNIV Take 2 mg by mouth daily.   lurasidone 40 MG Tabs tablet Commonly known as: LATUDA Take 40 mg by mouth at bedtime.   medroxyPROGESTERone 150 MG/ML injection Commonly known as: DEPO-PROVERA Inject 1 mL (150 mg total) into the muscle every 3 (three) months.   nitrofurantoin (macrocrystal-monohydrate) 100 MG capsule Commonly known as: MACROBID Take 1 capsule (100 mg total) by mouth 2 (two) times daily.   QUEtiapine 25 MG tablet Commonly known as: SEROQUEL Take 25 mg by mouth at bedtime.        Allergies:  Allergies  Allergen Reactions   Singulair [Montelukast] Other (See Comments)    Syncope when used with her psych meds   Neomycin Rash   Prograf [Tacrolimus] Rash   Sulfa Antibiotics Nausea And Vomiting and Rash    Family History: Family History  Problem Relation Age of Onset   Depression Mother    Hypertension Father    Heart failure Father    Stroke Father    Colon cancer Maternal Grandmother    Esophageal cancer Maternal Grandfather     Social History:  reports that she has quit smoking. Her smoking  use included cigarettes. She has a 10 pack-year smoking history. She has been exposed to tobacco smoke. She has never used smokeless tobacco. She reports current drug use. Drug: Marijuana. She reports that she does not drink alcohol.  ROS:                                        Physical Exam: There were no vitals taken for this visit.  Constitutional:  Alert and oriented, No acute distress.  HEENT: [place] AT, moist mucus membranes.  Trachea midline, no masses.   Laboratory Data: Lab Results  Component Value Date   WBC 7.2 04/07/2022   HGB 12.2 04/07/2022   HCT 36.9 04/07/2022   MCV 94.4 04/07/2022   PLT 192 04/07/2022    Lab Results  Component Value Date   CREATININE 0.96 04/08/2021    No results found for: "PSA"  No results found for: "TESTOSTERONE"  Lab Results  Component Value Date   HGBA1C 5.2 07/01/2016    Urinalysis    Component Value Date/Time   COLORURINE YELLOW 07/01/2016 1315   APPEARANCEUR Clear 12/04/2022 1331   LABSPEC 1.026 07/01/2016 1315   PHURINE 6.0 07/01/2016 1315   GLUCOSEU Negative 12/04/2022 1331   HGBUR NEGATIVE 07/01/2016 1315   BILIRUBINUR Negative 12/04/2022 1331   KETONESUR 20 (A) 07/01/2016 1315   PROTEINUR Negative 12/04/2022 1331   PROTEINUR NEGATIVE 07/01/2016 1315   UROBILINOGEN 1.0 12/02/2012 1003   NITRITE Negative 12/04/2022 1331   NITRITE NEGATIVE 07/01/2016 1315   LEUKOCYTESUR Negative 12/04/2022 1331    Pertinent Imaging:   Assessment & Plan:  Reassess in 6 weeks on oxybutynin ER 10 mg 30 x 11.  I am going to have her see our physical therapy in [place].  I can always order urodynamics.  She lives in [place] and this is more convenient  There are no diagnoses linked to this encounter.  No follow-ups on file.  Martina Sinner, MD  [facility] [address] [place], [place] [address] [phone]

## 2023-02-22 ENCOUNTER — Ambulatory Visit (INDEPENDENT_AMBULATORY_CARE_PROVIDER_SITE_OTHER): Payer: Medicare PPO

## 2023-02-22 ENCOUNTER — Ambulatory Visit (INDEPENDENT_AMBULATORY_CARE_PROVIDER_SITE_OTHER): Payer: Medicare PPO | Admitting: Orthopaedic Surgery

## 2023-02-22 DIAGNOSIS — M25562 Pain in left knee: Secondary | ICD-10-CM

## 2023-02-22 DIAGNOSIS — M222X2 Patellofemoral disorders, left knee: Secondary | ICD-10-CM | POA: Diagnosis not present

## 2023-02-22 DIAGNOSIS — G8929 Other chronic pain: Secondary | ICD-10-CM

## 2023-02-22 NOTE — Progress Notes (Signed)
Chief Complaint: Left knee pain     History of Present Illness:    Alicia Logan is a 52 y.o. female presents today with ongoing left knee pain.  She states this is deep and central located the knee.  She is status post meniscectomy in 2023 with Dr. Aundria Rud.  At that time a 50% meniscal debridement was performed.  She did eventually attend physical therapy although does not believe that she did aggressively return to Pilates and full activity after this with subsequent knee pain.  She is here today for further discussion.  She is also seeking a second opinion regarding running as she is upset that she has not been able to participate in this    Surgical History:   As above  PMH/PSH/Family History/Social History/Meds/Allergies:    Past Medical History:  Diagnosis Date   Anxiety    Arthritis    Bipolar 1 disorder (HCC)    Chronic back pain    Chronic diarrhea 08/2020   Depression    Headache    Substance abuse [facility])    Past Surgical History:  Procedure Laterality Date   ARTHROSCOPIC REPAIR PCL     DENTAL SURGERY     KNEE ARTHROSCOPY WITH LATERAL MENISECTOMY Left 04/20/2022   Procedure: KNEE ARTHROSCOPY WITH PARTIAL  lateral MENISECTOMY;  Surgeon: Yolonda Kida, MD;  Location: [facility];  Service: Orthopedics;  Laterality: Left;   NASAL SEPTUM SURGERY  07/2017   SPINAL CORD STIMULATOR INSERTION N/A 05/18/2016   Procedure: LUMBAR SPINAL CORD STIMULATOR INSERTION;  Surgeon: Venita Lick, MD;  Location: [facility] OR;  Service: Orthopedics;  Laterality: N/A;   Social History   Socioeconomic History   Marital status: Single    Spouse name: Not on file   Number of children: Not on file   Years of education: Not on file   Highest education level: Not on file  Occupational History   Occupation: Educational psychologist  Tobacco Use   Smoking status: Former    Current packs/day: 0.50    Average packs/day: 0.5 packs/day for 20.0 years (10.0 ttl  pk-yrs)    Types: Cigarettes    Passive exposure: Past   Smokeless tobacco: Never   Tobacco comments:    1/2 pack per day  Vaping Use   Vaping status: Every Day   Substances: Nicotine  Substance and Sexual Activity   Alcohol use: No    Alcohol/week: 5.0 standard drinks of alcohol    Types: 5 Cans of beer per week    Comment: sober for 5 years   Drug use: Yes    Types: Marijuana    Comment: occ   Sexual activity: Not Currently    Birth control/protection: Injection, Abstinence  Other Topics Concern   Not on file  Social History Narrative   Not on file   Social Determinants of Health   Financial Resource Strain: Medium Risk (05/01/2022)   Received from [facility], [facility]   Overall Financial Resource Strain (CARDIA)    Difficulty of Paying Living Expenses: Somewhat hard  Food Insecurity: Unknown (05/01/2022)   Received from [facility], [facility]   Hunger Vital Sign    Worried About Running Out of Food in the Last Year: Not on file    Ran Out of Food in the Last Year: Never true  Transportation Needs: No Transportation Needs (05/01/2022)   Received from [facility], [facility]   [facility] - Transportation    Lack of Transportation (Medical): No    Lack of Transportation (Non-Medical): No  Physical Activity: Sufficiently Active (05/01/2022)   Received from [facility], [facility]   Exercise Vital Sign    Days of Exercise per Week: 7 days    Minutes of Exercise per Session: 30 min  Stress: Patient Declined (05/01/2022)   Received from [facility], [facility] - Occupational Stress Questionnaire    Feeling of Stress : Patient declined  Social Connections: Socially Integrated (05/01/2022)   Received from [facility], [facility]   Social Network    How would you rate your social network (family, work, friends)?: Good participation with social networks   Family History  Problem Relation Age of Onset    Depression Mother    Hypertension Father    Heart failure Father    Stroke Father    Colon cancer Maternal Grandmother    Esophageal cancer Maternal Grandfather    Allergies  Allergen Reactions   Singulair [Montelukast] Other (See Comments)    Syncope when used with her psych meds   Neomycin Rash   Prograf [Tacrolimus] Rash   Sulfa Antibiotics Nausea And Vomiting and Rash   Current Outpatient Medications  Medication Sig Dispense Refill   acetaminophen (TYLENOL) 500 MG tablet Take 1,000 mg by mouth every 6 (six) hours as needed for mild pain or moderate pain.     buPROPion (WELLBUTRIN XL) 150 MG 24 hr tablet Take 150 mg by mouth every morning.     diphenoxylate-atropine (LOMOTIL) 2.5-0.025 MG tablet TAKE 1 TO 2 TABLETS BY MOUTH FOUR TIMES A DAY AS NEEDED DECREASE AS DIRECTED 120 tablet 5   EPINEPHrine 0.3 mg/0.3 mL IJ SOAJ injection Inject 0.3 mg into the muscle as needed for anaphylaxis.     gabapentin (NEURONTIN) 300 MG capsule Take 1 capsule (300 mg total) by mouth 3 (three) times daily. 90 capsule 0   lurasidone (LATUDA) 40 MG TABS tablet Take 40 mg by mouth at bedtime.     medroxyPROGESTERone (DEPO-PROVERA) 150 MG/ML injection Inject 1 mL (150 mg total) into the muscle every 3 (three) months. 1 mL 3   oxybutynin (DITROPAN-XL) 10 MG 24 hr tablet Take 1 tablet (10 mg total) by mouth daily. 90 tablet 3   QUEtiapine (SEROQUEL) 25 MG tablet Take 25 mg by mouth at bedtime.     Current Facility-Administered Medications  Medication Dose Route Frequency Provider Last Rate Last Admin   medroxyPROGESTERone (DEPO-PROVERA) injection 150 mg  150 mg Intramuscular Q90 days Corlis Hove, NP   150 mg at 02/13/23 0845   No results found.  Review of Systems:   A ROS was performed including pertinent positives and negatives as documented in the HPI.  Physical Exam :   Constitutional: NAD and appears stated age Neurological: Alert and oriented Psych: Appropriate affect and cooperative There  were no vitals taken for this visit.   Comprehensive Musculoskeletal Exam:    Left knee with quadriceps tone which is decreased the contralateral side.  There is evidence of hip instability with decreased tissue charger on passive external rotation and recoil of the left hip compared to the right.  Left knee is painful predominantly about the patella deeply with a positive patellar grind.  No joint line tenderness, negative Lachman, negative posterior drawer, negative McMurray  Imaging:   Xray (  4 view left knee, limb length the): Neutral alignment of the left leg.  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   Assessment:   52 y.o. 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  I did describe that overall at this time I do not believe that she needs to strictly refrain from running.  Specifically I believe that she can begin a guided patellofemoral program to work on her hip and core strength.  I do believe that this would dramatically impact her knee.  Did also give her recommendation here in town for a local [employer] treadmill so that she can work on running and the lower impact setting.  I will plan to see her back in 3 months for reassessment  Plan :    -Return to clinic 3 months for reassessment     I personally saw and evaluated the patient, and participated in the management and treatment plan.  Huel Cote, MD Attending Physician, Orthopedic Surgery  This document was dictated using Dragon voice recognition software. A reasonable attempt at proof reading has been made to minimize errors.

## 2023-02-26 ENCOUNTER — Telehealth: Payer: Self-pay

## 2023-02-26 NOTE — Telephone Encounter (Signed)
Patient LM on triage line stating that she was told she would have a referral to pelvic floor PT but she has not has not received a call.

## 2023-03-01 NOTE — Telephone Encounter (Signed)
Pt has appt

## 2023-03-03 ENCOUNTER — Encounter (HOSPITAL_BASED_OUTPATIENT_CLINIC_OR_DEPARTMENT_OTHER): Payer: Self-pay | Admitting: Physical Therapy

## 2023-03-03 ENCOUNTER — Ambulatory Visit (HOSPITAL_BASED_OUTPATIENT_CLINIC_OR_DEPARTMENT_OTHER): Payer: Medicare PPO | Attending: Orthopaedic Surgery | Admitting: Physical Therapy

## 2023-03-03 DIAGNOSIS — G8929 Other chronic pain: Secondary | ICD-10-CM | POA: Diagnosis present

## 2023-03-03 DIAGNOSIS — M222X2 Patellofemoral disorders, left knee: Secondary | ICD-10-CM | POA: Insufficient documentation

## 2023-03-03 DIAGNOSIS — R2689 Other abnormalities of gait and mobility: Secondary | ICD-10-CM | POA: Diagnosis present

## 2023-03-03 DIAGNOSIS — M25562 Pain in left knee: Secondary | ICD-10-CM | POA: Diagnosis not present

## 2023-03-03 NOTE — Therapy (Signed)
OUTPATIENT PHYSICAL THERAPY LOWER EXTREMITY EVALUATION   Patient Name: Alicia Logan MRN: [id number] DOB:08/03/1970, 52 y.o., female Today's Date: 03/03/2023  END OF SESSION:  PT End of Session - 03/03/23 1346     Visit Number 1    Number of Visits 8    Date for PT Re-Evaluation 04/28/23    PT Start Time 1000    PT Stop Time 1047    PT Time Calculation (min) 47 min    Activity Tolerance Patient tolerated treatment well    Behavior During Therapy [facility] for tasks assessed/performed             Past Medical History:  Diagnosis Date   Anxiety    Arthritis    Bipolar 1 disorder (HCC)    Chronic back pain    Chronic diarrhea 08/2020   Depression    Headache    Substance abuse [facility])    Past Surgical History:  Procedure Laterality Date   ARTHROSCOPIC REPAIR PCL     DENTAL SURGERY     KNEE ARTHROSCOPY WITH LATERAL MENISECTOMY Left 04/20/2022   Procedure: KNEE ARTHROSCOPY WITH PARTIAL  lateral MENISECTOMY;  Surgeon: Yolonda Kida, MD;  Location: [facility];  Service: Orthopedics;  Laterality: Left;   NASAL SEPTUM SURGERY  07/2017   SPINAL CORD STIMULATOR INSERTION N/A 05/18/2016   Procedure: LUMBAR SPINAL CORD STIMULATOR INSERTION;  Surgeon: Venita Lick, MD;  Location: [facility] OR;  Service: Orthopedics;  Laterality: N/A;   Patient Active Problem List   Diagnosis Date Noted   Bipolar disorder, curr episode depressed, severe, w/psychotic features (HCC) 06/30/2016   Moderate benzodiazepine use disorder (HCC) 06/30/2016   Opioid use disorder, moderate, dependence (HCC) 06/30/2016   Stimulant use disorder 06/30/2016   Chronic pain syndrome 06/30/2016   History of back surgery 06/30/2016   S/P insertion of spinal cord stimulator 06/30/2016   Polysubstance dependence (HCC) 06/29/2016   Major depressive disorder, recurrent, moderate (HCC) 06/29/2016   Chronic pain 05/18/2016   Viral illness 11/26/2011   Depression 11/26/2011   Chronic lower back pain 11/26/2011    PCP:  No PCP   REFERRING PROVIDER: Dr Huel Cote  REFERRING DIAG:  Diagnosis  M25.562,G89.29 (ICD-10-CM) - Chronic pain of left knee  M22.2X2 (ICD-10-CM) - Patellofemoral pain syndrome of left knee    THERAPY DIAG:  Chronic pain of left knee  Other abnormalities of gait and mobility  Rationale for Evaluation and Treatment: Rehabilitation  ONSET DATE:   SUBJECTIVE:   SUBJECTIVE STATEMENT: Patient had a knee arthroscopy and meniscal debridement on 04/21/2023.  She was told that she can go back to full activity after 2 weeks.  She gradually increase her activity but over that timeframe her pain began to increase.  She went to physical therapy for 6 weeks with no significant improvement in pain.  She returned to the doctor told her just not to run or jump anymore.  She is an avid runner and would like to continue running.  She went for a second opinion.  She was sent over here for physical therapy.  PERTINENT HISTORY: Anxiety, depression, bipolar 1 disorder, substance abuse, headaches spinal cord stimulator insertion 2017, arthroscopic repair of PCL unspecified date and knee. PAIN:  Are you having pain? Yes: NPRS scale: 5/10 Pain location: Medial knee Pain description: Aching Aggravating factors: Crossing legs and deep bending Relieving factors: Not getting in these positions  PRECAUTIONS: Spinal stimulator   RED FLAGS: None    WEIGHT BEARING RESTRICTIONS: No  FALLS:  Has patient fallen in last 6 months? Yes. Number of falls Legs buckles but has not fallen the floor  LIVING ENVIRONMENT: Does not have steps at her house. Has to go up to visit her neighbor  OCCUPATION:   Dentist  Hobbies:  Running, exercises      PLOF: Independent  PATIENT GOALS:  To have less pain   NEXT MD VISIT:    OBJECTIVE:   DIAGNOSTIC FINDINGS:  Nothing post op   PATIENT SURVEYS:  FOTO    COGNITION: Overall cognitive status: Within functional limits for tasks  assessed     SENSATION: WFL  EDEMA:  No noticeable edema MUSCLE LENGTH:  POSTURE: No Significant postural limitations  PALPATION: No significant tenderness to palpation   LOWER EXTREMITY ROM:  Passive ROM Right eval Left eval  Hip flexion    Hip extension    Hip abduction    Hip adduction    Hip internal rotation    Hip external rotation    Knee flexion  Pain with loaded full flexion   Knee extension    Ankle dorsiflexion    Ankle plantarflexion    Ankle inversion    Ankle eversion     (Blank rows = not tested)  LOWER EXTREMITY MMT:  MMT Right eval Left eval  Hip flexion 31.4 24.6  Hip extension    Hip abduction 26.5 26.6  Hip adduction    Hip internal rotation    Hip external rotation    Knee flexion    Knee extension 42.2 34.7  Ankle dorsiflexion    Ankle plantarflexion    Ankle inversion    Ankle eversion     (Blank rows = not tested)    FUNCTIONAL TESTS:  Squat: considerable knee valgus on the left   Single leg stance minor instability with knee unlcoked on the left   GAIT: No significant gait deviations noted.  With patient's stability starts to improve we will do a running analysis  TODAY'S TREATMENT:                                                                                                                              DATE: Assessment of previous HEP.  Patient's previous HEP had 11 different exercises that she was to do.  7-8 of these exercises involved bending the knee including lunges, squats and knee extension machine at the gym.  She was advised we will work with more straight line straight leg stability and hip stability.  We will disregard previous HEP   Exercises - Supine Bridge  - 1 x daily - 7 x weekly - 3 sets - 10 reps - Supine Active Straight Leg Raise  - 1 x daily - 7 x weekly - 3 sets - 8 reps - Single Leg Stance with Diagonal Forward Reach  - 1 x daily - 7 x weekly - 3 sets - 10 reps - Side Stepping with Resistance at  Ankles  -  1 x daily - 7 x weekly - 3 sets - 10 reps   PATIENT EDUCATION:  Education details: HEP, symptom management  Person educated: Patient Education method: Explanation, Demonstration, Tactile cues, Verbal cues, and Handouts Education comprehension: verbalized understanding, returned demonstration, verbal cues required, tactile cues required, and needs further education  HOME EXERCISE PROGRAM: Access Code: [email or web address] URL: [email or web address] Date: 03/03/2023 Prepared by: Lorayne Bender  Exercises - Supine Bridge  - 1 x daily - 7 x weekly - 3 sets - 10 reps - Supine Active Straight Leg Raise  - 1 x daily - 7 x weekly - 3 sets - 8 reps - Single Leg Stance with Diagonal Forward Reach  - 1 x daily - 7 x weekly - 3 sets - 10 reps - Side Stepping with Resistance at Ankles  - 1 x daily - 7 x weekly - 3 sets - 10 reps  ASSESSMENT:  CLINICAL IMPRESSION: Patient is a 52 year old female with left knee pain.  She has increased pain when she crosses her leg.  She is an avid runner and would like to get back to running.  Running causes her increased pain.  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  She has a valgus movement when squatting on the left.  No valgus on the left with squatting.  She would benefit from skilled therapy to improve left strength and stability in order to return to running and regular fitness routine. OBJECTIVE IMPAIRMENTS: decreased mobility, decreased strength, and pain.   ACTIVITY LIMITATIONS: squatting, stairs, and running   PARTICIPATION LIMITATIONS: occupation and yard work  PERSONAL FACTORS: 1-2 comorbidities: anxiety, prior menisectomy   are also affecting patient's functional outcome.   REHAB POTENTIAL: Good  CLINICAL DECISION MAKING: Stable/uncomplicated  EVALUATION COMPLEXITY: Low   GOALS: Goals reviewed with  patient? Yes  SHORT TERM GOALS: Target date: 03/31/2023   Patient will increase gross left lower extremity strength by 5 pounds Baseline: Goal status: INITIAL  2.  Patient will demonstrate equal single-leg stance time left versus right Baseline:  Goal status: INITIAL  3.  Patient will have a be independent with basic HEP Baseline:  Goal status: INITIAL  LONG TERM GOALS: Target date: 04/28/2023    Patient will get down in a deep kneeling position without increased pain in order to perform yoga and similar type of activities Baseline:  Goal status: INITIAL  2.  Patient will return to running without pain Baseline:  Goal status: INITIAL  3.  Patient will return to Pilates exercises without increased pain Baseline:  Goal status: INITIAL   PLAN:  PT FREQUENCY: 1-2x/week  PT DURATION: 8 weeks  PLANNED INTERVENTIONS: Therapeutic exercises, Therapeutic activity, Neuromuscular re-education, Balance training, Gait training, Patient/Family education, Self Care, Joint mobilization, Stair training, DME instructions, Aquatic Therapy, Dry Needling, Electrical stimulation, Cryotherapy, Moist heat, Taping, Manual therapy, and Re-evaluation.   PLAN FOR NEXT SESSION:  Assess squat next visit. See if it has improved; progress current exercises as tolerated. Consider side lying series of exercises of the hips. Progress cone drill if able. Consider cable walk; consider 2 inch step down. She had this on her previous HEP but likley was too high. She also had leg press. Consider reviewing how to [facility] that effective.   Referring diagnosis?  M25.562,G89.29 (ICD-10-CM) - Chronic pain of left knee  M22.2X2 (ICD-10-CM) - Patellofemoral pain syndrome of left knee  Treatment diagnosis? (if different than referring diagnosis)  What was this (referring dx) caused by? [x]  Surgery []  Fall []  Ongoing issue []  Arthritis []  Other: ____________  Laterality: []  Rt []  Lt []  Both  Check all possible  CPT codes:  *CHOOSE 10 OR LESS*    []  97110 (Therapeutic Exercise)  []  92507 (SLP Treatment)  []  97112 (Neuro Re-ed)   []  92526 (Swallowing Treatment)   []  97116 (Gait Training)   []  [id number] (Cognitive Training, 1st 15 minutes) []  97140 (Manual Therapy)   []  97130 (Cognitive Training, each add'l 15 minutes)  []  97164 (Re-evaluation)                              []  Other, List CPT Code ____________  []  97530 (Therapeutic Activities)     []  97535 (Self Care)   [x]  All codes above (97110 - 97535)  []  97012 (Mechanical Traction)  []  97014 (E-stim Unattended)  []  97032 (E-stim manual)  []  97033 (Ionto)  []  97035 (Ultrasound) []  97750 (Physical Performance Training) []  [id number] (Aquatic Therapy) []  97016 (Vasopneumatic Device) []  [id number] (Paraffin) []  97034 (Contrast Bath) []  97597 (Wound Care 1st 20 sq cm) []  97598 (Wound Care each add'l 20 sq cm) []  97760 (Orthotic Fabrication, Fitting, Training Initial) []  [id number] (Prosthetic Management and Training Initial) []  [id number] (Orthotic or Prosthetic Training/ Modification Subsequent)   Dessie Coma, PT 03/03/2023, 1:57 PM

## 2023-03-21 ENCOUNTER — Ambulatory Visit (HOSPITAL_BASED_OUTPATIENT_CLINIC_OR_DEPARTMENT_OTHER): Payer: Medicare PPO | Admitting: Physical Therapy

## 2023-03-21 ENCOUNTER — Encounter (HOSPITAL_BASED_OUTPATIENT_CLINIC_OR_DEPARTMENT_OTHER): Payer: Self-pay | Admitting: Physical Therapy

## 2023-03-21 DIAGNOSIS — G8929 Other chronic pain: Secondary | ICD-10-CM

## 2023-03-21 DIAGNOSIS — M25562 Pain in left knee: Secondary | ICD-10-CM | POA: Diagnosis not present

## 2023-03-21 DIAGNOSIS — R2689 Other abnormalities of gait and mobility: Secondary | ICD-10-CM

## 2023-03-21 NOTE — Therapy (Signed)
OUTPATIENT PHYSICAL THERAPY LOWER EXTREMITY EVALUATION   Patient Name: Alicia Logan MRN: [id number] DOB:1971-05-29, 52 y.o., female Today's Date: 03/21/2023  END OF SESSION:  PT End of Session - 03/21/23 1152     Visit Number 2    Number of Visits 8    PT Start Time 1145    PT Stop Time 1228    PT Time Calculation (min) 43 min    Activity Tolerance Patient tolerated treatment well             Past Medical History:  Diagnosis Date   Anxiety    Arthritis    Bipolar 1 disorder (HCC)    Chronic back pain    Chronic diarrhea 08/2020   Depression    Headache    Substance abuse [facility])    Past Surgical History:  Procedure Laterality Date   ARTHROSCOPIC REPAIR PCL     DENTAL SURGERY     KNEE ARTHROSCOPY WITH LATERAL MENISECTOMY Left 04/20/2022   Procedure: KNEE ARTHROSCOPY WITH PARTIAL  lateral MENISECTOMY;  Surgeon: Yolonda Kida, MD;  Location: [facility];  Service: Orthopedics;  Laterality: Left;   NASAL SEPTUM SURGERY  07/2017   SPINAL CORD STIMULATOR INSERTION N/A 05/18/2016   Procedure: LUMBAR SPINAL CORD STIMULATOR INSERTION;  Surgeon: Venita Lick, MD;  Location: [facility];  Service: Orthopedics;  Laterality: N/A;   Patient Active Problem List   Diagnosis Date Noted   Bipolar disorder, curr episode depressed, severe, w/psychotic features (HCC) 06/30/2016   Moderate benzodiazepine use disorder (HCC) 06/30/2016   Opioid use disorder, moderate, dependence (HCC) 06/30/2016   Stimulant use disorder 06/30/2016   Chronic pain syndrome 06/30/2016   History of back surgery 06/30/2016   S/P insertion of spinal cord stimulator 06/30/2016   Polysubstance dependence (HCC) 06/29/2016   Major depressive disorder, recurrent, moderate (HCC) 06/29/2016   Chronic pain 05/18/2016   Viral illness 11/26/2011   Depression 11/26/2011   Chronic lower back pain 11/26/2011    PCP: No PCP   REFERRING PROVIDER: Dr Huel Cote  REFERRING DIAG:  Diagnosis  M25.562,G89.29  (ICD-10-CM) - Chronic pain of left knee  M22.2X2 (ICD-10-CM) - Patellofemoral pain syndrome of left knee    THERAPY DIAG:  Chronic pain of left knee  Other abnormalities of gait and mobility  Rationale for Evaluation and Treatment: Rehabilitation  ONSET DATE:   SUBJECTIVE:   SUBJECTIVE STATEMENT: The patient admits she hasn't done any of the PT. She has been doing piliates. Her knee is the same. She has been having pain with standing and walking.    Eval:  9Patient had a knee arthroscopy and meniscal debridement on 04/21/2023.  She was told that she can go back to full activity after 2 weeks.  She gradually increase her activity but over that timeframe her pain began to increase.  She went to physical therapy for 6 weeks with no significant improvement in pain.  She returned to the doctor told her just not to run or jump anymore.  She is an avid runner and would like to continue running.  She went for a second opinion.  She was sent over here for physical therapy.  PERTINENT HISTORY: Anxiety, depression, bipolar 1 disorder, substance abuse, headaches spinal cord stimulator insertion 2017, arthroscopic repair of PCL unspecified date and knee. PAIN:  Are you having pain? Yes: NPRS scale: 5/10 Pain location: Medial knee Pain description: Aching Aggravating factors: Crossing legs and deep bending Relieving factors: Not getting in these positions  PRECAUTIONS: Spinal  stimulator   RED FLAGS: None    WEIGHT BEARING RESTRICTIONS: No  FALLS:  Has patient fallen in last 6 months? Yes. Number of falls Legs buckles but has not fallen the floor  LIVING ENVIRONMENT: Does not have steps at her house. Has to go up to visit her neighbor  OCCUPATION:   Dentist  Hobbies:  Running, exercises      PLOF: Independent  PATIENT GOALS:  To have less pain   NEXT MD VISIT:    OBJECTIVE:   DIAGNOSTIC FINDINGS:  Nothing post op   PATIENT SURVEYS:  FOTO     COGNITION: Overall cognitive status: Within functional limits for tasks assessed     SENSATION: WFL  EDEMA:  No noticeable edema MUSCLE LENGTH:  POSTURE: No Significant postural limitations  PALPATION: No significant tenderness to palpation   LOWER EXTREMITY ROM:  Passive ROM Right eval Left eval  Hip flexion    Hip extension    Hip abduction    Hip adduction    Hip internal rotation    Hip external rotation    Knee flexion  Pain with loaded full flexion   Knee extension    Ankle dorsiflexion    Ankle plantarflexion    Ankle inversion    Ankle eversion     (Blank rows = not tested)  LOWER EXTREMITY MMT:  MMT Right eval Left eval  Hip flexion 31.4 24.6  Hip extension    Hip abduction 26.5 26.6  Hip adduction    Hip internal rotation    Hip external rotation    Knee flexion    Knee extension 42.2 34.7  Ankle dorsiflexion    Ankle plantarflexion    Ankle inversion    Ankle eversion     (Blank rows = not tested)    FUNCTIONAL TESTS:  Squat: considerable knee valgus on the left   Single leg stance minor instability with knee unlcoked on the left   GAIT: No significant gait deviations noted.  With patient's stability starts to improve we will do a running analysis  TODAY'S TREATMENT:                                                                                                                              DATE:  8/28 Ex bike 5 min   SLR 10,7, 8 RPE of 5-7   Bridge 14, 14, 14 red band RPE 6   Cone drill 2x7 with knee locked RPE up towards 8 so exercise stopped   Step onto air-ex x10   Manual: Mcconell patella stabilization     Eval: Assessment of previous HEP.  Patient's previous HEP had 11 different exercises that she was to do.  7-8 of these exercises involved bending the knee including lunges, squats and knee extension machine at the gym.  She was advised we will work with more straight line straight leg stability and hip stability.   We will disregard previous HEP  Exercises - Supine Bridge  - 1 x daily - 7 x weekly - 3 sets - 10 reps - Supine Active Straight Leg Raise  - 1 x daily - 7 x weekly - 3 sets - 8 reps - Single Leg Stance with Diagonal Forward Reach  - 1 x daily - 7 x weekly - 3 sets - 10 reps - Side Stepping with Resistance at Ankles  - 1 x daily - 7 x weekly - 3 sets - 10 reps   PATIENT EDUCATION:  Education details: HEP, symptom management  Person educated: Patient Education method: Explanation, Demonstration, Tactile cues, Verbal cues, and Handouts Education comprehension: verbalized understanding, returned demonstration, verbal cues required, tactile cues required, and needs further education  HOME EXERCISE PROGRAM:   Eval: Access Code: [email or web address] URL: [email or web address] Date: 03/03/2023 Prepared by: Lorayne Bender  Exercises - Supine Bridge  - 1 x daily - 7 x weekly - 3 sets - 10 reps - Supine Active Straight Leg Raise  - 1 x daily - 7 x weekly - 3 sets - 8 reps - Single Leg Stance with Diagonal Forward Reach  - 1 x daily - 7 x weekly - 3 sets - 10 reps - Side Stepping with Resistance at Ankles  - 1 x daily - 7 x weekly - 3 sets - 10 reps  ASSESSMENT:  CLINICAL IMPRESSION: The patient had some pain towards the end of the treatment. We adjusted her sets and reps using RPE. She was advised to try her exercises for 2 weeks and lets see if we can get some improvement. She had some instability with cone drill. We will adjust her exercises depending on how she does when she does them.    Eval:Patient is a 52 year old female with left knee pain.  She has increased pain when she crosses her leg.  She is an avid runner and would like to get back to running.  Running causes her increased pain.  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  She has a valgus movement  when squatting on the left.  No valgus on the left with squatting.  She would benefit from skilled therapy to improve left strength and stability in order to return to running and regular fitness routine. OBJECTIVE IMPAIRMENTS: decreased mobility, decreased strength, and pain.   ACTIVITY LIMITATIONS: squatting, stairs, and running   PARTICIPATION LIMITATIONS: occupation and yard work  PERSONAL FACTORS: 1-2 comorbidities: anxiety, prior menisectomy   are also affecting patient's functional outcome.   REHAB POTENTIAL: Good  CLINICAL DECISION MAKING: Stable/uncomplicated  EVALUATION COMPLEXITY: Low   GOALS: Goals reviewed with patient? Yes  SHORT TERM GOALS: Target date: 03/31/2023   Patient will increase gross left lower extremity strength by 5 pounds Baseline: Goal status: INITIAL  2.  Patient will demonstrate equal single-leg stance time left versus right Baseline:  Goal status: INITIAL  3.  Patient will have a be independent with basic HEP Baseline:  Goal status: INITIAL  LONG TERM GOALS: Target date: 04/28/2023    Patient will get down in a deep kneeling position without increased pain in order to perform yoga and similar type of activities Baseline:  Goal status: INITIAL  2.  Patient will return to running without pain Baseline:  Goal status: INITIAL  3.  Patient will return to Pilates exercises without increased pain Baseline:  Goal status: INITIAL   PLAN:  PT FREQUENCY: 1-2x/week  PT DURATION: 8 weeks  PLANNED INTERVENTIONS: Therapeutic exercises, Therapeutic activity, Neuromuscular re-education, Balance training, Gait training, Patient/Family education, Self Care, Joint mobilization, Stair training, DME instructions, Aquatic Therapy, Dry Needling, Electrical stimulation, Cryotherapy, Moist heat, Taping, Manual therapy, and Re-evaluation.   PLAN FOR NEXT SESSION:  Assess squat next visit. See if it has improved; progress current exercises as tolerated.  Consider side lying series of exercises of the hips. Progress cone drill if able. Consider cable walk; consider 2 inch step down. She had this on her previous HEP but likley was too high. She also had leg press. Consider reviewing how to [facility] that effective.   Referring diagnosis?  M25.562,G89.29 (ICD-10-CM) - Chronic pain of left knee  M22.2X2 (ICD-10-CM) - Patellofemoral pain syndrome of left knee   Treatment diagnosis? (if different than referring diagnosis)  What was this (referring dx) caused by? [x]  Surgery []  Fall []  Ongoing issue []  Arthritis []  Other: ____________  Laterality: []  Rt []  Lt []  Both  Check all possible CPT codes:  *CHOOSE 10 OR LESS*    []  97110 (Therapeutic Exercise)  []  92507 (SLP Treatment)  []  97112 (Neuro Re-ed)   []  92526 (Swallowing Treatment)   []  97116 (Gait Training)   []  [id number] (Cognitive Training, 1st 15 minutes) []  97140 (Manual Therapy)   []  97130 (Cognitive Training, each add'l 15 minutes)  []  97164 (Re-evaluation)                              []  Other, List CPT Code ____________  []  97530 (Therapeutic Activities)     []  97535 (Self Care)   [x]  All codes above (97110 - 97535)  []  97012 (Mechanical Traction)  []  97014 (E-stim Unattended)  []  97032 (E-stim manual)  []  97033 (Ionto)  []  97035 (Ultrasound) []  82956 (Physical Performance Training) []  [id number] (Aquatic Therapy) []  97016 (Vasopneumatic Device) []  [id number] (Paraffin) []  97034 (Contrast Bath) []  97597 (Wound Care 1st 20 sq cm) []  97598 (Wound Care each add'l 20 sq cm) []  97760 (Orthotic Fabrication, Fitting, Training Initial) []  [id number] (Prosthetic Management and Training Initial) []  [id number] (Orthotic or Prosthetic Training/ Modification Subsequent)   Dessie Coma, PT 03/21/2023, 11:53 AM

## 2023-03-28 ENCOUNTER — Ambulatory Visit (HOSPITAL_BASED_OUTPATIENT_CLINIC_OR_DEPARTMENT_OTHER): Payer: Medicare PPO | Admitting: Physical Therapy

## 2023-03-29 ENCOUNTER — Encounter (HOSPITAL_BASED_OUTPATIENT_CLINIC_OR_DEPARTMENT_OTHER): Payer: Self-pay | Admitting: Physical Therapy

## 2023-03-29 ENCOUNTER — Ambulatory Visit (HOSPITAL_BASED_OUTPATIENT_CLINIC_OR_DEPARTMENT_OTHER): Payer: Medicare PPO | Attending: Orthopaedic Surgery | Admitting: Physical Therapy

## 2023-03-29 DIAGNOSIS — G8929 Other chronic pain: Secondary | ICD-10-CM | POA: Insufficient documentation

## 2023-03-29 DIAGNOSIS — Z7409 Other reduced mobility: Secondary | ICD-10-CM | POA: Insufficient documentation

## 2023-03-29 DIAGNOSIS — R2689 Other abnormalities of gait and mobility: Secondary | ICD-10-CM

## 2023-03-29 DIAGNOSIS — M25562 Pain in left knee: Secondary | ICD-10-CM | POA: Insufficient documentation

## 2023-03-29 DIAGNOSIS — M222X2 Patellofemoral disorders, left knee: Secondary | ICD-10-CM | POA: Insufficient documentation

## 2023-03-29 DIAGNOSIS — R269 Unspecified abnormalities of gait and mobility: Secondary | ICD-10-CM | POA: Diagnosis not present

## 2023-03-29 NOTE — Therapy (Signed)
OUTPATIENT PHYSICAL THERAPY LOWER EXTREMITY EVALUATION   Patient Name: Alicia Logan MRN: [id number] DOB:March 09, 1971, 52 y.o., female Today's Date: 03/29/2023  END OF SESSION:  PT End of Session - 03/29/23 0919     Visit Number 3    Number of Visits 8    Date for PT Re-Evaluation 04/28/23    PT Start Time 0804    PT Stop Time 0845    PT Time Calculation (min) 41 min    Activity Tolerance Patient tolerated treatment well    Behavior During Therapy [facility] for tasks assessed/performed              Past Medical History:  Diagnosis Date   Anxiety    Arthritis    Bipolar 1 disorder (HCC)    Chronic back pain    Chronic diarrhea 08/2020   Depression    Headache    Substance abuse [facility])    Past Surgical History:  Procedure Laterality Date   ARTHROSCOPIC REPAIR PCL     DENTAL SURGERY     KNEE ARTHROSCOPY WITH LATERAL MENISECTOMY Left 04/20/2022   Procedure: KNEE ARTHROSCOPY WITH PARTIAL  lateral MENISECTOMY;  Surgeon: Yolonda Kida, MD;  Location: [facility];  Service: Orthopedics;  Laterality: Left;   NASAL SEPTUM SURGERY  07/2017   SPINAL CORD STIMULATOR INSERTION N/A 05/18/2016   Procedure: LUMBAR SPINAL CORD STIMULATOR INSERTION;  Surgeon: Venita Lick, MD;  Location: [facility] OR;  Service: Orthopedics;  Laterality: N/A;   Patient Active Problem List   Diagnosis Date Noted   Bipolar disorder, curr episode depressed, severe, w/psychotic features (HCC) 06/30/2016   Moderate benzodiazepine use disorder (HCC) 06/30/2016   Opioid use disorder, moderate, dependence (HCC) 06/30/2016   Stimulant use disorder 06/30/2016   Chronic pain syndrome 06/30/2016   History of back surgery 06/30/2016   S/P insertion of spinal cord stimulator 06/30/2016   Polysubstance dependence (HCC) 06/29/2016   Major depressive disorder, recurrent, moderate (HCC) 06/29/2016   Chronic pain 05/18/2016   Viral illness 11/26/2011   Depression 11/26/2011   Chronic lower back pain 11/26/2011    PCP:  No PCP   REFERRING PROVIDER: Dr Huel Cote  REFERRING DIAG:  Diagnosis  M25.562,G89.29 (ICD-10-CM) - Chronic pain of left knee  M22.2X2 (ICD-10-CM) - Patellofemoral pain syndrome of left knee    THERAPY DIAG:  Chronic pain of left knee  Other abnormalities of gait and mobility  Rationale for Evaluation and Treatment: Rehabilitation  ONSET DATE:   SUBJECTIVE:   SUBJECTIVE STATEMENT: The patient has been working on her PT. She had to take a few days off over the weekend. She went to a concert and had to go down several flights of sirs. She reports the tape didn't help. She was very sore after PT the last time.   Eval:  9Patient had a knee arthroscopy and meniscal debridement on 04/21/2023.  She was told that she can go back to full activity after 2 weeks.  She gradually increase her activity but over that timeframe her pain began to increase.  She went to physical therapy for 6 weeks with no significant improvement in pain.  She returned to the doctor told her just not to run or jump anymore.  She is an avid runner and would like to continue running.  She went for a second opinion.  She was sent over here for physical therapy.  PERTINENT HISTORY: Anxiety, depression, bipolar 1 disorder, substance abuse, headaches spinal cord stimulator insertion 2017, arthroscopic repair of PCL unspecified  date and knee. PAIN:  Are you having pain? Yes: NPRS scale: 5/10 Pain location: Medial knee Pain description: Aching Aggravating factors: Crossing legs and deep bending Relieving factors: Not getting in these positions  PRECAUTIONS: Spinal stimulator   RED FLAGS: None    WEIGHT BEARING RESTRICTIONS: No  FALLS:  Has patient fallen in last 6 months? Yes. Number of falls Legs buckles but has not fallen the floor  LIVING ENVIRONMENT: Does not have steps at her house. Has to go up to visit her neighbor  OCCUPATION:   Dentist  Hobbies:  Running, exercises      PLOF:  Independent  PATIENT GOALS:  To have less pain   NEXT MD VISIT:    OBJECTIVE:   DIAGNOSTIC FINDINGS:  Nothing post op   PATIENT SURVEYS:  FOTO    COGNITION: Overall cognitive status: Within functional limits for tasks assessed     SENSATION: WFL  EDEMA:  No noticeable edema MUSCLE LENGTH:  POSTURE: No Significant postural limitations  PALPATION: No significant tenderness to palpation   LOWER EXTREMITY ROM:  Passive ROM Right eval Left eval  Hip flexion    Hip extension    Hip abduction    Hip adduction    Hip internal rotation    Hip external rotation    Knee flexion  Pain with loaded full flexion   Knee extension    Ankle dorsiflexion    Ankle plantarflexion    Ankle inversion    Ankle eversion     (Blank rows = not tested)  LOWER EXTREMITY MMT:  MMT Right eval Left eval  Hip flexion 31.4 24.6  Hip extension    Hip abduction 26.5 26.6  Hip adduction    Hip internal rotation    Hip external rotation    Knee flexion    Knee extension 42.2 34.7  Ankle dorsiflexion    Ankle plantarflexion    Ankle inversion    Ankle eversion     (Blank rows = not tested)    FUNCTIONAL TESTS:  Squat: considerable knee valgus on the left   Single leg stance minor instability with knee unlcoked on the left   GAIT: No significant gait deviations noted.  With patient's stability starts to improve we will do a running analysis  TODAY'S TREATMENT:                                                                                                                              DATE:  9/5 SLR 15, 10, 10   Bridge 14 14, 14 with red band   Single leg march 2x10 could feel mild pain   Cable walk posterior x10 10 lbs to start then moved to 15.   Reviewed HEP. Cone Drill taken out.       8/28 Ex bike 5 min   SLR 10,7, 8 RPE of 5-7   Bridge 14, 14, 14 red band RPE 6   Cone  drill 2x7 with knee locked RPE up towards 8 so exercise stopped   Step onto  air-ex x10   Manual: Mcconell patella stabilization     Eval: Assessment of previous HEP.  Patient's previous HEP had 11 different exercises that she was to do.  7-8 of these exercises involved bending the knee including lunges, squats and knee extension machine at the gym.  She was advised we will work with more straight line straight leg stability and hip stability.  We will disregard previous HEP   Exercises - Supine Bridge  - 1 x daily - 7 x weekly - 3 sets - 10 reps - Supine Active Straight Leg Raise  - 1 x daily - 7 x weekly - 3 sets - 8 reps - Single Leg Stance with Diagonal Forward Reach  - 1 x daily - 7 x weekly - 3 sets - 10 reps - Side Stepping with Resistance at Ankles  - 1 x daily - 7 x weekly - 3 sets - 10 reps   PATIENT EDUCATION:  Education details: HEP, symptom management  Person educated: Patient Education method: Explanation, Demonstration, Tactile cues, Verbal cues, and Handouts Education comprehension: verbalized understanding, returned demonstration, verbal cues required, tactile cues required, and needs further education  HOME EXERCISE PROGRAM:   Eval: Access Code: [email or web address] URL: [email or web address] Date: 03/03/2023 Prepared by: Lorayne Bender  Exercises - Supine Bridge  - 1 x daily - 7 x weekly - 3 sets - 10 reps - Supine Active Straight Leg Raise  - 1 x daily - 7 x weekly - 3 sets - 8 reps - Single Leg Stance with Diagonal Forward Reach  - 1 x daily - 7 x weekly - 3 sets - 10 reps - Side Stepping with Resistance at Ankles  - 1 x daily - 7 x weekly - 3 sets - 10 reps  ASSESSMENT:  CLINICAL IMPRESSION: The patient was able to progress her SLR and bridging numbers. We took out cone drill. We added a low march. She had mild pain with that. We updated and reviewed her HEP. She was advised to continue her piliates. We will monitor her symptoms.   Eval:Patient is a 52 year old female with left knee pain.  She has increased pain when she  crosses her leg.  She is an avid runner and would like to get back to running.  Running causes her increased pain.  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  She has a valgus movement when squatting on the left.  No valgus on the left with squatting.  She would benefit from skilled therapy to improve left strength and stability in order to return to running and regular fitness routine. OBJECTIVE IMPAIRMENTS: decreased mobility, decreased strength, and pain.   ACTIVITY LIMITATIONS: squatting, stairs, and running   PARTICIPATION LIMITATIONS: occupation and yard work  PERSONAL FACTORS: 1-2 comorbidities: anxiety, prior menisectomy   are also affecting patient's functional outcome.   REHAB POTENTIAL: Good  CLINICAL DECISION MAKING: Stable/uncomplicated  EVALUATION COMPLEXITY: Low   GOALS: Goals reviewed with patient? Yes  SHORT TERM GOALS: Target date: 03/31/2023   Patient will increase gross left lower extremity strength by 5 pounds Baseline: Goal status: INITIAL  2.  Patient will demonstrate equal single-leg stance time left versus right Baseline:  Goal status: INITIAL  3.  Patient will have a be independent with basic HEP Baseline:  Goal status: INITIAL  LONG TERM GOALS: Target date: 04/28/2023    Patient will get down in a deep kneeling position without increased pain in order to perform yoga and similar type of activities Baseline:  Goal status: INITIAL  2.  Patient will return to running without pain Baseline:  Goal status: INITIAL  3.  Patient will return to Pilates exercises without increased pain Baseline:  Goal status: INITIAL   PLAN:  PT FREQUENCY: 1-2x/week  PT DURATION: 8 weeks  PLANNED INTERVENTIONS: Therapeutic exercises, Therapeutic activity, Neuromuscular re-education, Balance training, Gait training, Patient/Family  education, Self Care, Joint mobilization, Stair training, DME instructions, Aquatic Therapy, Dry Needling, Electrical stimulation, Cryotherapy, Moist heat, Taping, Manual therapy, and Re-evaluation.   PLAN FOR NEXT SESSION:  Assess squat next visit. See if it has improved; progress current exercises as tolerated. Consider side lying series of exercises of the hips. Progress cone drill if able. Consider cable walk; consider 2 inch step down. She had this on her previous HEP but likley was too high. She also had leg press. Consider reviewing how to [facility] that effective.   Referring diagnosis?  M25.562,G89.29 (ICD-10-CM) - Chronic pain of left knee  M22.2X2 (ICD-10-CM) - Patellofemoral pain syndrome of left knee   Treatment diagnosis? (if different than referring diagnosis)  What was this (referring dx) caused by? [x]  Surgery []  Fall []  Ongoing issue []  Arthritis []  Other: ____________  Laterality: []  Rt []  Lt []  Both  Check all possible CPT codes:  *CHOOSE 10 OR LESS*    []  97110 (Therapeutic Exercise)  []  92507 (SLP Treatment)  []  97112 (Neuro Re-ed)   []  92526 (Swallowing Treatment)   []  97116 (Gait Training)   []  [id number] (Cognitive Training, 1st 15 minutes) []  97140 (Manual Therapy)   []  97130 (Cognitive Training, each add'l 15 minutes)  []  97164 (Re-evaluation)                              []  Other, List CPT Code ____________  []  97530 (Therapeutic Activities)     []  97535 (Self Care)   [x]  All codes above (97110 - 97535)  []  97012 (Mechanical Traction)  []  97014 (E-stim Unattended)  []  97032 (E-stim manual)  []  97033 (Ionto)  []  97035 (Ultrasound) []  97750 (Physical Performance Training) []  [id number] (Aquatic Therapy) []  97016 (Vasopneumatic Device) []  16109 (Paraffin) []  97034 (Contrast Bath) []  97597 (Wound Care 1st 20 sq cm) []  97598 (Wound Care each add'l 20 sq cm) []  97760 (Orthotic Fabrication, Fitting, Training Initial) []  [id number] (Prosthetic Management and Training  Initial) []  [id number] (Orthotic or Prosthetic Training/ Modification Subsequent)   Dessie Coma, PT 03/29/2023, 9:20 AM

## 2023-04-04 ENCOUNTER — Encounter (HOSPITAL_BASED_OUTPATIENT_CLINIC_OR_DEPARTMENT_OTHER): Payer: Medicare PPO | Admitting: Physical Therapy

## 2023-04-04 ENCOUNTER — Ambulatory Visit (HOSPITAL_BASED_OUTPATIENT_CLINIC_OR_DEPARTMENT_OTHER): Payer: Medicare PPO | Attending: Orthopaedic Surgery | Admitting: Physical Therapy

## 2023-04-04 DIAGNOSIS — M25562 Pain in left knee: Secondary | ICD-10-CM | POA: Diagnosis present

## 2023-04-04 DIAGNOSIS — G8929 Other chronic pain: Secondary | ICD-10-CM | POA: Diagnosis present

## 2023-04-04 DIAGNOSIS — R2689 Other abnormalities of gait and mobility: Secondary | ICD-10-CM | POA: Diagnosis present

## 2023-04-04 NOTE — Therapy (Signed)
OUTPATIENT PHYSICAL THERAPY LOWER EXTREMITY EVALUATION   Patient Name: Alicia Logan MRN: [id number] DOB:January 16, 1971, 52 y.o., female Today's Date: 04/05/2023  END OF SESSION:  PT End of Session - 04/05/23 0757     Visit Number 4    Number of Visits 8    Date for PT Re-Evaluation 04/28/23    PT Start Time 1520   Patient 5 min late   PT Stop Time 1600    PT Time Calculation (min) 40 min    Activity Tolerance Patient tolerated treatment well    Behavior During Therapy [facility] for tasks assessed/performed               Past Medical History:  Diagnosis Date   Anxiety    Arthritis    Bipolar 1 disorder (HCC)    Chronic back pain    Chronic diarrhea 08/2020   Depression    Headache    Substance abuse [facility])    Past Surgical History:  Procedure Laterality Date   ARTHROSCOPIC REPAIR PCL     DENTAL SURGERY     KNEE ARTHROSCOPY WITH LATERAL MENISECTOMY Left 04/20/2022   Procedure: KNEE ARTHROSCOPY WITH PARTIAL  lateral MENISECTOMY;  Surgeon: Yolonda Kida, MD;  Location: [facility];  Service: Orthopedics;  Laterality: Left;   NASAL SEPTUM SURGERY  07/2017   SPINAL CORD STIMULATOR INSERTION N/A 05/18/2016   Procedure: LUMBAR SPINAL CORD STIMULATOR INSERTION;  Surgeon: Venita Lick, MD;  Location: [facility] OR;  Service: Orthopedics;  Laterality: N/A;   Patient Active Problem List   Diagnosis Date Noted   Bipolar disorder, curr episode depressed, severe, w/psychotic features (HCC) 06/30/2016   Moderate benzodiazepine use disorder (HCC) 06/30/2016   Opioid use disorder, moderate, dependence (HCC) 06/30/2016   Stimulant use disorder 06/30/2016   Chronic pain syndrome 06/30/2016   History of back surgery 06/30/2016   S/P insertion of spinal cord stimulator 06/30/2016   Polysubstance dependence (HCC) 06/29/2016   Major depressive disorder, recurrent, moderate (HCC) 06/29/2016   Chronic pain 05/18/2016   Viral illness 11/26/2011   Depression 11/26/2011   Chronic lower back pain  11/26/2011    PCP: No PCP   REFERRING PROVIDER: Dr Huel Cote  REFERRING DIAG:  Diagnosis  M25.562,G89.29 (ICD-10-CM) - Chronic pain of left knee  M22.2X2 (ICD-10-CM) - Patellofemoral pain syndrome of left knee    THERAPY DIAG:  Chronic pain of left knee  Other abnormalities of gait and mobility  Rationale for Evaluation and Treatment: Rehabilitation  ONSET DATE:   SUBJECTIVE:   SUBJECTIVE STATEMENT: The patient had pain going down stairs the other day. She was advised this is not unexpected. She has intermittent pain at times. She has been able to do her exercises consitently along with going to class.  Eval:  9Patient had a knee arthroscopy and meniscal debridement on 04/21/2023.  She was told that she can go back to full activity after 2 weeks.  She gradually increase her activity but over that timeframe her pain began to increase.  She went to physical therapy for 6 weeks with no significant improvement in pain.  She returned to the doctor told her just not to run or jump anymore.  She is an avid runner and would like to continue running.  She went for a second opinion.  She was sent over here for physical therapy.  PERTINENT HISTORY: Anxiety, depression, bipolar 1 disorder, substance abuse, headaches spinal cord stimulator insertion 2017, arthroscopic repair of PCL unspecified date and knee. PAIN:  Are  you having pain? Yes: NPRS scale: 5/10 Pain location: Medial knee Pain description: Aching Aggravating factors: Crossing legs and deep bending Relieving factors: Not getting in these positions  PRECAUTIONS: Spinal stimulator   RED FLAGS: None    WEIGHT BEARING RESTRICTIONS: No  FALLS:  Has patient fallen in last 6 months? Yes. Number of falls Legs buckles but has not fallen the floor  LIVING ENVIRONMENT: Does not have steps at her house. Has to go up to visit her neighbor  OCCUPATION:   Dentist  Hobbies:  Running, exercises      PLOF:  Independent  PATIENT GOALS:  To have less pain   NEXT MD VISIT:    OBJECTIVE:   DIAGNOSTIC FINDINGS:  Nothing post op   PATIENT SURVEYS:  FOTO    COGNITION: Overall cognitive status: Within functional limits for tasks assessed     SENSATION: WFL  EDEMA:  No noticeable edema MUSCLE LENGTH:  POSTURE: No Significant postural limitations  PALPATION: No significant tenderness to palpation   LOWER EXTREMITY ROM:  Passive ROM Right eval Left eval  Hip flexion    Hip extension    Hip abduction    Hip adduction    Hip internal rotation    Hip external rotation    Knee flexion  Pain with loaded full flexion   Knee extension    Ankle dorsiflexion    Ankle plantarflexion    Ankle inversion    Ankle eversion     (Blank rows = not tested)  LOWER EXTREMITY MMT:  MMT Right eval Left eval  Hip flexion 31.4 24.6  Hip extension    Hip abduction 26.5 26.6  Hip adduction    Hip internal rotation    Hip external rotation    Knee flexion    Knee extension 42.2 34.7  Ankle dorsiflexion    Ankle plantarflexion    Ankle inversion    Ankle eversion     (Blank rows = not tested)    FUNCTIONAL TESTS:  Squat: considerable knee valgus on the left   Single leg stance minor instability with knee unlcoked on the left   GAIT: No significant gait deviations noted.  With patient's stability starts to improve we will do a running analysis  TODAY'S TREATMENT:                                                                                                                              DATE:  9/11 SLR 15, 12, 10  Bridge 14 14, 14 with  green band   Cable walk posterior x10 10 lbs to start then moved to 15.   Reviewed HEP  Step onto air-ex forward and lateral 2x10   Mild pain going lateral    9/5 SLR 15, 10, 10   Bridge 14 14, 14 with red band   Single leg march 2x10 could feel mild pain   Cable walk posterior x10 10 lbs to start then  moved to 15.    Reviewed HEP. Cone Drill taken out.       8/28 Ex bike 5 min   SLR 10,7, 8 RPE of 5-7   Bridge 14, 14, 14 red band RPE 6   Cone drill 2x7 with knee locked RPE up towards 8 so exercise stopped   Step onto air-ex x10   Manual: Mcconell patella stabilization     Eval: Assessment of previous HEP.  Patient's previous HEP had 11 different exercises that she was to do.  7-8 of these exercises involved bending the knee including lunges, squats and knee extension machine at the gym.  She was advised we will work with more straight line straight leg stability and hip stability.  We will disregard previous HEP   Exercises - Supine Bridge  - 1 x daily - 7 x weekly - 3 sets - 10 reps - Supine Active Straight Leg Raise  - 1 x daily - 7 x weekly - 3 sets - 8 reps - Single Leg Stance with Diagonal Forward Reach  - 1 x daily - 7 x weekly - 3 sets - 10 reps - Side Stepping with Resistance at Ankles  - 1 x daily - 7 x weekly - 3 sets - 10 reps   PATIENT EDUCATION:  Education details: HEP, symptom management  Person educated: Patient Education method: Explanation, Demonstration, Tactile cues, Verbal cues, and Handouts Education comprehension: verbalized understanding, returned demonstration, verbal cues required, tactile cues required, and needs further education  HOME EXERCISE PROGRAM:   Eval: Access Code: [email or web address] URL: [email or web address] Date: 03/03/2023 Prepared by: Lorayne Bender  Exercises - Supine Bridge  - 1 x daily - 7 x weekly - 3 sets - 10 reps - Supine Active Straight Leg Raise  - 1 x daily - 7 x weekly - 3 sets - 8 reps - Single Leg Stance with Diagonal Forward Reach  - 1 x daily - 7 x weekly - 3 sets - 10 reps - Side Stepping with Resistance at Ankles  - 1 x daily - 7 x weekly - 3 sets - 10 reps  ASSESSMENT:  CLINICAL IMPRESSION: The patient continues to progress. We increased the tension on her band and she was able to advance her number of SLR.  She was advised to be patient. It will take time to improve strength. She began instability training as well. She had mild pain with lateral instability training but it was also the end of her session. We will begin working on step downs next session. She was advised to keep her RPE around a 5-6.    Eval:Patient is a 52 year old female with left knee pain.  She has increased pain when she crosses her leg.  She is an avid runner and would like to get back to running.  Running causes her increased pain.  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  She has a valgus movement when squatting on the left.  No valgus on the left with squatting.  She would benefit from skilled therapy to improve left strength and stability in order to return to running and regular fitness routine. OBJECTIVE IMPAIRMENTS: decreased mobility, decreased strength, and pain.   ACTIVITY LIMITATIONS: squatting, stairs, and running   PARTICIPATION LIMITATIONS: occupation and yard work  PERSONAL FACTORS: 1-2 comorbidities: anxiety, prior menisectomy   are also affecting patient's functional outcome.  REHAB POTENTIAL: Good  CLINICAL DECISION MAKING: Stable/uncomplicated  EVALUATION COMPLEXITY: Low   GOALS: Goals reviewed with patient? Yes  SHORT TERM GOALS: Target date: 03/31/2023   Patient will increase gross left lower extremity strength by 5 pounds Baseline: Goal status: INITIAL  2.  Patient will demonstrate equal single-leg stance time left versus right Baseline:  Goal status: INITIAL  3.  Patient will have a be independent with basic HEP Baseline:  Goal status: INITIAL  LONG TERM GOALS: Target date: 04/28/2023    Patient will get down in a deep kneeling position without increased pain in order to perform yoga and similar type of activities Baseline:  Goal status: INITIAL  2.   Patient will return to running without pain Baseline:  Goal status: INITIAL  3.  Patient will return to Pilates exercises without increased pain Baseline:  Goal status: INITIAL   PLAN:  PT FREQUENCY: 1-2x/week  PT DURATION: 8 weeks  PLANNED INTERVENTIONS: Therapeutic exercises, Therapeutic activity, Neuromuscular re-education, Balance training, Gait training, Patient/Family education, Self Care, Joint mobilization, Stair training, DME instructions, Aquatic Therapy, Dry Needling, Electrical stimulation, Cryotherapy, Moist heat, Taping, Manual therapy, and Re-evaluation.   PLAN FOR NEXT SESSION:  Assess squat next visit. See if it has improved; progress current exercises as tolerated. Consider side lying series of exercises of the hips. Progress cone drill if able. Consider cable walk; consider 2 inch step down. She had this on her previous HEP but likley was too high. She also had leg press. Consider reviewing how to [facility] that effective.   Referring diagnosis?  M25.562,G89.29 (ICD-10-CM) - Chronic pain of left knee  M22.2X2 (ICD-10-CM) - Patellofemoral pain syndrome of left knee   Treatment diagnosis? (if different than referring diagnosis)  What was this (referring dx) caused by? [x]  Surgery []  Fall []  Ongoing issue []  Arthritis []  Other: ____________  Laterality: []  Rt []  Lt []  Both  Check all possible CPT codes:  *CHOOSE 10 OR LESS*    []  40981 (Therapeutic Exercise)  []  92507 (SLP Treatment)  []  97112 (Neuro Re-ed)   []  92526 (Swallowing Treatment)   []  97116 (Gait Training)   []  [id number] (Cognitive Training, 1st 15 minutes) []  97140 (Manual Therapy)   []  97130 (Cognitive Training, each add'l 15 minutes)  []  97164 (Re-evaluation)                              []  Other, List CPT Code ____________  []  97530 (Therapeutic Activities)     []  97535 (Self Care)   [x]  All codes above (97110 - 97535)  []  97012 (Mechanical Traction)  []  97014 (E-stim Unattended)  []  97032 (E-stim  manual)  []  97033 (Ionto)  []  97035 (Ultrasound) []  97750 (Physical Performance Training) []  [id number] (Aquatic Therapy) []  97016 (Vasopneumatic Device) []  [id number] (Paraffin) []  97034 (Contrast Bath) []  97597 (Wound Care 1st 20 sq cm) []  97598 (Wound Care each add'l 20 sq cm) []  97760 (Orthotic Fabrication, Fitting, Training Initial) []  [id number] (Prosthetic Management and Training Initial) []  [id number] (Orthotic or Prosthetic Training/ Modification Subsequent)   Dessie Coma, PT 04/05/2023, 8:39 AM

## 2023-04-05 ENCOUNTER — Encounter (HOSPITAL_BASED_OUTPATIENT_CLINIC_OR_DEPARTMENT_OTHER): Payer: Self-pay | Admitting: Physical Therapy

## 2023-04-11 ENCOUNTER — Encounter (HOSPITAL_BASED_OUTPATIENT_CLINIC_OR_DEPARTMENT_OTHER): Payer: Medicare PPO | Admitting: Physical Therapy

## 2023-04-18 ENCOUNTER — Encounter (HOSPITAL_BASED_OUTPATIENT_CLINIC_OR_DEPARTMENT_OTHER): Payer: Self-pay | Admitting: Physical Therapy

## 2023-04-18 ENCOUNTER — Ambulatory Visit (HOSPITAL_BASED_OUTPATIENT_CLINIC_OR_DEPARTMENT_OTHER): Payer: Medicare PPO | Admitting: Physical Therapy

## 2023-04-18 DIAGNOSIS — R2689 Other abnormalities of gait and mobility: Secondary | ICD-10-CM

## 2023-04-18 DIAGNOSIS — G8929 Other chronic pain: Secondary | ICD-10-CM

## 2023-04-18 DIAGNOSIS — M25562 Pain in left knee: Secondary | ICD-10-CM | POA: Diagnosis not present

## 2023-04-18 NOTE — Therapy (Signed)
OUTPATIENT PHYSICAL THERAPY LOWER EXTREMITY EVALUATION   Patient Name: Alicia Logan MRN: [id number] DOB:12-30-1970, 52 y.o., female Today's Date: 04/18/2023  END OF SESSION:      Past Medical History:  Diagnosis Date   Anxiety    Arthritis    Bipolar 1 disorder (HCC)    Chronic back pain    Chronic diarrhea 08/2020   Depression    Headache    Substance abuse [facility])    Past Surgical History:  Procedure Laterality Date   ARTHROSCOPIC REPAIR PCL     DENTAL SURGERY     KNEE ARTHROSCOPY WITH LATERAL MENISECTOMY Left 04/20/2022   Procedure: KNEE ARTHROSCOPY WITH PARTIAL  lateral MENISECTOMY;  Surgeon: Yolonda Kida, MD;  Location: [facility];  Service: Orthopedics;  Laterality: Left;   NASAL SEPTUM SURGERY  07/2017   SPINAL CORD STIMULATOR INSERTION N/A 05/18/2016   Procedure: LUMBAR SPINAL CORD STIMULATOR INSERTION;  Surgeon: Venita Lick, MD;  Location: [facility];  Service: Orthopedics;  Laterality: N/A;   Patient Active Problem List   Diagnosis Date Noted   Bipolar disorder, curr episode depressed, severe, w/psychotic features (HCC) 06/30/2016   Moderate benzodiazepine use disorder (HCC) 06/30/2016   Opioid use disorder, moderate, dependence (HCC) 06/30/2016   Stimulant use disorder 06/30/2016   Chronic pain syndrome 06/30/2016   History of back surgery 06/30/2016   S/P insertion of spinal cord stimulator 06/30/2016   Polysubstance dependence (HCC) 06/29/2016   Major depressive disorder, recurrent, moderate (HCC) 06/29/2016   Chronic pain 05/18/2016   Viral illness 11/26/2011   Depression 11/26/2011   Chronic lower back pain 11/26/2011    PCP: No PCP   REFERRING PROVIDER: Dr Huel Cote  REFERRING DIAG:  Diagnosis  M25.562,G89.29 (ICD-10-CM) - Chronic pain of left knee  M22.2X2 (ICD-10-CM) - Patellofemoral pain syndrome of left knee    THERAPY DIAG:  No diagnosis found.  Rationale for Evaluation and Treatment: Rehabilitation  ONSET DATE:    SUBJECTIVE:   SUBJECTIVE STATEMENT: The patient has had some mental health issues she has been dealing with. She has been seeing a [employer]ar[employer] manager. She went and did an activity yesterday that caused some pain and swelling. She has also been to the beach. She did well on the beach. She was able to do her exercises.    Patient had a knee arthroscopy and meniscal debridement on 04/21/2023.  She was told that she can go back to full activity after 2 weeks.  She gradually increase her activity but over that timeframe her pain began to increase.  She went to physical therapy for 6 weeks with no significant improvement in pain.  She returned to the doctor told her just not to run or jump anymore.  She is an avid runner and would like to continue running.  She went for a second opinion.  She was sent over here for physical therapy.  PERTINENT HISTORY: Anxiety, depression, bipolar 1 disorder, substance abuse, headaches spinal cord stimulator insertion 2017, arthroscopic repair of PCL unspecified date and knee. PAIN:  Are you having pain? Yes: NPRS scale: 5/10 Pain location: Medial knee Pain description: Aching Aggravating factors: Crossing legs and deep bending Relieving factors: Not getting in these positions  PRECAUTIONS: Spinal stimulator   RED FLAGS: None    WEIGHT BEARING RESTRICTIONS: No  FALLS:  Has patient fallen in last 6 months? Yes. Number of falls Legs buckles but has not fallen the floor  LIVING ENVIRONMENT: Does not have steps at her house. Has to go up  to visit her neighbor  OCCUPATION:   Dentist  Hobbies:  Running, exercises      PLOF: Independent  PATIENT GOALS:  To have less pain   NEXT MD VISIT:    OBJECTIVE:   DIAGNOSTIC FINDINGS:  Nothing post op   PATIENT SURVEYS:  FOTO    COGNITION: Overall cognitive status: Within functional limits for tasks assessed     SENSATION: WFL  EDEMA:  No noticeable edema MUSCLE LENGTH:  POSTURE: No  Significant postural limitations  PALPATION: No significant tenderness to palpation   LOWER EXTREMITY ROM:  Passive ROM Right eval Left eval  Hip flexion    Hip extension    Hip abduction    Hip adduction    Hip internal rotation    Hip external rotation    Knee flexion  Pain with loaded full flexion   Knee extension    Ankle dorsiflexion    Ankle plantarflexion    Ankle inversion    Ankle eversion     (Blank rows = not tested)  LOWER EXTREMITY MMT:  MMT Right eval Left eval  Hip flexion 31.4 24.6  Hip extension    Hip abduction 26.5 26.6  Hip adduction    Hip internal rotation    Hip external rotation    Knee flexion    Knee extension 42.2 34.7  Ankle dorsiflexion    Ankle plantarflexion    Ankle inversion    Ankle eversion     (Blank rows = not tested)    FUNCTIONAL TESTS:  Squat: considerable knee valgus on the left   Single leg stance minor instability with knee unlcoked on the left   GAIT: No significant gait deviations noted.  With patient's stability starts to improve we will do a running analysis  TODAY'S TREATMENT:                                                                                                                              DATE:  09/25 SLR 15, 15, 15   Step onto air-ex forward and lateral 2x10   Manual: garde II and III PA and AP glides for pain and inflamation;   Heel raises 2x15     9/11 SLR 15, 12, 10  Bridge 14 14, 14 with  green band   Cable walk posterior x10 10 lbs to start then moved to 15.   Reviewed HEP  Step onto air-ex forward and lateral 2x10   Mild pain going lateral    9/5 SLR 15, 10, 10   Bridge 14 14, 14 with red band   Single leg march 2x10 could feel mild pain   Cable walk posterior x10 10 lbs to start then moved to 15.   Reviewed HEP. Cone Drill taken out.       8/28 Ex bike 5 min   SLR 10,7, 8 RPE of 5-7   Bridge 14, 14, 14 red band RPE 6  Cone drill 2x7 with knee locked  RPE up towards 8 so exercise stopped   Step onto air-ex x10   Manual: Mcconell patella stabilization     Eval: Assessment of previous HEP.  Patient's previous HEP had 11 different exercises that she was to do.  7-8 of these exercises involved bending the knee including lunges, squats and knee extension machine at the gym.  She was advised we will work with more straight line straight leg stability and hip stability.  We will disregard previous HEP   Exercises - Supine Bridge  - 1 x daily - 7 x weekly - 3 sets - 10 reps - Supine Active Straight Leg Raise  - 1 x daily - 7 x weekly - 3 sets - 8 reps - Single Leg Stance with Diagonal Forward Reach  - 1 x daily - 7 x weekly - 3 sets - 10 reps - Side Stepping with Resistance at Ankles  - 1 x daily - 7 x weekly - 3 sets - 10 reps   PATIENT EDUCATION:  Education details: HEP, symptom management  Person educated: Patient Education method: Explanation, Demonstration, Tactile cues, Verbal cues, and Handouts Education comprehension: verbalized understanding, returned demonstration, verbal cues required, tactile cues required, and needs further education  HOME EXERCISE PROGRAM:   Eval: Access Code: [email or web address] URL: [email or web address] Date: 03/03/2023 Prepared by: Lorayne Bender  Exercises - Supine Bridge  - 1 x daily - 7 x weekly - 3 sets - 10 reps - Supine Active Straight Leg Raise  - 1 x daily - 7 x weekly - 3 sets - 8 reps - Single Leg Stance with Diagonal Forward Reach  - 1 x daily - 7 x weekly - 3 sets - 10 reps - Side Stepping with Resistance at Ankles  - 1 x daily - 7 x weekly - 3 sets - 10 reps  ASSESSMENT:  CLINICAL IMPRESSION: Despite baseline swelling the patient tolerated treatment well. We worked more on straight knee strengthening exercises. She showed improved stability stepping on  an air-ex mat. We also worked on manual therapy to reduce pain and inflammation.   Eval:Patient is a 52 year old female with  left knee pain.  She has increased pain when she crosses her leg.  She is an avid runner and would like to get back to running.  Running causes her increased pain.  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  She has a valgus movement when squatting on the left.  No valgus on the left with squatting.  She would benefit from skilled therapy to improve left strength and stability in order to return to running and regular fitness routine. OBJECTIVE IMPAIRMENTS: decreased mobility, decreased strength, and pain.   ACTIVITY LIMITATIONS: squatting, stairs, and running   PARTICIPATION LIMITATIONS: occupation and yard work  PERSONAL FACTORS: 1-2 comorbidities: anxiety, prior menisectomy   are also affecting patient's functional outcome.   REHAB POTENTIAL: Good  CLINICAL DECISION MAKING: Stable/uncomplicated  EVALUATION COMPLEXITY: Low   GOALS: Goals reviewed with patient? Yes  SHORT TERM GOALS: Target date: 03/31/2023   Patient will increase gross left lower extremity strength by 5 pounds Baseline: Goal status: INITIAL  2.  Patient will demonstrate equal single-leg stance time left versus right Baseline:  Goal status: INITIAL  3.  Patient will have a be independent with basic HEP Baseline:  Goal status: INITIAL  LONG TERM  GOALS: Target date: 04/28/2023    Patient will get down in a deep kneeling position without increased pain in order to perform yoga and similar type of activities Baseline:  Goal status: INITIAL  2.  Patient will return to running without pain Baseline:  Goal status: INITIAL  3.  Patient will return to Pilates exercises without increased pain Baseline:  Goal status: INITIAL   PLAN:  PT FREQUENCY: 1-2x/week  PT DURATION: 8 weeks  PLANNED INTERVENTIONS: Therapeutic exercises, Therapeutic activity, Neuromuscular re-education, Balance  training, Gait training, Patient/Family education, Self Care, Joint mobilization, Stair training, DME instructions, Aquatic Therapy, Dry Needling, Electrical stimulation, Cryotherapy, Moist heat, Taping, Manual therapy, and Re-evaluation.   PLAN FOR NEXT SESSION:  Assess squat next visit. See if it has improved; progress current exercises as tolerated. Consider side lying series of exercises of the hips. Progress cone drill if able. Consider cable walk; consider 2 inch step down. She had this on her previous HEP but likley was too high. She also had leg press. Consider reviewing how to [facility] that effective.   Referring diagnosis?  M25.562,G89.29 (ICD-10-CM) - Chronic pain of left knee  M22.2X2 (ICD-10-CM) - Patellofemoral pain syndrome of left knee   Treatment diagnosis? (if different than referring diagnosis)  What was this (referring dx) caused by? [x]  Surgery []  Fall []  Ongoing issue []  Arthritis []  Other: ____________  Laterality: []  Rt []  Lt []  Both  Check all possible CPT codes:  *CHOOSE 10 OR LESS*    []  97110 (Therapeutic Exercise)  []  91478 (SLP Treatment)  []  97112 (Neuro Re-ed)   []  92526 (Swallowing Treatment)   []  29562 (Gait Training)   []  [id number] (Cognitive Training, 1st 15 minutes) []  97140 (Manual Therapy)   []  97130 (Cognitive Training, each add'l 15 minutes)  []  97164 (Re-evaluation)                              []  Other, List CPT Code ____________  []  97530 (Therapeutic Activities)     []  97535 (Self Care)   [x]  All codes above (97110 - 97535)  []  97012 (Mechanical Traction)  []  97014 (E-stim Unattended)  []  97032 (E-stim manual)  []  97033 (Ionto)  []  97035 (Ultrasound) []  97750 (Physical Performance Training) []  [id number] (Aquatic Therapy) []  97016 (Vasopneumatic Device) []  [id number] (Paraffin) []  97034 (Contrast Bath) []  97597 (Wound Care 1st 20 sq cm) []  97598 (Wound Care each add'l 20 sq cm) []  97760 (Orthotic Fabrication, Fitting, Training Initial) []  [id number]  (Prosthetic Management and Training Initial) []  [id number] (Orthotic or Prosthetic Training/ Modification Subsequent)   Dessie Coma, PT 04/18/2023, 10:25 AM

## 2023-04-23 ENCOUNTER — Ambulatory Visit: Payer: Medicare PPO | Admitting: Urology

## 2023-04-23 ENCOUNTER — Encounter: Payer: Self-pay | Admitting: Urology

## 2023-04-23 VITALS — BP 134/85 | HR 98 | Ht 69.0 in | Wt 139.0 lb

## 2023-04-23 DIAGNOSIS — N3941 Urge incontinence: Secondary | ICD-10-CM | POA: Diagnosis not present

## 2023-04-23 NOTE — Progress Notes (Signed)
04/23/2023 1:29 PM   Alicia Logan [birth year] [id number]  Referring provider: No referring provider defined for this encounter.  Chief Complaint  Patient presents with   Follow-up   Urinary Incontinence    HPI: I was consulted to assess the patient's urinary incontinence worse in last several weeks.  She has urge incontinence especially when she drives from [place] early in the morning to [facility] to Pilates.  She has to pull over.  She wonders if it is due to coffee which she is reduced.  Her bladder may have been more urgent 6 months ago but she has been going through a lot with anxiety and knee issues.  She is also having a 2-year history of diarrhea which is upsetting   She wears 1 pad a day when she goes out in public and it can be damp.  She does not have stress incontinence with Pilates coughing sneezing or bending.  No bedwetting   Flow is reasonable.  She does not necessarily feel empty.  She double voids a small amount.  She will feel urgency and void a small amount.   She has a spinal cord stimulator for low back pain and has had a back procedure and is not using the stimulator   On pelvic examination patient had mild grade 2 hypermobility bladder neck and a mild grade 1 cystocele asymptomatic. No stress incontinence.    Patient has frequency and urgency and some urge incontinence especially in last few weeks.  The patient may have a bladder infection based upon urinalysis and will call if culture positive.  If this was the diagnosis she could stop all therapy.  The role of medical and behavioral therapy discussed in detail as well as future cystoscopy for safety reasons.  No blood in urine today   The patient is actually leaving work because of the urgency is so severe and going to public restrooms because of a going restroom.  She wanted me to call in Macrodantin 100 mg twice a day for 7 days.  Call if culture differs.  Of course if this works she will take the  Myrbetriq 50 mg samples and prescription.  I will also cancel the cystoscopy in 6 weeks if she has normalized and had a positive UTI   Culture negative. Patient was doing much better on Myrbetriq but efficacy decreased.  Still having a little bit of leaking but is overall doing better. No stress incontinence and repeat pelvic examination Cystoscopy: normal   reassess in 6 weeks on Gemtesa samples and prescription.  Encourage physical therapy.  We can always order urodynamics depending on her progress.   Patient once again responded to the [facility] and was pleased with the samples.  She said the effects are wearing off again.  When she does a long drive she has to wear a pad or she would wet through her close.  Clinically not infected.  Frequency stable    Reassess in 6 weeks on oxybutynin ER 10 mg 30 x 11.  I am going to have her see our physical therapy in [place].  I can always order urodynamics.  She lives in [place] and this is more convenient   Today Dramatically better.  Has cut out caffeine and she thinks has helped.  Saw our physical therapist once and is very pleased.  Much less frequency urgency and leaking.  Very happy.  No infections   PMH: Past Medical History:  Diagnosis Date   Anxiety  Arthritis    Bipolar 1 disorder (HCC)    Chronic back pain    Chronic diarrhea 08/2020   Depression    Headache    Substance abuse [facility])     Surgical History: Past Surgical History:  Procedure Laterality Date   ARTHROSCOPIC REPAIR PCL     DENTAL SURGERY     KNEE ARTHROSCOPY WITH LATERAL MENISECTOMY Left 04/20/2022   Procedure: KNEE ARTHROSCOPY WITH PARTIAL  lateral MENISECTOMY;  Surgeon: Yolonda Kida, MD;  Location: [facility];  Service: Orthopedics;  Laterality: Left;   NASAL SEPTUM SURGERY  07/2017   SPINAL CORD STIMULATOR INSERTION N/A 05/18/2016   Procedure: LUMBAR SPINAL CORD STIMULATOR INSERTION;  Surgeon: Venita Lick, MD;  Location: [facility];  Service: Orthopedics;   Laterality: N/A;    Home Medications:  Allergies as of 04/23/2023       Reactions   Singulair [montelukast] Other (See Comments)   Syncope when used with her psych meds   Neomycin Rash   Prograf [tacrolimus] Rash   Sulfa Antibiotics Nausea And Vomiting, Rash        Medication List        Accurate as of April 23, 2023  1:29 PM. If you have any questions, ask your nurse or doctor.          acetaminophen 500 MG tablet Commonly known as: TYLENOL Take 1,000 mg by mouth every 6 (six) hours as needed for mild pain or moderate pain.   buPROPion 150 MG 24 hr tablet Commonly known as: WELLBUTRIN XL Take 150 mg by mouth every morning.   diphenoxylate-atropine 2.5-0.025 MG tablet Commonly known as: LOMOTIL TAKE 1 TO 2 TABLETS BY MOUTH FOUR TIMES A DAY AS NEEDED DECREASE AS DIRECTED   EPINEPHrine 0.3 mg/0.3 mL Soaj injection Commonly known as: EPI-PEN Inject 0.3 mg into the muscle as needed for anaphylaxis.   gabapentin 300 MG capsule Commonly known as: NEURONTIN Take 1 capsule (300 mg total) by mouth 3 (three) times daily.   lurasidone 40 MG Tabs tablet Commonly known as: LATUDA Take 40 mg by mouth at bedtime.   medroxyPROGESTERone 150 MG/ML injection Commonly known as: DEPO-PROVERA Inject 1 mL (150 mg total) into the muscle every 3 (three) months.   oxybutynin 10 MG 24 hr tablet Commonly known as: DITROPAN-XL Take 1 tablet (10 mg total) by mouth daily.   QUEtiapine 25 MG tablet Commonly known as: SEROQUEL Take 25 mg by mouth at bedtime.        Allergies:  Allergies  Allergen Reactions   Singulair [Montelukast] Other (See Comments)    Syncope when used with her psych meds   Neomycin Rash   Prograf [Tacrolimus] Rash   Sulfa Antibiotics Nausea And Vomiting and Rash    Family History: Family History  Problem Relation Age of Onset   Depression Mother    Hypertension Father    Heart failure Father    Stroke Father    Colon cancer Maternal  Grandmother    Esophageal cancer Maternal Grandfather     Social History:  reports that she has quit smoking. Her smoking use included cigarettes. She has a 10 pack-year smoking history. She has been exposed to tobacco smoke. She has never used smokeless tobacco. She reports current drug use. Drug: Marijuana. She reports that she does not drink alcohol.  ROS:  Physical Exam: BP 134/85   Pulse 98   Ht 5\' 9"  (1.753 m)   Wt 63 kg   BMI 20.53 kg/m   Constitutional:  Alert and oriented, No acute distress. HEENT:  AT, moist mucus membranes.  Trachea midline, no masses.   Laboratory Data: Lab Results  Component Value Date   WBC 7.2 04/07/2022   HGB 12.2 04/07/2022   HCT 36.9 04/07/2022   MCV 94.4 04/07/2022   PLT 192 04/07/2022    Lab Results  Component Value Date   CREATININE 0.96 04/08/2021    No results found for: "PSA"  No results found for: "TESTOSTERONE"  Lab Results  Component Value Date   HGBA1C 5.2 07/01/2016    Urinalysis    Component Value Date/Time   COLORURINE YELLOW 07/01/2016 1315   APPEARANCEUR Clear 02/19/2023 0826   LABSPEC 1.026 07/01/2016 1315   PHURINE 6.0 07/01/2016 1315   GLUCOSEU Negative 02/19/2023 0826   HGBUR NEGATIVE 07/01/2016 1315   BILIRUBINUR Negative 02/19/2023 0826   KETONESUR 20 (A) 07/01/2016 1315   PROTEINUR Negative 02/19/2023 0826   PROTEINUR NEGATIVE 07/01/2016 1315   UROBILINOGEN 1.0 12/02/2012 1003   NITRITE Negative 02/19/2023 0826   NITRITE NEGATIVE 07/01/2016 1315   LEUKOCYTESUR Negative 02/19/2023 0826    Pertinent Imaging:   Assessment & Plan: Reassess durability in 4 months  1. Urgency incontinence  - Urinalysis, Complete   No follow-ups on file.  Martina Sinner, MD  [facility] [address], [address] [place], [place] [address] [phone]

## 2023-04-24 ENCOUNTER — Encounter (HOSPITAL_BASED_OUTPATIENT_CLINIC_OR_DEPARTMENT_OTHER): Payer: Self-pay | Admitting: Physical Therapy

## 2023-04-24 ENCOUNTER — Ambulatory Visit (HOSPITAL_BASED_OUTPATIENT_CLINIC_OR_DEPARTMENT_OTHER): Payer: Medicare PPO | Attending: Orthopaedic Surgery | Admitting: Physical Therapy

## 2023-04-24 DIAGNOSIS — G8929 Other chronic pain: Secondary | ICD-10-CM | POA: Diagnosis present

## 2023-04-24 DIAGNOSIS — M25562 Pain in left knee: Secondary | ICD-10-CM | POA: Diagnosis present

## 2023-04-24 DIAGNOSIS — R2689 Other abnormalities of gait and mobility: Secondary | ICD-10-CM | POA: Insufficient documentation

## 2023-04-24 LAB — URINALYSIS, COMPLETE
Bilirubin, UA: NEGATIVE
Glucose, UA: NEGATIVE
Nitrite, UA: NEGATIVE
Specific Gravity, UA: >1.03 — ABNORMAL HIGH (ref 1.005–1.030)
Urobilinogen, Ur: 1 mg/dL (ref 0.2–1.0)
pH, UA: 6 (ref 5.0–7.5)

## 2023-04-24 LAB — MICROSCOPIC EXAMINATION: WBC, UA: >30 /[HPF] — AB (ref 0–5)

## 2023-04-24 NOTE — Therapy (Signed)
OUTPATIENT PHYSICAL THERAPY LOWER EXTREMITY EVALUATION   Patient Name: Alicia Logan MRN: [id number] DOB:[birth year], 52 y.o., female Today's Date: 04/24/2023  END OF SESSION:  PT End of Session - 04/24/23 0806     Visit Number 6    Number of Visits 8    Date for PT Re-Evaluation 04/28/23    PT Start Time 0801    PT Stop Time 0843    PT Time Calculation (min) 42 min    Activity Tolerance Patient tolerated treatment well    Behavior During Therapy [facility] for tasks assessed/performed                Past Medical History:  Diagnosis Date   Anxiety    Arthritis    Bipolar 1 disorder (HCC)    Chronic back pain    Chronic diarrhea 08/2020   Depression    Headache    Substance abuse [facility])    Past Surgical History:  Procedure Laterality Date   ARTHROSCOPIC REPAIR PCL     DENTAL SURGERY     KNEE ARTHROSCOPY WITH LATERAL MENISECTOMY Left 04/20/2022   Procedure: KNEE ARTHROSCOPY WITH PARTIAL  lateral MENISECTOMY;  Surgeon: Yolonda Kida, MD;  Location: [facility];  Service: Orthopedics;  Laterality: Left;   NASAL SEPTUM SURGERY  07/2017   SPINAL CORD STIMULATOR INSERTION N/A 05/18/2016   Procedure: LUMBAR SPINAL CORD STIMULATOR INSERTION;  Surgeon: Venita Lick, MD;  Location: [facility] OR;  Service: Orthopedics;  Laterality: N/A;   Patient Active Problem List   Diagnosis Date Noted   Bipolar disorder, curr episode depressed, severe, w/psychotic features (HCC) 06/30/2016   Moderate benzodiazepine use disorder (HCC) 06/30/2016   Opioid use disorder, moderate, dependence (HCC) 06/30/2016   Stimulant use disorder 06/30/2016   Chronic pain syndrome 06/30/2016   History of back surgery 06/30/2016   S/P insertion of spinal cord stimulator 06/30/2016   Polysubstance dependence (HCC) 06/29/2016   Major depressive disorder, recurrent, moderate (HCC) 06/29/2016   Chronic pain 05/18/2016   Viral illness 11/26/2011   Depression 11/26/2011   Chronic lower back pain 11/26/2011     PCP: No PCP   REFERRING PROVIDER: Dr Huel Cote  REFERRING DIAG:  Diagnosis  M25.562,G89.29 (ICD-10-CM) - Chronic pain of left knee  M22.2X2 (ICD-10-CM) - Patellofemoral pain syndrome of left knee    THERAPY DIAG:  Chronic pain of left knee  Other abnormalities of gait and mobility  Rationale for Evaluation and Treatment: Rehabilitation  ONSET DATE:   SUBJECTIVE:   SUBJECTIVE STATEMENT: The patient continues to deal with some counseling issues. She is trying to find the right counselor. She feels like she can squat a little deeper and walk a little longer.    Patient had a knee arthroscopy and meniscal debridement on 04/21/2023.  She was told that she can go back to full activity after 2 weeks.  She gradually increase her activity but over that timeframe her pain began to increase.  She went to physical therapy for 6 weeks with no significant improvement in pain.  She returned to the doctor told her just not to run or jump anymore.  She is an avid runner and would like to continue running.  She went for a second opinion.  She was sent over here for physical therapy.  PERTINENT HISTORY: Anxiety, depression, bipolar 1 disorder, substance abuse, headaches spinal cord stimulator insertion 2017, arthroscopic repair of PCL unspecified date and knee. PAIN:  Are you having pain? Yes: NPRS scale: 5/10 Pain location: Medial  knee Pain description: Aching Aggravating factors: Crossing legs and deep bending Relieving factors: Not getting in these positions  PRECAUTIONS: Spinal stimulator   RED FLAGS: None    WEIGHT BEARING RESTRICTIONS: No  FALLS:  Has patient fallen in last 6 months? Yes. Number of falls Legs buckles but has not fallen the floor  LIVING ENVIRONMENT: Does not have steps at her house. Has to go up to visit her neighbor  OCCUPATION:   Dentist  Hobbies:  Running, exercises      PLOF: Independent  PATIENT GOALS:  To have less pain    NEXT MD VISIT:    OBJECTIVE:   DIAGNOSTIC FINDINGS:  Nothing post op   PATIENT SURVEYS:  FOTO    COGNITION: Overall cognitive status: Within functional limits for tasks assessed     SENSATION: WFL  EDEMA:  No noticeable edema MUSCLE LENGTH:  POSTURE: No Significant postural limitations  PALPATION: No significant tenderness to palpation   LOWER EXTREMITY ROM:  Passive ROM Right eval Left eval  Hip flexion    Hip extension    Hip abduction    Hip adduction    Hip internal rotation    Hip external rotation    Knee flexion  Pain with loaded full flexion   Knee extension    Ankle dorsiflexion    Ankle plantarflexion    Ankle inversion    Ankle eversion     (Blank rows = not tested)  LOWER EXTREMITY MMT:  MMT Right eval Left eval Right  Left   Hip flexion 31.4 24.6 31.8 27.6  Hip extension      Hip abduction 26.5 26.6 33.6 34.5  Hip adduction      Hip internal rotation      Hip external rotation      Knee flexion      Knee extension 42.2 34.7 52.3 39.7  Ankle dorsiflexion      Ankle plantarflexion      Ankle inversion      Ankle eversion       (Blank rows = not tested)    FUNCTIONAL TESTS:  Squat: considerable knee valgus on the left   Single leg stance minor instability with knee unlcoked on the left   GAIT: No significant gait deviations noted.  With patient's stability starts to improve we will do a running analysis  TODAY'S TREATMENT:                                                                                                                              DATE:  10/1   SLR 1 lbs 10, 8, 7    Bridge 15, 12 10 green band   Leg press  40 lbs x10 50 lbs 2x10   Reviewed goals and tests and measures.    09/25 SLR 15, 15, 15   Step onto air-ex forward and lateral 2x10   Manual: garde II and III PA and AP glides for  pain and inflamation;   Heel raises 2x15     9/11 SLR 15, 12, 10  Bridge 14 14, 14 with  green band    Cable walk posterior x10 10 lbs to start then moved to 15.   Reviewed HEP  Step onto air-ex forward and lateral 2x10   Mild pain going lateral    9/5 SLR 15, 10, 10   Bridge 14 14, 14 with red band   Single leg march 2x10 could feel mild pain   Cable walk posterior x10 10 lbs to start then moved to 15.   Reviewed HEP. Cone Drill taken out.       8/28 Ex bike 5 min   SLR 10,7, 8 RPE of 5-7   Bridge 14, 14, 14 red band RPE 6   Cone drill 2x7 with knee locked RPE up towards 8 so exercise stopped   Step onto air-ex x10   Manual: Mcconell patella stabilization      PATIENT EDUCATION:  Education details: HEP, symptom management  Person educated: Patient Education method: Explanation, Demonstration, Tactile cues, Verbal cues, and Handouts Education comprehension: verbalized understanding, returned demonstration, verbal cues required, tactile cues required, and needs further education  HOME EXERCISE PROGRAM:   Eval: Access Code: [email or web address] URL: [email or web address] Date: 03/03/2023 Prepared by: Lorayne Bender  Exercises - Supine Bridge  - 1 x daily - 7 x weekly - 3 sets - 10 reps - Supine Active Straight Leg Raise  - 1 x daily - 7 x weekly - 3 sets - 8 reps - Single Leg Stance with Diagonal Forward Reach  - 1 x daily - 7 x weekly - 3 sets - 10 reps - Side Stepping with Resistance at Ankles  - 1 x daily - 7 x weekly - 3 sets - 10 reps  ASSESSMENT:  CLINICAL IMPRESSION:  The patient continues to have pain and swelling but overall her strength and function have improved. Her FOTO score has decreased but some of her baseline answers may not have been accurate. We have been able to consistently advance her exercises despite pain and swelling. She has some other things . We will continue 6 weeks to continue progression.   Eval:Patient is a 52 year old female with left knee pain.  She has increased pain when she crosses her leg.  She is an avid  runner and would like to get back to running.  Running causes her increased pain.  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  She has a valgus movement when squatting on the left.  No valgus on the left with squatting.  She would benefit from skilled therapy to improve left strength and stability in order to return to running and regular fitness routine. OBJECTIVE IMPAIRMENTS: decreased mobility, decreased strength, and pain.   ACTIVITY LIMITATIONS: squatting, stairs, and running   PARTICIPATION LIMITATIONS: occupation and yard work  PERSONAL FACTORS: 1-2 comorbidities: anxiety, prior menisectomy   are also affecting patient's functional outcome.   REHAB POTENTIAL: Good  CLINICAL DECISION MAKING: Stable/uncomplicated  EVALUATION COMPLEXITY: Low   GOALS: Goals reviewed with patient? Yes  SHORT TERM GOALS: Target date: 03/31/2023   Patient will increase gross left lower extremity strength by 5 pounds Baseline: Goal status: progressing 10/1 2.  Patient will demonstrate equal single-leg stance time left versus right Baseline:  Goal status: improved 10/1  3.  Patient will have  a be independent with basic HEP Baseline:  Goal status: achieved 10/1  LONG TERM GOALS: Target date: 04/28/2023    Patient will get down in a deep kneeling position without increased pain in order to perform yoga and similar type of activities Baseline:  Goal status: Improving down dog 10/1  2.  Patient will return to running without pain Baseline:  Goal status: has not started 10/1  3.  Patient will return to Pilates exercises without increased pain Baseline:  Goal status: INITIAL   PLAN:  PT FREQUENCY: 1-2x/week  PT DURATION: 8 weeks  PLANNED INTERVENTIONS: Therapeutic exercises, Therapeutic activity, Neuromuscular re-education, Balance training, Gait training,  Patient/Family education, Self Care, Joint mobilization, Stair training, DME instructions, Aquatic Therapy, Dry Needling, Electrical stimulation, Cryotherapy, Moist heat, Taping, Manual therapy, and Re-evaluation.   PLAN FOR NEXT SESSION:  Assess squat next visit. See if it has improved; progress current exercises as tolerated. Consider side lying series of exercises of the hips. Progress cone drill if able. Consider cable walk; consider 2 inch step down. She had this on her previous HEP but likley was too high. She also had leg press. Consider reviewing how to [facility] that effective.   Referring diagnosis?  M25.562,G89.29 (ICD-10-CM) - Chronic pain of left knee  M22.2X2 (ICD-10-CM) - Patellofemoral pain syndrome of left knee   Treatment diagnosis? (if different than referring diagnosis)  What was this (referring dx) caused by? [x]  Surgery []  Fall []  Ongoing issue []  Arthritis []  Other: ____________  Laterality: []  Rt []  Lt []  Both  Check all possible CPT codes:  *CHOOSE 10 OR LESS*    []  97110 (Therapeutic Exercise)  []  92507 (SLP Treatment)  []  97112 (Neuro Re-ed)   []  92526 (Swallowing Treatment)   []  97116 (Gait Training)   []  [id number] (Cognitive Training, 1st 15 minutes) []  97140 (Manual Therapy)   []  97130 (Cognitive Training, each add'l 15 minutes)  []  97164 (Re-evaluation)                              []  Other, List CPT Code ____________  []  97530 (Therapeutic Activities)     []  97535 (Self Care)   [x]  All codes above (97110 - 97535)  []  97012 (Mechanical Traction)  []  97014 (E-stim Unattended)  []  97032 (E-stim manual)  []  97033 (Ionto)  []  97035 (Ultrasound) []  97750 (Physical Performance Training) []  [id number] (Aquatic Therapy) []  97016 (Vasopneumatic Device) []  [id number] (Paraffin) []  97034 (Contrast Bath) []  97597 (Wound Care 1st 20 sq cm) []  97598 (Wound Care each add'l 20 sq cm) []  97760 (Orthotic Fabrication, Fitting, Training Initial) []  [id number] (Prosthetic Management and  Training Initial) []  [id number] (Orthotic or Prosthetic Training/ Modification Subsequent)   Dessie Coma, PT 04/24/2023, 8:08 AM

## 2023-05-01 ENCOUNTER — Ambulatory Visit (HOSPITAL_BASED_OUTPATIENT_CLINIC_OR_DEPARTMENT_OTHER): Payer: Medicare PPO | Admitting: Physical Therapy

## 2023-05-01 DIAGNOSIS — G8929 Other chronic pain: Secondary | ICD-10-CM

## 2023-05-01 DIAGNOSIS — M25562 Pain in left knee: Secondary | ICD-10-CM | POA: Diagnosis not present

## 2023-05-01 DIAGNOSIS — R2689 Other abnormalities of gait and mobility: Secondary | ICD-10-CM

## 2023-05-01 NOTE — Therapy (Signed)
OUTPATIENT PHYSICAL THERAPY LOWER EXTREMITY EVALUATION   Patient Name: Alicia Logan MRN: [id number] DOB:[birth year], 52 y.o., female Today's Date: 05/01/2023  END OF SESSION:  PT End of Session - 05/01/23 0904     Visit Number 7    Number of Visits 12    Date for PT Re-Evaluation 06/05/23    PT Start Time 0803    PT Stop Time 0843    PT Time Calculation (min) 40 min    Activity Tolerance Patient tolerated treatment well    Behavior During Therapy [facility] for tasks assessed/performed                 Past Medical History:  Diagnosis Date   Anxiety    Arthritis    Bipolar 1 disorder (HCC)    Chronic back pain    Chronic diarrhea 08/2020   Depression    Headache    Substance abuse [facility])    Past Surgical History:  Procedure Laterality Date   ARTHROSCOPIC REPAIR PCL     DENTAL SURGERY     KNEE ARTHROSCOPY WITH LATERAL MENISECTOMY Left 04/20/2022   Procedure: KNEE ARTHROSCOPY WITH PARTIAL  lateral MENISECTOMY;  Surgeon: Yolonda Kida, MD;  Location: [facility];  Service: Orthopedics;  Laterality: Left;   NASAL SEPTUM SURGERY  07/2017   SPINAL CORD STIMULATOR INSERTION N/A 05/18/2016   Procedure: LUMBAR SPINAL CORD STIMULATOR INSERTION;  Surgeon: Venita Lick, MD;  Location: [facility] OR;  Service: Orthopedics;  Laterality: N/A;   Patient Active Problem List   Diagnosis Date Noted   Bipolar disorder, curr episode depressed, severe, w/psychotic features (HCC) 06/30/2016   Moderate benzodiazepine use disorder (HCC) 06/30/2016   Opioid use disorder, moderate, dependence (HCC) 06/30/2016   Stimulant use disorder 06/30/2016   Chronic pain syndrome 06/30/2016   History of back surgery 06/30/2016   S/P insertion of spinal cord stimulator 06/30/2016   Polysubstance dependence (HCC) 06/29/2016   Major depressive disorder, recurrent, moderate (HCC) 06/29/2016   Chronic pain 05/18/2016   Viral illness 11/26/2011   Depression 11/26/2011   Chronic lower back pain 11/26/2011     PCP: No PCP   REFERRING PROVIDER: Dr Huel Cote  REFERRING DIAG:  Diagnosis  M25.562,G89.29 (ICD-10-CM) - Chronic pain of left knee  M22.2X2 (ICD-10-CM) - Patellofemoral pain syndrome of left knee    THERAPY DIAG:  Chronic pain of left knee  Other abnormalities of gait and mobility  Rationale for Evaluation and Treatment: Rehabilitation  ONSET DATE:   SUBJECTIVE:   SUBJECTIVE STATEMENT: The patient feels like she is making some progress. She has been ore diligent with her exercises. She has also been teaching several classes.    Patient had a knee arthroscopy and meniscal debridement on 04/21/2023.  She was told that she can go back to full activity after 2 weeks.  She gradually increase her activity but over that timeframe her pain began to increase.  She went to physical therapy for 6 weeks with no significant improvement in pain.  She returned to the doctor told her just not to run or jump anymore.  She is an avid runner and would like to continue running.  She went for a second opinion.  She was sent over here for physical therapy.  PERTINENT HISTORY: Anxiety, depression, bipolar 1 disorder, substance abuse, headaches spinal cord stimulator insertion 2017, arthroscopic repair of PCL unspecified date and knee. PAIN:  Are you having pain? Yes: NPRS scale: 5/10 Pain location: Medial knee Pain description: Aching Aggravating factors:  Crossing legs and deep bending Relieving factors: Not getting in these positions  PRECAUTIONS: Spinal stimulator   RED FLAGS: None    WEIGHT BEARING RESTRICTIONS: No  FALLS:  Has patient fallen in last 6 months? Yes. Number of falls Legs buckles but has not fallen the floor  LIVING ENVIRONMENT: Does not have steps at her house. Has to go up to visit her neighbor  OCCUPATION:   Dentist  Hobbies:  Running, exercises      PLOF: Independent  PATIENT GOALS:  To have less pain   NEXT MD VISIT:    OBJECTIVE:    DIAGNOSTIC FINDINGS:  Nothing post op   PATIENT SURVEYS:  FOTO    COGNITION: Overall cognitive status: Within functional limits for tasks assessed     SENSATION: WFL  EDEMA:  No noticeable edema MUSCLE LENGTH:  POSTURE: No Significant postural limitations  PALPATION: No significant tenderness to palpation   LOWER EXTREMITY ROM:  Passive ROM Right eval Left eval  Hip flexion    Hip extension    Hip abduction    Hip adduction    Hip internal rotation    Hip external rotation    Knee flexion  Pain with loaded full flexion   Knee extension    Ankle dorsiflexion    Ankle plantarflexion    Ankle inversion    Ankle eversion     (Blank rows = not tested)  LOWER EXTREMITY MMT:  MMT Right eval Left eval Right  Left   Hip flexion 31.4 24.6 31.8 27.6  Hip extension      Hip abduction 26.5 26.6 33.6 34.5  Hip adduction      Hip internal rotation      Hip external rotation      Knee flexion      Knee extension 42.2 34.7 52.3 39.7  Ankle dorsiflexion      Ankle plantarflexion      Ankle inversion      Ankle eversion       (Blank rows = not tested)    FUNCTIONAL TESTS:  Squat: considerable knee valgus on the left   Single leg stance minor instability with knee unlcoked on the left   GAIT: No significant gait deviations noted.  With patient's stability starts to improve we will do a running analysis  TODAY'S TREATMENT:                                                                                                                              DATE:  10/8  SLR no weight X17 2x10 1 lbs   Bridge 3x17 blue   Leg press   50 lbs 2x10  Step onto air-ex x15   Cable walk 15 lbs x5    10/1   SLR 1 lbs 10, 8, 7    Bridge 15, 12 10 green band   Leg press  40 lbs x10 50 lbs 2x10   Reviewed goals and  tests and measures.    09/25 SLR 15, 15, 15   Step onto air-ex forward and lateral 2x10   Manual: garde II and III PA and AP glides for pain and  inflamation;   Heel raises 2x15     9/11 SLR 15, 12, 10  Bridge 14 14, 14 with  green band   Cable walk posterior x10 10 lbs to start then moved to 15.   Reviewed HEP  Step onto air-ex forward and lateral 2x10   Mild pain going lateral    9/5 SLR 15, 10, 10   Bridge 14 14, 14 with red band   Single leg march 2x10 could feel mild pain   Cable walk posterior x10 10 lbs to start then moved to 15.   Reviewed HEP. Cone Drill taken out.       8/28 Ex bike 5 min   SLR 10,7, 8 RPE of 5-7   Bridge 14, 14, 14 red band RPE 6   Cone drill 2x7 with knee locked RPE up towards 8 so exercise stopped   Step onto air-ex x10   Manual: Mcconell patella stabilization      PATIENT EDUCATION:  Education details: HEP, symptom management  Person educated: Patient Education method: Explanation, Demonstration, Tactile cues, Verbal cues, and Handouts Education comprehension: verbalized understanding, returned demonstration, verbal cues required, tactile cues required, and needs further education  HOME EXERCISE PROGRAM:   Eval: Access Code: [email or web address] URL: [email or web address] Date: 03/03/2023 Prepared by: Lorayne Bender  Exercises - Supine Bridge  - 1 x daily - 7 x weekly - 3 sets - 10 reps - Supine Active Straight Leg Raise  - 1 x daily - 7 x weekly - 3 sets - 8 reps - Single Leg Stance with Diagonal Forward Reach  - 1 x daily - 7 x weekly - 3 sets - 10 reps - Side Stepping with Resistance at Ankles  - 1 x daily - 7 x weekly - 3 sets - 10 reps  ASSESSMENT:  CLINICAL IMPRESSION: The patient  is making progress. We have been able to advance her sets and reps. She was encouraged to continue to work at home and continue to work on [employer]. She is up to 30 min on the bie on her own at home. We will continue with our progression. She had some " crunching" with cable walk. She otherwise tolerated treatment very well. We will continue to pgoress as tolerated. She  wshowed improved stability with pratice with instability work.    Eval:Patient is a 52 year old female with left knee pain.  She has increased pain when she crosses her leg.  She is an avid runner and would like to get back to running.  Running causes her increased pain.  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  She has a valgus movement when squatting on the left.  No valgus on the left with squatting.  She would benefit from skilled therapy to improve left strength and stability in order to return to running and regular fitness routine. OBJECTIVE IMPAIRMENTS: decreased mobility, decreased strength, and pain.   ACTIVITY LIMITATIONS: squatting, stairs, and running   PARTICIPATION LIMITATIONS: occupation and yard work  PERSONAL FACTORS: 1-2 comorbidities: anxiety, prior menisectomy   are also affecting patient's functional outcome.   REHAB POTENTIAL: Good  CLINICAL DECISION MAKING: Stable/uncomplicated  EVALUATION COMPLEXITY: Low   GOALS:  Goals reviewed with patient? Yes  SHORT TERM GOALS: Target date: 03/31/2023   Patient will increase gross left lower extremity strength by 5 pounds Baseline: Goal status: progressing 10/1 2.  Patient will demonstrate equal single-leg stance time left versus right Baseline:  Goal status: improved 10/1  3.  Patient will have a be independent with basic HEP Baseline:  Goal status: achieved 10/1  LONG TERM GOALS: Target date: 04/28/2023    Patient will get down in a deep kneeling position without increased pain in order to perform yoga and similar type of activities Baseline:  Goal status: Improving down dog 10/1  2.  Patient will return to running without pain Baseline:  Goal status: has not started 10/1  3.  Patient will return to Pilates exercises without increased pain Baseline:  Goal status:  INITIAL   PLAN:  PT FREQUENCY: 1-2x/week  PT DURATION: 8 weeks  PLANNED INTERVENTIONS: Therapeutic exercises, Therapeutic activity, Neuromuscular re-education, Balance training, Gait training, Patient/Family education, Self Care, Joint mobilization, Stair training, DME instructions, Aquatic Therapy, Dry Needling, Electrical stimulation, Cryotherapy, Moist heat, Taping, Manual therapy, and Re-evaluation.   PLAN FOR NEXT SESSION:  Assess squat next visit. See if it has improved; progress current exercises as tolerated. Consider side lying series of exercises of the hips. Progress cone drill if able. Consider cable walk; consider 2 inch step down. She had this on her previous HEP but likley was too high. She also had leg press. Consider reviewing how to [facility] that effective.   Referring diagnosis?  M25.562,G89.29 (ICD-10-CM) - Chronic pain of left knee  M22.2X2 (ICD-10-CM) - Patellofemoral pain syndrome of left knee   Treatment diagnosis? (if different than referring diagnosis)  What was this (referring dx) caused by? [x]  Surgery []  Fall []  Ongoing issue []  Arthritis []  Other: ____________  Laterality: []  Rt []  Lt []  Both  Check all possible CPT codes:  *CHOOSE 10 OR LESS*    []  97110 (Therapeutic Exercise)  []  92507 (SLP Treatment)  []  97112 (Neuro Re-ed)   []  92526 (Swallowing Treatment)   []  97116 (Gait Training)   []  [id number] (Cognitive Training, 1st 15 minutes) []  97140 (Manual Therapy)   []  97130 (Cognitive Training, each add'l 15 minutes)  []  97164 (Re-evaluation)                              []  Other, List CPT Code ____________  []  97530 (Therapeutic Activities)     []  97535 (Self Care)   [x]  All codes above (97110 - 97535)  []  97012 (Mechanical Traction)  []  97014 (E-stim Unattended)  []  97032 (E-stim manual)  []  97033 (Ionto)  []  97035 (Ultrasound) []  97750 (Physical Performance Training) []  [id number] (Aquatic Therapy) []  97016 (Vasopneumatic Device) []  [id number]  (Paraffin) []  97034 (Contrast Bath) []  16109 (Wound Care 1st 20 sq cm) []  97598 (Wound Care each add'l 20 sq cm) []  97760 (Orthotic Fabrication, Fitting, Training Initial) []  [id number] (Prosthetic Management and Training Initial) []  [id number] (Orthotic or Prosthetic Training/ Modification Subsequent)   Dessie Coma, PT 05/01/2023, 9:57 AM

## 2023-05-08 ENCOUNTER — Ambulatory Visit (INDEPENDENT_AMBULATORY_CARE_PROVIDER_SITE_OTHER): Payer: Medicare PPO | Admitting: *Deleted

## 2023-05-08 DIAGNOSIS — Z3042 Encounter for surveillance of injectable contraceptive: Secondary | ICD-10-CM | POA: Diagnosis not present

## 2023-05-08 DIAGNOSIS — N951 Menopausal and female climacteric states: Secondary | ICD-10-CM

## 2023-05-08 NOTE — Progress Notes (Signed)
Date last pap: 10/27/21. Last Depo-Provera: 02/13/23. Side Effects if any: N/A. Serum HCG indicated? N/A. Depo-Provera 150 mg IM given by: S.Malen Gauze, CMA. Next appointment due 07/24/23-08/07/23.  FSH drawn today per Joni Reining last note - may need 2 lab draws to determine menopausal status.   Administrations This Visit     medroxyPROGESTERone (DEPO-PROVERA) injection 150 mg     Admin Date 05/08/2023 Action Given Dose 150 mg Route Intramuscular Documented By Lanney Gins, CMA

## 2023-05-09 ENCOUNTER — Encounter (HOSPITAL_BASED_OUTPATIENT_CLINIC_OR_DEPARTMENT_OTHER): Payer: Medicare PPO | Admitting: Physical Therapy

## 2023-05-09 LAB — FOLLICLE STIMULATING HORMONE: FSH: 7.4 m[IU]/mL

## 2023-05-14 ENCOUNTER — Encounter (HOSPITAL_BASED_OUTPATIENT_CLINIC_OR_DEPARTMENT_OTHER): Payer: Self-pay | Admitting: Physical Therapy

## 2023-05-14 ENCOUNTER — Ambulatory Visit (HOSPITAL_BASED_OUTPATIENT_CLINIC_OR_DEPARTMENT_OTHER): Payer: Medicare PPO | Admitting: Physical Therapy

## 2023-05-14 DIAGNOSIS — R2689 Other abnormalities of gait and mobility: Secondary | ICD-10-CM

## 2023-05-14 DIAGNOSIS — G8929 Other chronic pain: Secondary | ICD-10-CM

## 2023-05-14 DIAGNOSIS — M25562 Pain in left knee: Secondary | ICD-10-CM | POA: Diagnosis not present

## 2023-05-14 NOTE — Therapy (Signed)
OUTPATIENT PHYSICAL THERAPY LOWER EXTREMITY EVALUATION   Patient Name: Alicia Logan MRN: [id number] DOB:[birth year], 52 y.o., female Today's Date: 05/14/2023  END OF SESSION:  PT End of Session - 05/14/23 1322     Visit Number 8    Number of Visits 12    Date for PT Re-Evaluation 06/05/23    PT Start Time 1315    PT Stop Time 1358    PT Time Calculation (min) 43 min    Activity Tolerance Patient tolerated treatment well    Behavior During Therapy [facility] for tasks assessed/performed                 Past Medical History:  Diagnosis Date   Anxiety    Arthritis    Bipolar 1 disorder (HCC)    Chronic back pain    Chronic diarrhea 08/2020   Depression    Headache    Substance abuse [facility])    Past Surgical History:  Procedure Laterality Date   ARTHROSCOPIC REPAIR PCL     DENTAL SURGERY     KNEE ARTHROSCOPY WITH LATERAL MENISECTOMY Left 04/20/2022   Procedure: KNEE ARTHROSCOPY WITH PARTIAL  lateral MENISECTOMY;  Surgeon: Yolonda Kida, MD;  Location: [facility];  Service: Orthopedics;  Laterality: Left;   NASAL SEPTUM SURGERY  07/2017   SPINAL CORD STIMULATOR INSERTION N/A 05/18/2016   Procedure: LUMBAR SPINAL CORD STIMULATOR INSERTION;  Surgeon: Venita Lick, MD;  Location: [facility] OR;  Service: Orthopedics;  Laterality: N/A;   Patient Active Problem List   Diagnosis Date Noted   Bipolar disorder, curr episode depressed, severe, w/psychotic features (HCC) 06/30/2016   Moderate benzodiazepine use disorder (HCC) 06/30/2016   Opioid use disorder, moderate, dependence (HCC) 06/30/2016   Stimulant use disorder 06/30/2016   Chronic pain syndrome 06/30/2016   History of back surgery 06/30/2016   S/P insertion of spinal cord stimulator 06/30/2016   Polysubstance dependence (HCC) 06/29/2016   Major depressive disorder, recurrent, moderate (HCC) 06/29/2016   Chronic pain 05/18/2016   Viral illness 11/26/2011   Depression 11/26/2011   Chronic lower back pain 11/26/2011     PCP: No PCP   REFERRING PROVIDER: Dr Huel Cote  REFERRING DIAG:  Diagnosis  M25.562,G89.29 (ICD-10-CM) - Chronic pain of left knee  M22.2X2 (ICD-10-CM) - Patellofemoral pain syndrome of left knee    THERAPY DIAG:  Chronic pain of left knee  Other abnormalities of gait and mobility  Rationale for Evaluation and Treatment: Rehabilitation  ONSET DATE:   SUBJECTIVE:   SUBJECTIVE STATEMENT: The patient has not been to therapy for 2 weeks. She reports she did a tougher class today. Overall she has had some pain in her knee but she has been able to do more activity.    Patient had a knee arthroscopy and meniscal debridement on 04/21/2023.  She was told that she can go back to full activity after 2 weeks.  She gradually increase her activity but over that timeframe her pain began to increase.  She went to physical therapy for 6 weeks with no significant improvement in pain.  She returned to the doctor told her just not to run or jump anymore.  She is an avid runner and would like to continue running.  She went for a second opinion.  She was sent over here for physical therapy.  PERTINENT HISTORY: Anxiety, depression, bipolar 1 disorder, substance abuse, headaches spinal cord stimulator insertion 2017, arthroscopic repair of PCL unspecified date and knee. PAIN:  Are you having pain? Yes:  NPRS scale: 5/10 Pain location: Medial knee Pain description: Aching Aggravating factors: Crossing legs and deep bending Relieving factors: Not getting in these positions  PRECAUTIONS: Spinal stimulator   RED FLAGS: None    WEIGHT BEARING RESTRICTIONS: No  FALLS:  Has patient fallen in last 6 months? Yes. Number of falls Legs buckles but has not fallen the floor  LIVING ENVIRONMENT: Does not have steps at her house. Has to go up to visit her neighbor  OCCUPATION:   Dentist  Hobbies:  Running, exercises      PLOF: Independent  PATIENT GOALS:  To have less pain    NEXT MD VISIT:    OBJECTIVE:   DIAGNOSTIC FINDINGS:  Nothing post op   PATIENT SURVEYS:  FOTO    COGNITION: Overall cognitive status: Within functional limits for tasks assessed     SENSATION: WFL  EDEMA:  No noticeable edema MUSCLE LENGTH:  POSTURE: No Significant postural limitations  PALPATION: No significant tenderness to palpation   LOWER EXTREMITY ROM:  Passive ROM Right eval Left eval  Hip flexion    Hip extension    Hip abduction    Hip adduction    Hip internal rotation    Hip external rotation    Knee flexion  Pain with loaded full flexion   Knee extension    Ankle dorsiflexion    Ankle plantarflexion    Ankle inversion    Ankle eversion     (Blank rows = not tested)  LOWER EXTREMITY MMT:  MMT Right eval Left eval Right  Left   Hip flexion 31.4 24.6 31.8 27.6  Hip extension      Hip abduction 26.5 26.6 33.6 34.5  Hip adduction      Hip internal rotation      Hip external rotation      Knee flexion      Knee extension 42.2 34.7 52.3 39.7  Ankle dorsiflexion      Ankle plantarflexion      Ankle inversion      Ankle eversion       (Blank rows = not tested)    FUNCTIONAL TESTS:  Squat: considerable knee valgus on the left   Single leg stance minor instability with knee unlcoked on the left   GAIT: No significant gait deviations noted.  With patient's stability starts to improve we will do a running analysis  TODAY'S TREATMENT:                                                                                                                              DATE:   10/22 SLR X15 no weight x10 1.5 but had pain x10 no weight  Bridge 3x15 red  Step onto air-ex fwd and lateral 2x10   Leg press 2x10 50 lbs  At this point rpeorted pain so exercise stopped   Bike: 5 min L1      10/8  SLR no weight X17 2x10  1 lbs   Bridge 3x17 blue   Leg press   50 lbs 2x10  Step onto air-ex x15   Cable walk 15 lbs x5    10/1   SLR 1  lbs 10, 8, 7    Bridge 15, 12 10 green band   Leg press  40 lbs x10 50 lbs 2x10   Reviewed goals and tests and measures.    09/25 SLR 15, 15, 15   Step onto air-ex forward and lateral 2x10   Manual: garde II and III PA and AP glides for pain and inflamation;   Heel raises 2x15     9/11 SLR 15, 12, 10  Bridge 14 14, 14 with  green band   Cable walk posterior x10 10 lbs to start then moved to 15.   Reviewed HEP  Step onto air-ex forward and lateral 2x10   Mild pain going lateral    9/5 SLR 15, 10, 10   Bridge 14 14, 14 with red band   Single leg march 2x10 could feel mild pain   Cable walk posterior x10 10 lbs to start then moved to 15.   Reviewed HEP. Cone Drill taken out.       8/28 Ex bike 5 min   SLR 10,7, 8 RPE of 5-7   Bridge 14, 14, 14 red band RPE 6   Cone drill 2x7 with knee locked RPE up towards 8 so exercise stopped   Step onto air-ex x10   Manual: Mcconell patella stabilization      PATIENT EDUCATION:  Education details: HEP, symptom management  Person educated: Patient Education method: Explanation, Demonstration, Tactile cues, Verbal cues, and Handouts Education comprehension: verbalized understanding, returned demonstration, verbal cues required, tactile cues required, and needs further education  HOME EXERCISE PROGRAM:   Eval: Access Code: [email or web address] URL: [email or web address] Date: 03/03/2023 Prepared by: Lorayne Bender  Exercises - Supine Bridge  - 1 x daily - 7 x weekly - 3 sets - 10 reps - Supine Active Straight Leg Raise  - 1 x daily - 7 x weekly - 3 sets - 8 reps - Single Leg Stance with Diagonal Forward Reach  - 1 x daily - 7 x weekly - 3 sets - 10 reps - Side Stepping with Resistance at Ankles  - 1 x daily - 7 x weekly - 3 sets - 10 reps  ASSESSMENT:  CLINICAL IMPRESSION: The patient was limited today. She reported she did a hard workout prior to therapy. She was advised that if she can continue  to do harder piliates type workouts and she isn't having much pain, she may be close to discharge. We tried to advance her weights but she had more pain. Therapy will continue to progress as tolerated.    Eval:Patient is a 52 year old female with left knee pain.  She has increased pain when she crosses her leg.  She is an avid runner and would like to get back to running.  Running causes her increased pain.  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  She has a valgus movement when squatting on the left.  No valgus on the left with squatting.  She would benefit from skilled therapy to improve left strength and stability in order to return to running and regular fitness routine. OBJECTIVE IMPAIRMENTS: decreased mobility, decreased strength, and pain.   ACTIVITY LIMITATIONS: squatting, stairs,  and running   PARTICIPATION LIMITATIONS: occupation and yard work  PERSONAL FACTORS: 1-2 comorbidities: anxiety, prior menisectomy   are also affecting patient's functional outcome.   REHAB POTENTIAL: Good  CLINICAL DECISION MAKING: Stable/uncomplicated  EVALUATION COMPLEXITY: Low   GOALS: Goals reviewed with patient? Yes  SHORT TERM GOALS: Target date: 03/31/2023   Patient will increase gross left lower extremity strength by 5 pounds Baseline: Goal status: progressing 10/1 2.  Patient will demonstrate equal single-leg stance time left versus right Baseline:  Goal status: improved 10/1  3.  Patient will have a be independent with basic HEP Baseline:  Goal status: achieved 10/1  LONG TERM GOALS: Target date: 04/28/2023    Patient will get down in a deep kneeling position without increased pain in order to perform yoga and similar type of activities Baseline:  Goal status: Improving down dog 10/1  2.  Patient will return to running without pain Baseline:  Goal  status: has not been able to return yet  10/22  3.  Patient will return to Pilates exercises without increased pain Baseline:  Goal status: INITIAL   PLAN:  PT FREQUENCY: 1-2x/week  PT DURATION: 8 weeks  PLANNED INTERVENTIONS: Therapeutic exercises, Therapeutic activity, Neuromuscular re-education, Balance training, Gait training, Patient/Family education, Self Care, Joint mobilization, Stair training, DME instructions, Aquatic Therapy, Dry Needling, Electrical stimulation, Cryotherapy, Moist heat, Taping, Manual therapy, and Re-evaluation.   PLAN FOR NEXT SESSION:  Assess squat next visit. See if it has improved; progress current exercises as tolerated. Consider side lying series of exercises of the hips. Progress cone drill if able. Consider cable walk; consider 2 inch step down. She had this on her previous HEP but likley was too high. She also had leg press. Consider reviewing how to [facility] that effective.   Referring diagnosis?  M25.562,G89.29 (ICD-10-CM) - Chronic pain of left knee  M22.2X2 (ICD-10-CM) - Patellofemoral pain syndrome of left knee   Treatment diagnosis? (if different than referring diagnosis)  What was this (referring dx) caused by? [x]  Surgery []  Fall []  Ongoing issue []  Arthritis []  Other: ____________  Laterality: []  Rt []  Lt []  Both  Check all possible CPT codes:  *CHOOSE 10 OR LESS*    []  97110 (Therapeutic Exercise)  []  92507 (SLP Treatment)  []  44010 (Neuro Re-ed)   []  92526 (Swallowing Treatment)   []  97116 (Gait Training)   []  [id number] (Cognitive Training, 1st 15 minutes) []  97140 (Manual Therapy)   []  97130 (Cognitive Training, each add'l 15 minutes)  []  97164 (Re-evaluation)                              []  Other, List CPT Code ____________  []  97530 (Therapeutic Activities)     []  97535 (Self Care)   [x]  All codes above (97110 - 97535)  []  97012 (Mechanical Traction)  []  97014 (E-stim Unattended)  []  97032 (E-stim manual)  []  97033 (Ionto)  []   97035 (Ultrasound) []  97750 (Physical Performance Training) []  [id number] (Aquatic Therapy) []  97016 (Vasopneumatic Device) []  [id number] (Paraffin) []  97034 (Contrast Bath) []  97597 (Wound Care 1st 20 sq cm) []  97598 (Wound Care each add'l 20 sq cm) []  97760 (Orthotic Fabrication, Fitting, Training Initial) []  [id number] (Prosthetic Management and Training Initial) []  [id number] (Orthotic or Prosthetic Training/ Modification Subsequent)   Alicia Logan, PT 05/14/2023, 1:27 PM

## 2023-05-15 ENCOUNTER — Encounter (HOSPITAL_BASED_OUTPATIENT_CLINIC_OR_DEPARTMENT_OTHER): Payer: Self-pay | Admitting: Physical Therapy

## 2023-05-21 ENCOUNTER — Ambulatory Visit (HOSPITAL_BASED_OUTPATIENT_CLINIC_OR_DEPARTMENT_OTHER): Payer: Medicare PPO | Admitting: Physical Therapy

## 2023-05-29 ENCOUNTER — Ambulatory Visit (HOSPITAL_BASED_OUTPATIENT_CLINIC_OR_DEPARTMENT_OTHER): Payer: Medicare PPO | Attending: Orthopaedic Surgery | Admitting: Physical Therapy

## 2023-05-29 ENCOUNTER — Other Ambulatory Visit: Payer: Self-pay | Admitting: Student

## 2023-05-29 ENCOUNTER — Encounter (HOSPITAL_BASED_OUTPATIENT_CLINIC_OR_DEPARTMENT_OTHER): Payer: Self-pay | Admitting: Physical Therapy

## 2023-05-29 DIAGNOSIS — N951 Menopausal and female climacteric states: Secondary | ICD-10-CM

## 2023-05-29 DIAGNOSIS — M25562 Pain in left knee: Secondary | ICD-10-CM | POA: Insufficient documentation

## 2023-05-29 DIAGNOSIS — R2689 Other abnormalities of gait and mobility: Secondary | ICD-10-CM | POA: Insufficient documentation

## 2023-05-29 DIAGNOSIS — G8929 Other chronic pain: Secondary | ICD-10-CM | POA: Insufficient documentation

## 2023-05-29 DIAGNOSIS — Z3042 Encounter for surveillance of injectable contraceptive: Secondary | ICD-10-CM

## 2023-05-30 NOTE — Therapy (Signed)
OUTPATIENT PHYSICAL THERAPY LOWER EXTREMITY EVALUATION   Patient Name: Alicia Logan MRN: [id number] DOB:02/10/1971, 52 y.o., female Today's Date: 05/30/2023  END OF SESSION:  PT End of Session - 05/29/23 1613     Visit Number 9    Number of Visits 12    Date for PT Re-Evaluation 06/05/23    PT Start Time 1300    PT Stop Time 1316   requested to be discharged/put on hold   PT Time Calculation (min) 16 min    Activity Tolerance Patient tolerated treatment well    Behavior During Therapy [facility] for tasks assessed/performed                 Past Medical History:  Diagnosis Date   Anxiety    Arthritis    Bipolar 1 disorder (HCC)    Chronic back pain    Chronic diarrhea 08/2020   Depression    Headache    Substance abuse [facility])    Past Surgical History:  Procedure Laterality Date   ARTHROSCOPIC REPAIR PCL     DENTAL SURGERY     KNEE ARTHROSCOPY WITH LATERAL MENISECTOMY Left 04/20/2022   Procedure: KNEE ARTHROSCOPY WITH PARTIAL  lateral MENISECTOMY;  Surgeon: Yolonda Kida, MD;  Location: [facility];  Service: Orthopedics;  Laterality: Left;   NASAL SEPTUM SURGERY  07/2017   SPINAL CORD STIMULATOR INSERTION N/A 05/18/2016   Procedure: LUMBAR SPINAL CORD STIMULATOR INSERTION;  Surgeon: Venita Lick, MD;  Location: [facility];  Service: Orthopedics;  Laterality: N/A;   Patient Active Problem List   Diagnosis Date Noted   Bipolar disorder, curr episode depressed, severe, w/psychotic features (HCC) 06/30/2016   Moderate benzodiazepine use disorder (HCC) 06/30/2016   Opioid use disorder, moderate, dependence (HCC) 06/30/2016   Stimulant use disorder 06/30/2016   Chronic pain syndrome 06/30/2016   History of back surgery 06/30/2016   S/P insertion of spinal cord stimulator 06/30/2016   Polysubstance dependence (HCC) 06/29/2016   Major depressive disorder, recurrent, moderate (HCC) 06/29/2016   Chronic pain 05/18/2016   Viral illness 11/26/2011   Depression 11/26/2011    Chronic lower back pain 11/26/2011    PCP: No PCP   REFERRING PROVIDER: Dr Huel Cote  REFERRING DIAG:  Diagnosis  M25.562,G89.29 (ICD-10-CM) - Chronic pain of left knee  M22.2X2 (ICD-10-CM) - Patellofemoral pain syndrome of left knee    THERAPY DIAG:  Chronic pain of left knee  Other abnormalities of gait and mobility  Rationale for Evaluation and Treatment: Rehabilitation  ONSET DATE:   SUBJECTIVE:   SUBJECTIVE STATEMENT: The patient has not been to therapy for 2 weeks. She reports she did a tougher class today. Overall she has had some pain in her knee but she has been able to do more activity.    Patient had a knee arthroscopy and meniscal debridement on 04/21/2023.  She was told that she can go back to full activity after 2 weeks.  She gradually increase her activity but over that timeframe her pain began to increase.  She went to physical therapy for 6 weeks with no significant improvement in pain.  She returned to the doctor told her just not to run or jump anymore.  She is an avid runner and would like to continue running.  She went for a second opinion.  She was sent over here for physical therapy.  PERTINENT HISTORY: Anxiety, depression, bipolar 1 disorder, substance abuse, headaches spinal cord stimulator insertion 2017, arthroscopic repair of PCL unspecified date and knee.  PAIN:  Are you having pain? Yes: NPRS scale: 5/10 Pain location: Medial knee Pain description: Aching Aggravating factors: Crossing legs and deep bending Relieving factors: Not getting in these positions  PRECAUTIONS: Spinal stimulator   RED FLAGS: None    WEIGHT BEARING RESTRICTIONS: No  FALLS:  Has patient fallen in last 6 months? Yes. Number of falls Legs buckles but has not fallen the floor  LIVING ENVIRONMENT: Does not have steps at her house. Has to go up to visit her neighbor  OCCUPATION:   Dentist  Hobbies:  Running, exercises      PLOF:  Independent  PATIENT GOALS:  To have less pain   NEXT MD VISIT:    OBJECTIVE:   DIAGNOSTIC FINDINGS:  Nothing post op   PATIENT SURVEYS:  FOTO    COGNITION: Overall cognitive status: Within functional limits for tasks assessed     SENSATION: WFL  EDEMA:  No noticeable edema MUSCLE LENGTH:  POSTURE: No Significant postural limitations  PALPATION: No significant tenderness to palpation   LOWER EXTREMITY ROM:  Passive ROM Right eval Left eval  Hip flexion    Hip extension    Hip abduction    Hip adduction    Hip internal rotation    Hip external rotation    Knee flexion  Pain with loaded full flexion   Knee extension    Ankle dorsiflexion    Ankle plantarflexion    Ankle inversion    Ankle eversion     (Blank rows = not tested)  LOWER EXTREMITY MMT:  MMT Right eval Left eval Right  Left   Hip flexion 31.4 24.6 31.8 27.6  Hip extension      Hip abduction 26.5 26.6 33.6 34.5  Hip adduction      Hip internal rotation      Hip external rotation      Knee flexion      Knee extension 42.2 34.7 52.3 39.7  Ankle dorsiflexion      Ankle plantarflexion      Ankle inversion      Ankle eversion       (Blank rows = not tested)    FUNCTIONAL TESTS:  Squat: considerable knee valgus on the left   Single leg stance minor instability with knee unlcoked on the left   GAIT: No significant gait deviations noted.  With patient's stability starts to improve we will do a running analysis  TODAY'S TREATMENT:                                                                                                                              DATE:   10/22 SLR X15 no weight x10 1.5 but had pain x10 no weight  Bridge 3x15 red  Step onto air-ex fwd and lateral 2x10   Leg press 2x10 50 lbs  At this point rpeorted pain so exercise stopped   Bike: 5 min L1  10/8  SLR no weight X17 2x10 1 lbs   Bridge 3x17 blue   Leg press   50 lbs 2x10  Step onto air-ex  x15   Cable walk 15 lbs x5    10/1   SLR 1 lbs 10, 8, 7    Bridge 15, 12 10 green band   Leg press  40 lbs x10 50 lbs 2x10   Reviewed goals and tests and measures.    09/25 SLR 15, 15, 15   Step onto air-ex forward and lateral 2x10   Manual: garde II and III PA and AP glides for pain and inflamation;   Heel raises 2x15     9/11 SLR 15, 12, 10  Bridge 14 14, 14 with  green band   Cable walk posterior x10 10 lbs to start then moved to 15.   Reviewed HEP  Step onto air-ex forward and lateral 2x10   Mild pain going lateral    9/5 SLR 15, 10, 10   Bridge 14 14, 14 with red band   Single leg march 2x10 could feel mild pain   Cable walk posterior x10 10 lbs to start then moved to 15.   Reviewed HEP. Cone Drill taken out.       8/28 Ex bike 5 min   SLR 10,7, 8 RPE of 5-7   Bridge 14, 14, 14 red band RPE 6   Cone drill 2x7 with knee locked RPE up towards 8 so exercise stopped   Step onto air-ex x10   Manual: Mcconell patella stabilization      PATIENT EDUCATION:  Education details: HEP, symptom management  Person educated: Patient Education method: Explanation, Demonstration, Tactile cues, Verbal cues, and Handouts Education comprehension: verbalized understanding, returned demonstration, verbal cues required, tactile cues required, and needs further education  HOME EXERCISE PROGRAM:   Eval: Access Code: [email or web address] URL: [email or web address] Date: 03/03/2023 Prepared by: Lorayne Bender  Exercises - Supine Bridge  - 1 x daily - 7 x weekly - 3 sets - 10 reps - Supine Active Straight Leg Raise  - 1 x daily - 7 x weekly - 3 sets - 8 reps - Single Leg Stance with Diagonal Forward Reach  - 1 x daily - 7 x weekly - 3 sets - 10 reps - Side Stepping with Resistance at Ankles  - 1 x daily - 7 x weekly - 3 sets - 10 reps  ASSESSMENT:  CLINICAL IMPRESSION: The patient comes in today reporting that she has been doing very well.   She has been walking on her own.  She spent 4 days at the beach and did a large amount of walking.  She had no significant pain or swelling.  She has been doing her Pilates without significant pain.  She is still avoiding deep bending activities.  She feels like she is ready for discharge.  Therapy discussed with her how to use her program.  We talked her about progression of the exercise and activities.  At this point she was advised that if she is doing well to continue with her bodies and not worry about her PT exercises.  She was advised that she has an exacerbation of pain to return to her PT exercises.  She is advised to start with low weights and build herself up the same way we did not physical therapy.  She does not feel like at this time she needs to return to running.  We advised her if she does start to  feel strong and stable enough to return to running started on track running the straightaway's the corners.  We will keep her open at this time for 4 to 6 weeks.  She is advised if she has any issues.  See below for goal specific progress  Eval:Patient is a 52 year old female with left knee pain.  She has increased pain when she crosses her leg.  She is an avid runner and would like to get back to running.  Running causes her increased pain.  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  She has a valgus movement when squatting on the left.  No valgus on the left with squatting.  She would benefit from skilled therapy to improve left strength and stability in order to return to running and regular fitness routine. OBJECTIVE IMPAIRMENTS: decreased mobility, decreased strength, and pain.   ACTIVITY LIMITATIONS: squatting, stairs, and running   PARTICIPATION LIMITATIONS: occupation and yard work  PERSONAL FACTORS: 1-2 comorbidities: anxiety, prior menisectomy   are also  affecting patient's functional outcome.   REHAB POTENTIAL: Good  CLINICAL DECISION MAKING: Stable/uncomplicated  EVALUATION COMPLEXITY: Low   GOALS: Goals reviewed with patient? Yes  SHORT TERM GOALS: Target date: 03/31/2023   Patient will increase gross left lower extremity strength by 5 pounds Baseline: Goal status: 11/6 2.  Patient will demonstrate equal single-leg stance time left versus right Baseline:  Goal status: i improved but still mild limitations 11/6  3.  Patient will have a be independent with basic HEP Baseline:  Goal status: achieved has base HEP 11/6  LONG TERM GOALS: Target date: 04/28/2023    Patient will get down in a deep kneeling position without increased pain in order to perform yoga and similar type of activities Baseline:  Goal status: Unable to get in a deep kneeling position at this time not feel like she needs to  2.  Patient will return to running without pain Baseline:  Goal status: Does not feel like she needs to return to running at this time  3.  Patient will return to Pilates exercises without increased pain Baseline:  Goal status: Has returned to Pilates 11/6   PLAN:  PT FREQUENCY: 1-2x/week  PT DURATION: 8 weeks  PLANNED INTERVENTIONS: Therapeutic exercises, Therapeutic activity, Neuromuscular re-education, Balance training, Gait training, Patient/Family education, Self Care, Joint mobilization, Stair training, DME instructions, Aquatic Therapy, Dry Needling, Electrical stimulation, Cryotherapy, Moist heat, Taping, Manual therapy, and Re-evaluation.   PLAN FOR NEXT SESSION:  Assess squat next visit. See if it has improved; progress current exercises as tolerated. Consider side lying series of exercises of the hips. Progress cone drill if able. Consider cable walk; consider 2 inch step down. She had this on her previous HEP but likley was too high. She also had leg press. Consider reviewing how to [facility] that effective.   Referring  diagnosis?  M25.562,G89.29 (ICD-10-CM) - Chronic pain of left knee  M22.2X2 (ICD-10-CM) - Patellofemoral pain syndrome of left knee   Treatment diagnosis? (if different than referring diagnosis)  What was this (referring dx) caused by? [x]  Surgery []  Fall []  Ongoing issue []  Arthritis []  Other: ____________  Laterality: []  Rt []  Lt []  Both  Check all possible CPT codes:  *CHOOSE 10 OR LESS*    []  97110 (Therapeutic Exercise)  []  92507 (SLP Treatment)  []  97112 (Neuro Re-ed)   []  92526 (Swallowing  Treatment)   []  [id number] (Gait Training)   []  [id number] (Cognitive Training, 1st 15 minutes) []  97140 (Manual Therapy)   []  97130 (Cognitive Training, each add'l 15 minutes)  []  97164 (Re-evaluation)                              []  Other, List CPT Code ____________  []  97530 (Therapeutic Activities)     []  97535 (Self Care)   [x]  All codes above (97110 - 97535)  []  97012 (Mechanical Traction)  []  97014 (E-stim Unattended)  []  97032 (E-stim manual)  []  97033 (Ionto)  []  97035 (Ultrasound) []  97750 (Physical Performance Training) []  [id number] (Aquatic Therapy) []  97016 (Vasopneumatic Device) []  [id number] (Paraffin) []  97034 (Contrast Bath) []  97597 (Wound Care 1st 20 sq cm) []  97598 (Wound Care each add'l 20 sq cm) []  97760 (Orthotic Fabrication, Fitting, Training Initial) []  46270 (Prosthetic Management and Training Initial) []  [id number] (Orthotic or Prosthetic Training/ Modification Subsequent)   Dessie Coma, PT 05/30/2023, 10:58 AM

## 2023-06-05 ENCOUNTER — Encounter (HOSPITAL_BASED_OUTPATIENT_CLINIC_OR_DEPARTMENT_OTHER): Payer: Medicare PPO | Admitting: Physical Therapy

## 2023-07-30 ENCOUNTER — Ambulatory Visit: Payer: Medicare PPO | Admitting: *Deleted

## 2023-07-30 VITALS — BP 114/77 | HR 96 | Wt 154.5 lb

## 2023-07-30 DIAGNOSIS — Z3042 Encounter for surveillance of injectable contraceptive: Secondary | ICD-10-CM | POA: Diagnosis not present

## 2023-07-30 MED ORDER — MEDROXYPROGESTERONE ACETATE 150 MG/ML IM SUSP
150.0000 mg | Freq: Once | INTRAMUSCULAR | Status: AC
  Administered 2023-07-30: 150 mg via INTRAMUSCULAR

## 2023-07-30 NOTE — Progress Notes (Signed)
 Date last pap: (annual)01/18/23. Last Depo-Provera: 05/08/23. Side Effects if any: NA. Serum HCG indicated? NA. Depo-Provera 150 mg IM given by: Karma Ganja, RN in RUO glut. Next appointment due 10/15/23-10/29/23.

## 2023-08-20 ENCOUNTER — Ambulatory Visit: Payer: Medicare PPO | Admitting: Urology

## 2023-10-08 ENCOUNTER — Other Ambulatory Visit: Payer: Self-pay | Admitting: Obstetrics & Gynecology

## 2023-10-08 ENCOUNTER — Ambulatory Visit (INDEPENDENT_AMBULATORY_CARE_PROVIDER_SITE_OTHER): Payer: Medicare PPO | Admitting: Urology

## 2023-10-08 VITALS — BP 110/71 | HR 67

## 2023-10-08 DIAGNOSIS — Z3042 Encounter for surveillance of injectable contraceptive: Secondary | ICD-10-CM

## 2023-10-08 DIAGNOSIS — N3941 Urge incontinence: Secondary | ICD-10-CM

## 2023-10-08 DIAGNOSIS — Z01419 Encounter for gynecological examination (general) (routine) without abnormal findings: Secondary | ICD-10-CM

## 2023-10-08 MED ORDER — SOLIFENACIN SUCCINATE 5 MG PO TABS: 5.0000 mg | ORAL_TABLET | Freq: Every day | ORAL | 11 refills | Status: DC

## 2023-10-08 NOTE — Progress Notes (Signed)
 10/08/2023 10:34 AM   Jodi Mourning 1971-01-11 [id number]  Referring provider: No referring provider defined for this encounter.  Chief Complaint  Patient presents with   Follow-up    HPI: I was consulted to assess the patient's urinary incontinence worse in last several weeks.  She has urge incontinence especially when she drives from [place] early in the morning to [facility] to Pilates.  She has to pull over.  She wonders if it is due to coffee which she is reduced.  Her bladder may have been more urgent 6 months ago but she has been going through a lot with anxiety and knee issues.  She is also having a 2-year history of diarrhea which is upsetting   She wears 1 pad a day when she goes out in public and it can be damp.  She does not have stress incontinence with Pilates coughing sneezing or bending.  No bedwetting   Flow is reasonable.  She does not necessarily feel empty.  She double voids a small amount.  She will feel urgency and void a small amount.   She has a spinal cord stimulator for low back pain and has had a back procedure and is not using the stimulator   On pelvic examination patient had mild grade 2 hypermobility bladder neck and a mild grade 1 cystocele asymptomatic. No stress incontinence.    Patient has frequency and urgency and some urge incontinence especially in last few weeks.  The patient may have a bladder infection based upon urinalysis and will call if culture positive.  If this was the diagnosis she could stop all therapy.  The role of medical and behavioral therapy discussed in detail as well as future cystoscopy for safety reasons.  No blood in urine today   The patient is actually leaving work because of the urgency is so severe and going to public restrooms because of a going restroom.  She wanted me to call in Macrodantin 100 mg twice a day for 7 days.  Call if culture differs.  Of course if this works she will take the Myrbetriq 50 mg samples and  prescription.  I will also cancel the cystoscopy in 6 weeks if she has normalized and had a positive UTI   Culture negative. Patient was doing much better on Myrbetriq but efficacy decreased.  Still having a little bit of leaking but is overall doing better. No stress incontinence and repeat pelvic examination Cystoscopy: normal   reassess in 6 weeks on Gemtesa samples and prescription.  Encourage physical therapy.  We can always order urodynamics depending on her progress.   Patient once again responded to the [facility] and was pleased with the samples.  She said the effects are wearing off again.  When she does a long drive she has to wear a pad or she would wet through her close.  Clinically not infected.  Frequency stable     Reassess in 6 weeks on oxybutynin ER 10 mg 30 x 11.  I am going to have her see our physical therapy in [place].  I can always order urodynamics.  She lives in [place] and this is more convenient    Today Dramatically better.  Has cut out caffeine and she thinks has helped.  Saw our physical therapist once and is very pleased.  Much less frequency urgency and leaking.  Very happy.  No infections  Today Patient was doing well on oxybutynin's until 2 weeks ago.  Now has urgency  and frequency can go every 30 minutes.  When she works or goes out she wears a pad.  Clinically not infected.  No pain to suggest interstitial cystitis   PMH: Past Medical History:  Diagnosis Date   Anxiety    Arthritis    Bipolar 1 disorder (HCC)    Chronic back pain    Chronic diarrhea 08/2020   Depression    Headache    Substance abuse [facility])     Surgical History: Past Surgical History:  Procedure Laterality Date   ARTHROSCOPIC REPAIR PCL     DENTAL SURGERY     KNEE ARTHROSCOPY WITH LATERAL MENISECTOMY Left 04/20/2022   Procedure: KNEE ARTHROSCOPY WITH PARTIAL  lateral MENISECTOMY;  Surgeon: Yolonda Kida, MD;  Location: [facility];  Service: Orthopedics;  Laterality:  Left;   NASAL SEPTUM SURGERY  07/2017   SPINAL CORD STIMULATOR INSERTION N/A 05/18/2016   Procedure: LUMBAR SPINAL CORD STIMULATOR INSERTION;  Surgeon: Venita Lick, MD;  Location: [facility];  Service: Orthopedics;  Laterality: N/A;    Home Medications:  Allergies as of 10/08/2023       Reactions   Singulair [montelukast] Other (See Comments)   Syncope when used with her psych meds   Neomycin Rash   Prograf [tacrolimus] Rash   Sulfa Antibiotics Nausea And Vomiting, Rash        Medication List        Accurate as of October 08, 2023 10:34 AM. If you have any questions, ask your nurse or doctor.          acetaminophen 500 MG tablet Commonly known as: TYLENOL Take 1,000 mg by mouth every 6 (six) hours as needed for mild pain or moderate pain.   atomoxetine 40 MG capsule Commonly known as: STRATTERA Take 40 mg by mouth every morning.   buPROPion 150 MG 24 hr tablet Commonly known as: WELLBUTRIN XL Take 150 mg by mouth every morning.   clonazePAM 1 MG tablet Commonly known as: KLONOPIN Take 1 mg by mouth 2 (two) times daily as needed.   diphenoxylate-atropine 2.5-0.025 MG tablet Commonly known as: LOMOTIL TAKE 1 TO 2 TABLETS BY MOUTH FOUR TIMES A DAY AS NEEDED DECREASE AS DIRECTED   EPINEPHrine 0.3 mg/0.3 mL Soaj injection Commonly known as: EPI-PEN Inject 0.3 mg into the muscle as needed for anaphylaxis.   gabapentin 300 MG capsule Commonly known as: NEURONTIN Take 1 capsule (300 mg total) by mouth 3 (three) times daily.   Lurasidone HCl 120 MG Tabs Take 1 tablet by mouth daily.   medroxyPROGESTERone 150 MG/ML injection Commonly known as: DEPO-PROVERA Inject 1 mL (150 mg total) into the muscle every 3 (three) months.   oxybutynin 10 MG 24 hr tablet Commonly known as: DITROPAN-XL Take 1 tablet (10 mg total) by mouth daily.   propranolol 20 MG tablet Commonly known as: INDERAL Take 20 mg by mouth 3 (three) times daily.   QUEtiapine 25 MG tablet Commonly  known as: SEROQUEL Take 25 mg by mouth at bedtime.        Allergies:  Allergies  Allergen Reactions   Singulair [Montelukast] Other (See Comments)    Syncope when used with her psych meds   Neomycin Rash   Prograf [Tacrolimus] Rash   Sulfa Antibiotics Nausea And Vomiting and Rash    Family History: Family History  Problem Relation Age of Onset   Depression Mother    Hypertension Father    Heart failure Father    Stroke Father  Colon cancer Maternal Grandmother    Esophageal cancer Maternal Grandfather     Social History:  reports that she has quit smoking. Her smoking use included cigarettes. She has a 10 pack-year smoking history. She has been exposed to tobacco smoke. She has never used smokeless tobacco. She reports current drug use. Drug: Marijuana. She reports that she does not drink alcohol.  ROS:                                        Physical Exam: There were no vitals taken for this visit.  Constitutional:  Alert and oriented, No acute distress. HEENT:  AT, moist mucus membranes.  Trachea midline, no masses.   Laboratory Data: Lab Results  Component Value Date   WBC 7.2 04/07/2022   HGB 12.2 04/07/2022   HCT 36.9 04/07/2022   MCV 94.4 04/07/2022   PLT 192 04/07/2022    Lab Results  Component Value Date   CREATININE 0.96 04/08/2021    No results found for: "PSA"  No results found for: "TESTOSTERONE"  Lab Results  Component Value Date   HGBA1C 5.2 07/01/2016    Urinalysis    Component Value Date/Time   COLORURINE YELLOW 07/01/2016 1315   APPEARANCEUR Cloudy (A) 04/23/2023 1325   LABSPEC 1.026 07/01/2016 1315   PHURINE 6.0 07/01/2016 1315   GLUCOSEU Negative 04/23/2023 1325   HGBUR NEGATIVE 07/01/2016 1315   BILIRUBINUR Negative 04/23/2023 1325   KETONESUR 20 (A) 07/01/2016 1315   PROTEINUR 1+ (A) 04/23/2023 1325   PROTEINUR NEGATIVE 07/01/2016 1315   UROBILINOGEN 1.0 12/02/2012 1003   NITRITE Negative  04/23/2023 1325   NITRITE NEGATIVE 07/01/2016 1315   LEUKOCYTESUR 1+ (A) 04/23/2023 1325    Pertinent Imaging: Urine reviewed and sent for culture.  Chart reviewed  Assessment & Plan: Patient has refractory urgency frequency affecting her quality life.  Role of urodynamics discussed.  May be a good candidate for third line therapies.  In the meantime try Vesicare 5 mg 30 x 11.  Call if culture positive.  She has no pelvic pain but does have impressive frequency.  We will look for evidence of interstitial cystitis during bladder filling but my index suspicion is low She agreed with the plan and will ask Asher Muir to have her follow-up in [place] since she lives in [place].  We will proceed accordingly  1. Urgency incontinence (Primary)  - Urinalysis, Complete   No follow-ups on file.  Martina Sinner, MD  [facility] [address], [address] [place], [place] [address] [phone]

## 2023-10-09 LAB — URINALYSIS, COMPLETE
Bilirubin, UA: NEGATIVE
Glucose, UA: NEGATIVE
Ketones, UA: NEGATIVE
Leukocytes,UA: NEGATIVE
Nitrite, UA: NEGATIVE
Protein,UA: NEGATIVE
RBC, UA: NEGATIVE
Specific Gravity, UA: 1.01 (ref 1.005–1.030)
Urobilinogen, Ur: 0.2 mg/dL (ref 0.2–1.0)
pH, UA: 7 (ref 5.0–7.5)

## 2023-10-09 LAB — MICROSCOPIC EXAMINATION: Bacteria, UA: NONE SEEN

## 2023-10-15 ENCOUNTER — Telehealth: Payer: Self-pay | Admitting: *Deleted

## 2023-10-15 ENCOUNTER — Ambulatory Visit: Payer: Medicare PPO | Admitting: *Deleted

## 2023-10-15 VITALS — BP 106/71 | HR 85

## 2023-10-15 DIAGNOSIS — Z3042 Encounter for surveillance of injectable contraceptive: Secondary | ICD-10-CM

## 2023-10-15 MED ORDER — MEDROXYPROGESTERONE ACETATE 150 MG/ML IM SUSP
150.0000 mg | Freq: Once | INTRAMUSCULAR | Status: AC
Start: 2023-10-15 — End: 2023-10-15
  Administered 2023-10-15: 150 mg via INTRAMUSCULAR

## 2023-10-15 NOTE — Progress Notes (Signed)
 Date last pap: Annual 01/18/23. Last Depo-Provera: 07/30/23. Side Effects if any: NA. Serum HCG indicated? NA. Depo-Provera 150 mg IM given by: Harrel Lemon, RN in RUO glut. Next appointment due 12/31/23-01/14/24.

## 2023-10-15 NOTE — Telephone Encounter (Signed)
 TC to [facility] [place] to follow up on pt report that she was unable to obtain her depo refill for today's scheduled injection appt. [facility] staff report error on their side. Pt had two active prescriptions and only one was expired. She does have one active prescription that has refills and does not expire until 01/18/24. However, they do not have the med in stock. They will send it to be filled at [facility] on [address]. TC to pt to notify. No answer. Left VM.

## 2023-10-19 ENCOUNTER — Ambulatory Visit: Payer: Medicare PPO

## 2024-01-10 ENCOUNTER — Other Ambulatory Visit: Payer: Medicare PPO

## 2024-01-15 ENCOUNTER — Other Ambulatory Visit: Payer: Self-pay | Admitting: Student

## 2024-01-15 DIAGNOSIS — Z1231 Encounter for screening mammogram for malignant neoplasm of breast: Secondary | ICD-10-CM

## 2024-01-23 ENCOUNTER — Ambulatory Visit (HOSPITAL_BASED_OUTPATIENT_CLINIC_OR_DEPARTMENT_OTHER)
Admission: RE | Admit: 2024-01-23 | Discharge: 2024-01-23 | Disposition: A | Discharge status: Home / Self Care | Source: Ambulatory Visit | Attending: Student | Admitting: Student

## 2024-01-23 DIAGNOSIS — Z3042 Encounter for surveillance of injectable contraceptive: Secondary | ICD-10-CM | POA: Insufficient documentation

## 2024-01-23 DIAGNOSIS — N951 Menopausal and female climacteric states: Secondary | ICD-10-CM | POA: Insufficient documentation

## 2024-01-24 ENCOUNTER — Ambulatory Visit
Admission: RE | Admit: 2024-01-24 | Discharge: 2024-01-24 | Disposition: A | Discharge status: Home / Self Care | Source: Ambulatory Visit | Attending: Student | Admitting: Student

## 2024-01-24 DIAGNOSIS — Z1231 Encounter for screening mammogram for malignant neoplasm of breast: Secondary | ICD-10-CM

## 2024-01-25 ENCOUNTER — Ambulatory Visit: Payer: Self-pay | Admitting: Student

## 2024-02-04 ENCOUNTER — Encounter: Payer: Self-pay | Admitting: Family Medicine

## 2024-02-04 ENCOUNTER — Ambulatory Visit: Payer: Self-pay | Admitting: Family Medicine

## 2024-02-04 ENCOUNTER — Ambulatory Visit (INDEPENDENT_AMBULATORY_CARE_PROVIDER_SITE_OTHER): Admitting: Family Medicine

## 2024-02-04 VITALS — BP 112/84 | HR 80 | Temp 98.1°F | Ht 69.0 in | Wt 154.0 lb

## 2024-02-04 DIAGNOSIS — Z1329 Encounter for screening for other suspected endocrine disorder: Secondary | ICD-10-CM

## 2024-02-04 DIAGNOSIS — N289 Disorder of kidney and ureter, unspecified: Secondary | ICD-10-CM

## 2024-02-04 DIAGNOSIS — Z136 Encounter for screening for cardiovascular disorders: Secondary | ICD-10-CM | POA: Diagnosis not present

## 2024-02-04 DIAGNOSIS — Z0000 Encounter for general adult medical examination without abnormal findings: Secondary | ICD-10-CM

## 2024-02-04 DIAGNOSIS — Z7689 Persons encountering health services in other specified circumstances: Secondary | ICD-10-CM

## 2024-02-04 LAB — COMPREHENSIVE METABOLIC PANEL WITH GFR
ALT: 12 U/L (ref 0–35)
AST: 13 U/L (ref 0–37)
Albumin: 4.7 g/dL (ref 3.5–5.2)
Alkaline Phosphatase: 41 U/L (ref 39–117)
BUN: 11 mg/dL (ref 6–23)
CO2: 28 meq/L (ref 19–32)
Calcium: 9.9 mg/dL (ref 8.4–10.5)
Chloride: 99 meq/L (ref 96–112)
Creatinine, Ser: 1.29 mg/dL — ABNORMAL HIGH (ref 0.40–1.20)
GFR: 48.1 mL/min — ABNORMAL LOW (ref >60.00)
Glucose, Bld: 83 mg/dL (ref 70–99)
Potassium: 4.1 meq/L (ref 3.5–5.1)
Sodium: 135 meq/L (ref 135–145)
Total Bilirubin: 0.6 mg/dL (ref 0.2–1.2)
Total Protein: 7.2 g/dL (ref 6.0–8.3)

## 2024-02-04 LAB — TSH: TSH: 0.84 u[IU]/mL (ref 0.35–5.50)

## 2024-02-04 LAB — LIPID PANEL
Cholesterol: 240 mg/dL — ABNORMAL HIGH (ref 0–200)
HDL: 58 mg/dL (ref >39.00)
LDL Cholesterol: 164 mg/dL — ABNORMAL HIGH (ref 0–99)
NonHDL: 182.39
Total CHOL/HDL Ratio: 4
Triglycerides: 92 mg/dL (ref 0.0–149.0)
VLDL: 18.4 mg/dL (ref 0.0–40.0)

## 2024-02-04 LAB — CBC WITH DIFFERENTIAL/PLATELET
Basophils Absolute: 0 K/uL (ref 0.0–0.1)
Basophils Relative: 0.8 % (ref 0.0–3.0)
Eosinophils Absolute: 0 K/uL (ref 0.0–0.7)
Eosinophils Relative: 0.6 % (ref 0.0–5.0)
HCT: 38.4 % (ref 36.0–46.0)
Hemoglobin: 13 g/dL (ref 12.0–15.0)
Lymphocytes Relative: 38.4 % (ref 12.0–46.0)
Lymphs Abs: 2.1 K/uL (ref 0.7–4.0)
MCHC: 33.8 g/dL (ref 30.0–36.0)
MCV: 92.5 fl (ref 78.0–100.0)
Monocytes Absolute: 0.4 K/uL (ref 0.1–1.0)
Monocytes Relative: 7 % (ref 3.0–12.0)
Neutro Abs: 2.9 K/uL (ref 1.4–7.7)
Neutrophils Relative %: 53.2 % (ref 43.0–77.0)
Platelets: 222 K/uL (ref 150.0–400.0)
RBC: 4.15 Mil/uL (ref 3.87–5.11)
RDW: 13.1 % (ref 11.5–15.5)
WBC: 5.4 K/uL (ref 4.0–10.5)

## 2024-02-04 NOTE — Progress Notes (Signed)
 New Patient Office Visit  Subjective   Patient ID: Alicia Logan, female    DOB: Jan 07, 1971  Age: 53 y.o. MRN: [id number]  CC:  Chief Complaint  Patient presents with   Establish Care    HPI Alicia Logan presents to establish care with new provider.   Patients previous primary care: In [place] about 7 years ago. Patient can not remember previous provider.   Specialist: [facility]  [facility]-Dr. Lamar Salinas [facility] for [facility] at [facility] for Lynann Dauer, NP  [facility]- May Jenkins Batter, NP   No concerns.  Diet: Low fat diet Exercise: Daily, piliates and walking on treadmill  Dental: Regular care  Eye exam: Last week   Outpatient Encounter Medications as of 02/04/2024  Medication Sig   acetaminophen  (TYLENOL ) 500 MG tablet Take 1,000 mg by mouth every 6 (six) hours as needed for mild pain or moderate pain.   atomoxetine (STRATTERA) 40 MG capsule Take 80 mg by mouth every morning.   buPROPion  (WELLBUTRIN  XL) 150 MG 24 hr tablet Take 150 mg by mouth every morning.   clonazePAM (KLONOPIN) 1 MG tablet Take 1 mg by mouth 2 (two) times daily as needed.   EPINEPHrine  0.3 mg/0.3 mL IJ SOAJ injection Inject 0.3 mg into the muscle as needed for anaphylaxis.   gabapentin  (NEURONTIN ) 300 MG capsule Take 1 capsule (300 mg total) by mouth 3 (three) times daily.   Lurasidone HCl 120 MG TABS Take 1 tablet by mouth daily.   medroxyPROGESTERone  (DEPO-PROVERA ) 150 MG/ML injection Inject 1 mL (150 mg total) into the muscle every 3 (three) months.   QUEtiapine (SEROQUEL) 25 MG tablet Take 25 mg by mouth at bedtime.   [DISCONTINUED] diphenoxylate -atropine  (LOMOTIL ) 2.5-0.025 MG tablet TAKE 1 TO 2 TABLETS BY MOUTH FOUR TIMES A DAY AS NEEDED DECREASE AS DIRECTED (Patient not taking: Reported on 02/04/2024)   [DISCONTINUED] oxybutynin  (DITROPAN -XL) 10 MG 24 hr tablet Take 1 tablet (10 mg total) by mouth daily.  (Patient not taking: Reported on 02/04/2024)   [DISCONTINUED] propranolol (INDERAL) 20 MG tablet Take 20 mg by mouth 3 (three) times daily. (Patient not taking: Reported on 02/04/2024)   [DISCONTINUED] solifenacin  (VESICARE ) 5 MG tablet Take 1 tablet (5 mg total) by mouth daily. (Patient not taking: Reported on 02/04/2024)   Facility-Administered Encounter Medications as of 02/04/2024  Medication   medroxyPROGESTERone  (DEPO-PROVERA ) injection 150 mg    Past Medical History:  Diagnosis Date   Anxiety    Arthritis    Bipolar 1 disorder (HCC)    Chronic back pain    Chronic diarrhea 08/2020   Depression    Headache    Substance abuse [facility])     Past Surgical History:  Procedure Laterality Date   ARTHROSCOPIC REPAIR PCL     DENTAL SURGERY     KNEE ARTHROSCOPY WITH LATERAL MENISECTOMY Left 04/20/2022   Procedure: KNEE ARTHROSCOPY WITH PARTIAL  lateral MENISECTOMY;  Surgeon: Sharl Selinda Dover, MD;  Location: [facility];  Service: Orthopedics;  Laterality: Left;   NASAL SEPTUM SURGERY  07/2017   SPINAL CORD STIMULATOR INSERTION N/A 05/18/2016   Procedure: LUMBAR SPINAL CORD STIMULATOR INSERTION;  Surgeon: Donaciano Sprang, MD;  Location: [facility] OR;  Service: Orthopedics;  Laterality: N/A;    Family History  Problem Relation Age of Onset   Depression Mother    Hypertension Father    Heart failure Father    Stroke Father    Colon  cancer Maternal Grandmother    Esophageal cancer Maternal Grandfather     Social History   Socioeconomic History   Marital status: Single    Spouse name: Not on file   Number of children: 0   Years of education: Not on file   Highest education level: Some college, no degree  Occupational History   Occupation: Educational psychologist  Tobacco Use   Smoking status: Former    Current packs/day: 0.50    Average packs/day: 0.5 packs/day for 20.0 years (10.0 ttl pk-yrs)    Types: Cigarettes    Passive exposure: Past   Smokeless tobacco: Never   Tobacco comments:     1/2 pack per day  Vaping Use   Vaping status: Every Day   Substances: Nicotine   Substance and Sexual Activity   Alcohol use: No    Alcohol/week: 5.0 standard drinks of alcohol    Types: 5 Cans of beer per week    Comment: Sober for 7.5 years   Drug use: Yes    Types: Marijuana    Comment: Daily   Sexual activity: Not Currently    Birth control/protection: Injection, Abstinence  Other Topics Concern   Not on file  Social History Narrative   Not on file   Social Drivers of Health   Financial Resource Strain: Medium Risk (05/01/2022)   Received from [facility]   Overall Financial Resource Strain (CARDIA)    Difficulty of Paying Living Expenses: Somewhat hard  Food Insecurity: No Food Insecurity (02/04/2024)   Hunger Vital Sign    Worried About Running Out of Food in the Last Year: Never true    Ran Out of Food in the Last Year: Never true  Transportation Needs: No Transportation Needs (05/01/2022)   Received from [facility] - Transportation    Lack of Transportation (Medical): No    Lack of Transportation (Non-Medical): No  Physical Activity: Sufficiently Active (05/01/2022)   Received from [facility]   Exercise Vital Sign    On average, how many days per week do you engage in moderate to strenuous exercise (like a brisk walk)?: 7 days    On average, how many minutes do you engage in exercise at this level?: 30 min  Stress: Patient Declined (02/04/2024)   [employer] of Occupational Health - Occupational Stress Questionnaire    Feeling of Stress: Patient declined  Social Connections: Moderately Integrated (02/04/2024)   Social Connection and Isolation Panel    Frequency of Communication with Friends and Family: More than three times a week    Frequency of Social Gatherings with Friends and Family: Once a week    Attends Religious Services: More than 4 times per year    Active Member of [employer] or Organizations: No    Attends Banker  Meetings: Never    Marital Status: Married  Catering manager Violence: Not At Risk (02/04/2024)   Humiliation, Afraid, Rape, and Kick questionnaire    Fear of Current or Ex-Partner: No    Emotionally Abused: No    Physically Abused: No    Sexually Abused: No    ROS See HPI above    Objective  BP 112/84   Pulse 80   Temp 98.1 F (36.7 C) (Oral)   Ht 5' 9 (1.753 m)   Wt 154 lb (69.9 kg)   SpO2 98%   BMI 22.74 kg/m   Physical Exam Vitals reviewed.  Constitutional:      General: She is not in  acute distress.    Appearance: Normal appearance. She is normal weight. She is not ill-appearing, toxic-appearing or diaphoretic.  HENT:     Head: Normocephalic and atraumatic.     Right Ear: Tympanic membrane, ear canal and external ear normal. There is no impacted cerumen.     Left Ear: Tympanic membrane, ear canal and external ear normal. There is no impacted cerumen.     Mouth/Throat:     Mouth: Mucous membranes are moist.     Pharynx: Oropharynx is clear. Uvula midline. No pharyngeal swelling, oropharyngeal exudate, posterior oropharyngeal erythema, uvula swelling or postnasal drip.  Eyes:     General:        Right eye: No discharge.        Left eye: No discharge.     Conjunctiva/sclera: Conjunctivae normal.     Pupils: Pupils are equal, round, and reactive to light.  Cardiovascular:     Rate and Rhythm: Normal rate and regular rhythm.     Pulses:          Posterior tibial pulses are 3+ on the right side and 3+ on the left side.     Heart sounds: Normal heart sounds. No murmur heard.    No friction rub. No gallop.  Pulmonary:     Effort: Pulmonary effort is normal. No respiratory distress.     Breath sounds: Normal breath sounds.  Abdominal:     General: Abdomen is flat. Bowel sounds are normal. There is no distension.     Palpations: Abdomen is soft. There is no mass.     Tenderness: There is no abdominal tenderness. There is no right CVA tenderness or left CVA tenderness.   Musculoskeletal:        General: Normal range of motion.     Cervical back: Normal range of motion.     Right lower leg: No edema.     Left lower leg: No edema.  Skin:    General: Skin is warm and dry.  Neurological:     General: No focal deficit present.     Mental Status: She is alert and oriented to person, place, and time. Mental status is at baseline.     Motor: No weakness.     Gait: Gait normal.  Psychiatric:        Mood and Affect: Mood normal.        Behavior: Behavior normal.        Thought Content: Thought content normal.        Judgment: Judgment normal.      Assessment & Plan:  Annual physical exam  Encounter for screening for cardiovascular disorders -     Lipid panel  Screening for endocrine disorder -     TSH  Encounter to establish care -     CBC with Differential/Platelet -     Comprehensive metabolic panel with GFR -     Lipid panel -     TSH   1.Review health maintenance:  -Covid vaccine: Declines  -Hep B vaccine: Unknown  -HIV and Hep C: HIV in [place], and declines screening -AWV-needs visit  -Zoster: Will obtain at [facility]  2.Physical exam completed today. No concerns. 3. Obtained labs (CBC, CMP, Lipid panel-fasting, and TSH). Office will call with results and will be available on MyChart.   Return in about 1 year (around 02/03/2025) for AWV-Dr. Luke or Rojelio, LPN, physical.   Rozlynn Lippold, NP

## 2024-02-04 NOTE — Patient Instructions (Signed)
-  It was nice to meet you and look forward to taking care of you.  -Physical exam completed today. No concerns. -Obtained labs (CBC, CMP, Lipid panel-fasting, and TSH). Office will call with results and will be available on MyChart.  -Follow up in 1 year for a physical.

## 2024-02-19 ENCOUNTER — Other Ambulatory Visit (INDEPENDENT_AMBULATORY_CARE_PROVIDER_SITE_OTHER)

## 2024-02-19 ENCOUNTER — Ambulatory Visit: Payer: Self-pay | Admitting: Family Medicine

## 2024-02-19 DIAGNOSIS — N289 Disorder of kidney and ureter, unspecified: Secondary | ICD-10-CM

## 2024-02-19 LAB — BASIC METABOLIC PANEL WITH GFR
BUN: 17 mg/dL (ref 6–23)
CO2: 24 meq/L (ref 19–32)
Calcium: 9.9 mg/dL (ref 8.4–10.5)
Chloride: 104 meq/L (ref 96–112)
Creatinine, Ser: 1.18 mg/dL (ref 0.40–1.20)
GFR: 53.51 mL/min — ABNORMAL LOW (ref >60.00)
Glucose, Bld: 125 mg/dL — ABNORMAL HIGH (ref 70–99)
Potassium: 3.8 meq/L (ref 3.5–5.1)
Sodium: 139 meq/L (ref 135–145)

## 2024-03-26 DIAGNOSIS — J301 Allergic rhinitis due to pollen: Secondary | ICD-10-CM | POA: Diagnosis not present

## 2024-03-26 DIAGNOSIS — J3089 Other allergic rhinitis: Secondary | ICD-10-CM | POA: Diagnosis not present

## 2024-03-26 DIAGNOSIS — J3081 Allergic rhinitis due to animal (cat) (dog) hair and dander: Secondary | ICD-10-CM | POA: Diagnosis not present

## 2024-03-31 DIAGNOSIS — J3089 Other allergic rhinitis: Secondary | ICD-10-CM | POA: Diagnosis not present

## 2024-03-31 DIAGNOSIS — J3081 Allergic rhinitis due to animal (cat) (dog) hair and dander: Secondary | ICD-10-CM | POA: Diagnosis not present

## 2024-03-31 DIAGNOSIS — J301 Allergic rhinitis due to pollen: Secondary | ICD-10-CM | POA: Diagnosis not present

## 2024-04-09 DIAGNOSIS — J3089 Other allergic rhinitis: Secondary | ICD-10-CM | POA: Diagnosis not present

## 2024-04-09 DIAGNOSIS — J3081 Allergic rhinitis due to animal (cat) (dog) hair and dander: Secondary | ICD-10-CM | POA: Diagnosis not present

## 2024-04-09 DIAGNOSIS — J301 Allergic rhinitis due to pollen: Secondary | ICD-10-CM | POA: Diagnosis not present

## 2024-04-15 DIAGNOSIS — N3941 Urge incontinence: Secondary | ICD-10-CM | POA: Diagnosis not present

## 2024-04-15 DIAGNOSIS — R350 Frequency of micturition: Secondary | ICD-10-CM | POA: Diagnosis not present

## 2024-04-16 DIAGNOSIS — J3089 Other allergic rhinitis: Secondary | ICD-10-CM | POA: Diagnosis not present

## 2024-04-16 DIAGNOSIS — J301 Allergic rhinitis due to pollen: Secondary | ICD-10-CM | POA: Diagnosis not present

## 2024-04-16 DIAGNOSIS — J3081 Allergic rhinitis due to animal (cat) (dog) hair and dander: Secondary | ICD-10-CM | POA: Diagnosis not present

## 2024-04-18 DIAGNOSIS — F1020 Alcohol dependence, uncomplicated: Secondary | ICD-10-CM | POA: Diagnosis not present

## 2024-04-23 DIAGNOSIS — J3089 Other allergic rhinitis: Secondary | ICD-10-CM | POA: Diagnosis not present

## 2024-04-23 DIAGNOSIS — J301 Allergic rhinitis due to pollen: Secondary | ICD-10-CM | POA: Diagnosis not present

## 2024-04-23 DIAGNOSIS — J3081 Allergic rhinitis due to animal (cat) (dog) hair and dander: Secondary | ICD-10-CM | POA: Diagnosis not present

## 2024-04-29 DIAGNOSIS — F900 Attention-deficit hyperactivity disorder, predominantly inattentive type: Secondary | ICD-10-CM | POA: Diagnosis not present

## 2024-04-29 DIAGNOSIS — F1020 Alcohol dependence, uncomplicated: Secondary | ICD-10-CM | POA: Diagnosis not present

## 2024-04-29 DIAGNOSIS — F419 Anxiety disorder, unspecified: Secondary | ICD-10-CM | POA: Diagnosis not present

## 2024-04-29 DIAGNOSIS — F319 Bipolar disorder, unspecified: Secondary | ICD-10-CM | POA: Diagnosis not present

## 2024-04-30 DIAGNOSIS — J3089 Other allergic rhinitis: Secondary | ICD-10-CM | POA: Diagnosis not present

## 2024-04-30 DIAGNOSIS — J301 Allergic rhinitis due to pollen: Secondary | ICD-10-CM | POA: Diagnosis not present

## 2024-04-30 DIAGNOSIS — J3081 Allergic rhinitis due to animal (cat) (dog) hair and dander: Secondary | ICD-10-CM | POA: Diagnosis not present

## 2024-05-07 DIAGNOSIS — J301 Allergic rhinitis due to pollen: Secondary | ICD-10-CM | POA: Diagnosis not present

## 2024-05-07 DIAGNOSIS — J3081 Allergic rhinitis due to animal (cat) (dog) hair and dander: Secondary | ICD-10-CM | POA: Diagnosis not present

## 2024-05-07 DIAGNOSIS — J3089 Other allergic rhinitis: Secondary | ICD-10-CM | POA: Diagnosis not present

## 2024-05-19 DIAGNOSIS — F1020 Alcohol dependence, uncomplicated: Secondary | ICD-10-CM | POA: Diagnosis not present

## 2024-05-20 DIAGNOSIS — N3946 Mixed incontinence: Secondary | ICD-10-CM | POA: Diagnosis not present

## 2024-05-20 DIAGNOSIS — R351 Nocturia: Secondary | ICD-10-CM | POA: Diagnosis not present

## 2024-05-20 DIAGNOSIS — R350 Frequency of micturition: Secondary | ICD-10-CM | POA: Diagnosis not present

## 2024-05-21 DIAGNOSIS — J3081 Allergic rhinitis due to animal (cat) (dog) hair and dander: Secondary | ICD-10-CM | POA: Diagnosis not present

## 2024-05-21 DIAGNOSIS — J3089 Other allergic rhinitis: Secondary | ICD-10-CM | POA: Diagnosis not present

## 2024-05-21 DIAGNOSIS — J301 Allergic rhinitis due to pollen: Secondary | ICD-10-CM | POA: Diagnosis not present

## 2024-05-30 DIAGNOSIS — J3081 Allergic rhinitis due to animal (cat) (dog) hair and dander: Secondary | ICD-10-CM | POA: Diagnosis not present

## 2024-05-30 DIAGNOSIS — J3089 Other allergic rhinitis: Secondary | ICD-10-CM | POA: Diagnosis not present

## 2024-05-30 DIAGNOSIS — J301 Allergic rhinitis due to pollen: Secondary | ICD-10-CM | POA: Diagnosis not present

## 2024-06-03 DIAGNOSIS — N3281 Overactive bladder: Secondary | ICD-10-CM | POA: Diagnosis not present

## 2024-06-05 ENCOUNTER — Telehealth: Payer: Self-pay

## 2024-06-05 NOTE — Telephone Encounter (Signed)
 Copied from CRM [id number]. Topic: General - Other >> Jun 05, 2024 11:00 AM Tysheama G wrote: Reason for CRM: Patient would like if you call her for her AWV instead of video or either dont send the video link through MyChart because she has like 5, so if you could send the link via msg or email [phone]

## 2024-06-06 DIAGNOSIS — J301 Allergic rhinitis due to pollen: Secondary | ICD-10-CM | POA: Diagnosis not present

## 2024-06-06 DIAGNOSIS — J3081 Allergic rhinitis due to animal (cat) (dog) hair and dander: Secondary | ICD-10-CM | POA: Diagnosis not present

## 2024-06-06 DIAGNOSIS — J3089 Other allergic rhinitis: Secondary | ICD-10-CM | POA: Diagnosis not present

## 2024-06-10 ENCOUNTER — Ambulatory Visit: Admitting: Family Medicine

## 2024-06-11 ENCOUNTER — Ambulatory Visit

## 2024-06-16 ENCOUNTER — Ambulatory Visit (INDEPENDENT_AMBULATORY_CARE_PROVIDER_SITE_OTHER)

## 2024-06-16 VITALS — Ht 69.0 in | Wt 145.0 lb

## 2024-06-16 DIAGNOSIS — F1020 Alcohol dependence, uncomplicated: Secondary | ICD-10-CM | POA: Diagnosis not present

## 2024-06-16 DIAGNOSIS — Z0000 Encounter for general adult medical examination without abnormal findings: Secondary | ICD-10-CM

## 2024-06-16 NOTE — Progress Notes (Signed)
 Chief Complaint  Patient presents with   Medicare Wellness     Subjective:   Alicia Logan is a 53 y.o. female who presents for a Medicare Annual Wellness Visit.  Allergies (verified) Singulair [montelukast], Neomycin, Prograf [tacrolimus], and Sulfa antibiotics   History: Past Medical History:  Diagnosis Date   Anxiety    Arthritis    Bipolar 1 disorder (HCC)    Chronic back pain    Chronic diarrhea 08/2020   Depression    Headache    Substance abuse [facility])    Past Surgical History:  Procedure Laterality Date   ARTHROSCOPIC REPAIR PCL     DENTAL SURGERY     KNEE ARTHROSCOPY WITH LATERAL MENISECTOMY Left 04/20/2022   Procedure: KNEE ARTHROSCOPY WITH PARTIAL  lateral MENISECTOMY;  Surgeon: Sharl Selinda Dover, MD;  Location[facility];  Service: Orthopedics;  Laterality: Left;   NASAL SEPTUM SURGERY  07/2017   SPINAL CORD STIMULATOR INSERTION N/A 05/18/2016   Procedure: LUMBAR SPINAL CORD STIMULATOR INSERTION;  Surgeon: Donaciano Sprang, MD;  Location: [facility] OR;  Service: Orthopedics;  Laterality: N/A;   Family History  Problem Relation Age of Onset   Depression Mother    Hypertension Father    Heart failure Father    Stroke Father    Colon cancer Maternal Grandmother    Esophageal cancer Maternal Grandfather    Social History   Occupational History   Occupation: Educational psychologist  Tobacco Use   Smoking status: Former    Current packs/day: 0.50    Average packs/day: 0.5 packs/day for 20.0 years (10.0 ttl pk-yrs)    Types: Cigarettes    Passive exposure: Past   Smokeless tobacco: Never   Tobacco comments:    1/2 pack per day  Vaping Use   Vaping status: Every Day   Substances: Nicotine   Substance and Sexual Activity   Alcohol use: No    Alcohol/week: 5.0 standard drinks of alcohol    Types: 5 Cans of beer per week    Comment: Sober for 7.5 years   Drug use: Yes    Types: Marijuana    Comment: Daily   Sexual activity: Not Currently    Birth  control/protection: Injection, Abstinence   Tobacco Counseling Counseling given: Yes Tobacco comments: 1/2 pack per day  SDOH Screenings   Food Insecurity: No Food Insecurity (06/16/2024)  Housing: Unknown (06/16/2024)  Transportation Needs: No Transportation Needs (06/16/2024)  Utilities: Not At Risk (06/16/2024)  Alcohol Screen: Low Risk  (02/04/2024)  Depression (PHQ2-9): Low Risk  (06/16/2024)  Financial Resource Strain: Medium Risk (05/01/2022)   Received from [facility]  Physical Activity: Sufficiently Active (06/16/2024)  Social Connections: Socially Isolated (06/16/2024)  Stress: Stress Concern Present (06/16/2024)  Tobacco Use: Medium Risk (06/16/2024)  Health Literacy: Adequate Health Literacy (06/16/2024)   See flowsheets for full screening details  Depression Screen PHQ 2 & 9 Depression Scale- Over the past 2 weeks, how often have you been bothered by any of the following problems? Little interest or pleasure in doing things: 0 Feeling down, depressed, or hopeless (PHQ Adolescent also includes...irritable): 0 PHQ-2 Total Score: 0 Trouble falling or staying asleep, or sleeping too much: 0 Feeling tired or having little energy: 0 Poor appetite or overeating (PHQ Adolescent also includes...weight loss): 0 Feeling bad about yourself - or that you are a failure or have let yourself or your family down: 0 Trouble concentrating on things, such as reading the newspaper or watching television (PHQ Adolescent also includes...like school work):  0 Moving or speaking so slowly that other people could have noticed. Or the opposite - being so fidgety or restless that you have been moving around a lot more than usual: 0 Thoughts that you would be better off dead, or of hurting yourself in some way: 0 PHQ-9 Total Score: 0     Goals Addressed               This Visit's Progress     Continue physical activity (pt-stated)        Lose another 5lbs       Visit info /  Clinical Intake: Medicare Wellness Visit Type:: Subsequent Annual Wellness Visit Persons participating in visit:: patient Medicare Wellness Visit Mode:: Video Because this visit was a virtual/telehealth visit:: pt reported vitals If Telephone or Video please confirm:: I connected with the patient using audio enabled telemedicine application and verified that I am speaking with the correct person using two identifiers Patient Location:: Home Provider Location:: Office Information given by:: patient Interpreter Needed?: No Pre-visit prep was completed: no AWV questionnaire completed by patient prior to visit?: no Living arrangements:: with family/others Patient's Overall Health Status Rating: very good Typical amount of pain: some (Lower back pain. Followed by medical attention) Does pain affect daily life?: (!) yes (Followed by medical attention) Are you currently prescribed opioids?: no  Dietary Habits and Nutritional Risks How many meals a day?: (!) 1 (Patient preference) Eats fruit and vegetables daily?: yes Most meals are obtained by: preparing own meals In the last 2 weeks, have you had any of the following?: none Diabetic:: no  Functional Status Activities of Daily Living (to include ambulation/medication): Independent Ambulation: Independent with device- listed below Home Assistive Devices/Equipment: Dentures (specify type); Contact lenses Medication Administration: Independent Home Management: Independent Manage your own finances?: yes Primary transportation is: driving Concerns about vision?: no *vision screening is required for WTM* Concerns about hearing?: no  Fall Screening Falls in the past year?: 0 Number of falls in past year: 0 Was there an injury with Fall?: 0 Fall Risk Category Calculator: 0 Patient Fall Risk Level: Low Fall Risk  Fall Risk Patient at Risk for Falls Due to: No Fall Risks  Home and Transportation Safety: All rugs have non-skid backing?:  N/A, no rugs All stairs or steps have railings?: N/A, no stairs Grab bars in the bathtub or shower?: (!) no Have non-skid surface in bathtub or shower?: yes Good home lighting?: yes Regular seat belt use?: yes Hospital stays in the last year:: no  Cognitive Assessment Difficulty concentrating, remembering, or making decisions? : yes (Followed by medical attention) Will 6CIT or Mini Cog be Completed: no 6CIT or Mini Cog Declined: patient alert, oriented, able to answer questions appropriately and recall recent events  Advance Directives (For Healthcare) Does Patient Have a Medical Advance Directive?: Yes Does patient want to make changes to medical advance directive?: No - Patient declined Type of Advance Directive: Healthcare Power of [place]; Living will Copy of Healthcare Power of Attorney in Chart?: No - copy requested Copy of Living Will in Chart?: No - copy requested  Reviewed/Updated  Reviewed/Updated: Reviewed All (Medical, Surgical, Family, Medications, Allergies, Care Teams, Patient Goals)        Objective:    Today's Vitals   06/16/24 0802  Weight: 145 lb (65.8 kg)  Height: 5' 9 (1.753 m)   Body mass index is 21.41 kg/m.  Current Medications (verified) Outpatient Encounter Medications as of 06/16/2024  Medication Sig   acetaminophen  (TYLENOL )  500 MG tablet Take 1,000 mg by mouth every 6 (six) hours as needed for mild pain or moderate pain.   atomoxetine (STRATTERA) 40 MG capsule Take 80 mg by mouth every morning.   buPROPion  (WELLBUTRIN  XL) 150 MG 24 hr tablet Take 150 mg by mouth every morning.   clonazePAM (KLONOPIN) 1 MG tablet Take 1 mg by mouth 2 (two) times daily as needed.   EPINEPHrine  0.3 mg/0.3 mL IJ SOAJ injection Inject 0.3 mg into the muscle as needed for anaphylaxis.   gabapentin  (NEURONTIN ) 300 MG capsule Take 1 capsule (300 mg total) by mouth 3 (three) times daily.   Lurasidone HCl 120 MG TABS Take 1 tablet by mouth daily.    medroxyPROGESTERone  (DEPO-PROVERA ) 150 MG/ML injection Inject 1 mL (150 mg total) into the muscle every 3 (three) months.   QUEtiapine (SEROQUEL) 25 MG tablet Take 25 mg by mouth at bedtime.   Facility-Administered Encounter Medications as of 06/16/2024  Medication   medroxyPROGESTERone  (DEPO-PROVERA ) injection 150 mg   Hearing/Vision screen Hearing Screening - Comments:: Denies hearing difficulties   Vision Screening - Comments:: Wears rx glasses - up to date with routine eye exams with  [facility] Immunizations and Health Maintenance Health Maintenance  Topic Date Due   HIV Screening  Never done   Hepatitis C Screening  Never done   Hepatitis B Vaccines 19-59 Average Risk (1 of 3 - 19+ 3-dose series) Never done   Pneumococcal Vaccine: 50+ Years (1 of 1 - PCV) Never done   COVID-19 Vaccine (2 - 2025-26 season) 03/24/2024   Zoster Vaccines- Shingrix (2 of 2) 04/24/2024   Mammogram  01/23/2025   Medicare Annual Wellness (AWV)  06/16/2025   DTaP/Tdap/Td (3 - Td or Tdap) 10/18/2026   Cervical Cancer Screening (HPV/Pap Cotest)  10/28/2026   Colonoscopy  04/21/2031   Influenza Vaccine  Completed   HPV VACCINES  Aged Out   Meningococcal B Vaccine  Aged Out        Assessment/Plan:  This is a routine wellness examination for Alicia Logan.  Patient Care Team: Billy Philippe SAUNDERS, NP as PCP - General (Family Medicine) Fleeta Smock, Lamar BROCKS, MD as Consulting Physician (Allergy and Immunology) Coleman, Mayann G, FNP (Family Medicine) Gaston Hamilton, MD as Consulting Physician (Urology) Hoyle Garre, NP as Nurse Practitioner (Nurse Practitioner)  I have personally reviewed and noted the following in the patient's chart:   Medical and social history Use of alcohol, tobacco or illicit drugs  Current medications and supplements including opioid prescriptions. Functional ability and status Nutritional status Physical activity Advanced directives List of other  physicians Hospitalizations, surgeries, and ER visits in previous 12 months Vitals Screenings to include cognitive, depression, and falls Referrals and appointments  No orders of the defined types were placed in this encounter.  In addition, I have reviewed and discussed with patient certain preventive protocols, quality metrics, and best practice recommendations. A written personalized care plan for preventive services as well as general preventive health recommendations were provided to patient.   Rojelio LELON Blush, LPN   [id number]/[id number]   Return in 1 year on 06/22/25  After Visit Summary: (MyChart) Due to this being a telephonic visit, the after visit summary with patients personalized plan was offered to patient via MyChart   Nurse Notes: None

## 2024-06-16 NOTE — Patient Instructions (Addendum)
 Ms. Gulotta,  Thank you for taking the time for your Medicare Wellness Visit. I appreciate your continued commitment to your health goals. Please review the care plan we discussed, and feel free to reach out if I can assist you further.  Please note that Annual Wellness Visits do not include a physical exam. Some assessments may be limited, especially if the visit was conducted virtually. If needed, we may recommend an in-person follow-up with your provider.  Ongoing Care Seeing your primary care provider every 3 to 6 months helps us  monitor your health and provide consistent, personalized care.   Referrals If a referral was made during today's visit and you haven't received any updates within two weeks, please contact the referred provider directly to check on the status.  Recommended Screenings:  Health Maintenance  Topic Date Due   HIV Screening  Never done   Hepatitis C Screening  Never done   Hepatitis B Vaccine (1 of 3 - 19+ 3-dose series) Never done   Pneumococcal Vaccine for age over 84 (1 of 1 - PCV) Never done   COVID-19 Vaccine (2 - 2025-26 season) 03/24/2024   Zoster (Shingles) Vaccine (2 of 2) 04/24/2024   Breast Cancer Screening  01/23/2025   Medicare Annual Wellness Visit  06/16/2025   DTaP/Tdap/Td vaccine (3 - Td or Tdap) 10/18/2026   Pap with HPV screening  10/28/2026   Colon Cancer Screening  04/21/2031   Flu Shot  Completed   HPV Vaccine  Aged Out   Meningitis B Vaccine  Aged Out       06/16/2024    8:05 AM  Advanced Directives  Does Patient Have a Medical Advance Directive? Yes  Type of Estate Agent of [place];Living will  Does patient want to make changes to medical advance directive? No - Patient declined  Copy of Healthcare Power of Attorney in Chart? No - copy requested    Vision: Annual vision screenings are recommended for early detection of glaucoma, cataracts, and diabetic retinopathy. These exams can also reveal signs of  chronic conditions such as diabetes and high blood pressure.  Dental: Annual dental screenings help detect early signs of oral cancer, gum disease, and other conditions linked to overall health, including heart disease and diabetes.  Please see the attached documents for additional preventive care recommendations.

## 2024-06-25 DIAGNOSIS — J3081 Allergic rhinitis due to animal (cat) (dog) hair and dander: Secondary | ICD-10-CM | POA: Diagnosis not present

## 2024-06-25 DIAGNOSIS — J301 Allergic rhinitis due to pollen: Secondary | ICD-10-CM | POA: Diagnosis not present

## 2024-06-25 DIAGNOSIS — J3089 Other allergic rhinitis: Secondary | ICD-10-CM | POA: Diagnosis not present

## 2025-06-22 ENCOUNTER — Ambulatory Visit
